# Patient Record
Sex: Male | Born: 1970 | Race: Black or African American | Hispanic: No | Marital: Married | Smoking: Never smoker
Health system: Southern US, Community
[De-identification: ages and names within clinical notes are randomized; demographics above are authoritative.]

## PROBLEM LIST (undated history)

## (undated) DIAGNOSIS — R51 Headache: Secondary | ICD-10-CM

## (undated) DIAGNOSIS — F419 Anxiety disorder, unspecified: Secondary | ICD-10-CM

## (undated) DIAGNOSIS — I499 Cardiac arrhythmia, unspecified: Secondary | ICD-10-CM

## (undated) DIAGNOSIS — J342 Deviated nasal septum: Secondary | ICD-10-CM

## (undated) DIAGNOSIS — K219 Gastro-esophageal reflux disease without esophagitis: Secondary | ICD-10-CM

## (undated) DIAGNOSIS — H9319 Tinnitus, unspecified ear: Secondary | ICD-10-CM

## (undated) DIAGNOSIS — G4733 Obstructive sleep apnea (adult) (pediatric): Secondary | ICD-10-CM

## (undated) DIAGNOSIS — T8859XA Other complications of anesthesia, initial encounter: Secondary | ICD-10-CM

## (undated) DIAGNOSIS — T4145XA Adverse effect of unspecified anesthetic, initial encounter: Secondary | ICD-10-CM

## (undated) DIAGNOSIS — R002 Palpitations: Secondary | ICD-10-CM

## (undated) DIAGNOSIS — E785 Hyperlipidemia, unspecified: Secondary | ICD-10-CM

## (undated) DIAGNOSIS — F32A Depression, unspecified: Secondary | ICD-10-CM

## (undated) DIAGNOSIS — I10 Essential (primary) hypertension: Secondary | ICD-10-CM

## (undated) DIAGNOSIS — N400 Enlarged prostate without lower urinary tract symptoms: Secondary | ICD-10-CM

## (undated) DIAGNOSIS — R519 Headache, unspecified: Secondary | ICD-10-CM

## (undated) DIAGNOSIS — F329 Major depressive disorder, single episode, unspecified: Secondary | ICD-10-CM

## (undated) DIAGNOSIS — J302 Other seasonal allergic rhinitis: Secondary | ICD-10-CM

## (undated) DIAGNOSIS — G43909 Migraine, unspecified, not intractable, without status migrainosus: Secondary | ICD-10-CM

## (undated) DIAGNOSIS — R32 Unspecified urinary incontinence: Secondary | ICD-10-CM

## (undated) DIAGNOSIS — M1990 Unspecified osteoarthritis, unspecified site: Secondary | ICD-10-CM

## (undated) HISTORY — DX: Other seasonal allergic rhinitis: J30.2

## (undated) HISTORY — DX: Essential (primary) hypertension: I10

## (undated) HISTORY — DX: Hyperlipidemia, unspecified: E78.5

## (undated) HISTORY — DX: Obstructive sleep apnea (adult) (pediatric): G47.33

## (undated) HISTORY — PX: SINOSCOPY: SHX187

## (undated) HISTORY — DX: Gastro-esophageal reflux disease without esophagitis: K21.9

## (undated) HISTORY — DX: Unspecified urinary incontinence: R32

---

## 2016-02-27 ENCOUNTER — Encounter: Payer: Self-pay | Admitting: Primary Care

## 2016-02-27 ENCOUNTER — Ambulatory Visit (INDEPENDENT_AMBULATORY_CARE_PROVIDER_SITE_OTHER): Payer: 59 | Admitting: Primary Care

## 2016-02-27 ENCOUNTER — Other Ambulatory Visit: Payer: Self-pay

## 2016-02-27 VITALS — BP 138/92 | HR 95 | Temp 98.0°F | Ht 67.0 in | Wt 181.0 lb

## 2016-02-27 DIAGNOSIS — K219 Gastro-esophageal reflux disease without esophagitis: Secondary | ICD-10-CM

## 2016-02-27 DIAGNOSIS — I10 Essential (primary) hypertension: Secondary | ICD-10-CM

## 2016-02-27 DIAGNOSIS — M2550 Pain in unspecified joint: Secondary | ICD-10-CM | POA: Insufficient documentation

## 2016-02-27 DIAGNOSIS — N400 Enlarged prostate without lower urinary tract symptoms: Secondary | ICD-10-CM

## 2016-02-27 MED ORDER — DEXLANSOPRAZOLE 60 MG PO CPDR: 60.0000 mg | DELAYED_RELEASE_CAPSULE | Freq: Every day | ORAL | 1 refills | Status: DC

## 2016-02-27 MED ORDER — LISINOPRIL-HYDROCHLOROTHIAZIDE 10-12.5 MG PO TABS: 1.0000 | ORAL_TABLET | Freq: Every day | ORAL | 2 refills | Status: DC

## 2016-02-27 NOTE — Assessment & Plan Note (Signed)
Chronic to neck, shoulders, knees. Takes amitriptyline intermittently. Will continue to monitor.

## 2016-02-27 NOTE — Progress Notes (Signed)
Subjective:    Patient ID: Ronald Burton, male    DOB: 30-Mar-1971, 45 y.o.   MRN: [id number]  HPI  Mr. Ronald Burton is a 45 year old male who presents today to establish care and discuss the problems mentioned below. Will obtain old records.   1) Essential Hypertension: Currently managed on lisinopril-HCTZ 10/12.5 mg. His blood pressure is 138/92 in the office today. He does not regularly check is blood pressure at home. He does experience occasional dizziness, chest pain, and shortness of breath. He's had a cardiac work up including stress test, echocardiogram, and blood work up in [place] which was all negative. He does experience moderate/severe esophageal reflux and is managed on PPI.  2) BPH: Diagnosed 4 years ago. Currently managed on Flomax. He will experience difficulty urinating, urgency,  nocturia and daytime frequency without his medication. He takes his medication most every night.  3) Hyperlipemia: Diagnosed several years ago through a work health screening. Currently managed on Simvastatin. He reports a normal lipid panel in 2017.  4) Neck, Shoulder, and Knee Pain: Chronic. Managed on nortirptlyine at bedtime which he takes occasionally. The nortriptyline works intermittently.  Review of Systems  Eyes: Negative for visual disturbance.  Respiratory: Negative for shortness of breath.   Cardiovascular: Negative for chest pain.  Gastrointestinal:       Moderate to severe GERD.  Genitourinary:       BPH, managed on Flomax.  Musculoskeletal: Positive for arthralgias.  Neurological: Negative for dizziness and headaches.       Past Medical History:  Diagnosis Date  . GERD (gastroesophageal reflux disease)   . Hyperlipidemia   . Hypertension   . OSA (obstructive sleep apnea)   . Seasonal allergies   . Urine incontinence      Social History   Social History  . Marital status: Married    Spouse name: N/A  . Number of children: N/A  . Years of education: N/A    Occupational History  . Not on file.   Social History Main Topics  . Smoking status: Current Some Day Smoker  . Smokeless tobacco: Not on file  . Alcohol use No  . Drug use: Unknown  . Sexual activity: Not on file   Other Topics Concern  . Not on file   Social History Narrative   Married.   6 children, 1 grandchild.   Works as a Art therapistgeneral manager   Enjoys spending time with family, video games.     No past surgical history on file.  Family History  Problem Relation Age of Onset  . Arthritis Mother   . Hyperlipidemia Mother   . Hypertension Mother   . Stroke Mother   . Diabetes Mother   . Stomach cancer Mother   . Hyperlipidemia Father   . Heart disease Father   . Hypertension Father     No Known Allergies  No current outpatient prescriptions on file prior to visit.   No current facility-administered medications on file prior to visit.     BP (!) 138/92   Pulse 95   Temp 98 F (36.7 C) (Oral)   Ht 5\' 7"  (1.702 m)   Wt 181 lb (82.1 kg)   SpO2 98%   BMI 28.35 kg/m    Objective:   Physical Exam  Constitutional: He is oriented to person, place, and time. He appears well-nourished.  Neck: Neck supple.  Cardiovascular: Normal rate and regular rhythm.   Pulmonary/Chest: Effort normal and breath sounds normal. He has  no wheezes. He has no rales.  Neurological: He is alert and oriented to person, place, and time.  Skin: Skin is warm and dry.  Psychiatric: He has a normal mood and affect.          Assessment & Plan:

## 2016-02-27 NOTE — Assessment & Plan Note (Signed)
Managed on Flomax with improvement in symptoms. Continue same.

## 2016-02-27 NOTE — Assessment & Plan Note (Signed)
Moderate. Managed on Dexilant. Will obtain records for endoscopy.

## 2016-02-27 NOTE — Assessment & Plan Note (Signed)
Managed on lisinopril/hctz. BP slightly above goal today, does feel anxious/nervous.  Refill provided today, will continue to monitor. Will obtain old records.

## 2016-02-27 NOTE — Patient Instructions (Signed)
I sent refills of your blood pressure medication to the pharmacy.  It's importance to improve your diet by reducing consumption of fast food, fried food, processed snack foods, sugary drinks. Increase consumption of fresh vegetables and fruits, whole grains, water.  Ensure you are drinking 64 ounces of water daily.  Start exercising. You should be getting 150 minutes of moderate intensity exercise weekly.  I will obtain your records and determine your next follow up.  It was a pleasure to meet you today! Please don't hesitate to call me with any questions. Welcome to [facility]!   DASH Eating Plan DASH stands for "Dietary Approaches to Stop Hypertension." The DASH eating plan is a healthy eating plan that has been shown to reduce high blood pressure (hypertension). Additional health benefits may include reducing the risk of type 2 diabetes mellitus, heart disease, and stroke. The DASH eating plan may also help with weight loss. WHAT DO I NEED TO KNOW ABOUT THE DASH EATING PLAN? For the DASH eating plan, you will follow these general guidelines:  Choose foods with a percent daily value for sodium of less than 5% (as listed on the food label).  Use salt-free seasonings or herbs instead of table salt or sea salt.  Check with your health care provider or pharmacist before using salt substitutes.  Eat lower-sodium products, often labeled as "lower sodium" or "no salt added."  Eat fresh foods.  Eat more vegetables, fruits, and low-fat dairy products.  Choose whole grains. Look for the word "whole" as the first word in the ingredient list.  Choose fish and skinless chicken or [place] more often than red meat. Limit fish, poultry, and meat to 6 oz (170 g) each day.  Limit sweets, desserts, sugars, and sugary drinks.  Choose heart-healthy fats.  Limit cheese to 1 oz (28 g) per day.  Eat more home-cooked food and less restaurant, buffet, and fast food.  Limit fried foods.  Cook foods  using methods other than frying.  Limit canned vegetables. If you do use them, rinse them well to decrease the sodium.  When eating at a restaurant, ask that your food be prepared with less salt, or no salt if possible. WHAT FOODS CAN I EAT? Seek help from a dietitian for individual calorie needs. Grains Whole grain or whole wheat bread. Brown rice. Whole grain or whole wheat pasta. Quinoa, bulgur, and whole grain cereals. Low-sodium cereals. Corn or whole wheat flour tortillas. Whole grain cornbread. Whole grain crackers. Low-sodium crackers. Vegetables Fresh or frozen vegetables (raw, steamed, roasted, or grilled). Low-sodium or reduced-sodium tomato and vegetable juices. Low-sodium or reduced-sodium tomato sauce and paste. Low-sodium or reduced-sodium canned vegetables.  Fruits All fresh, canned (in natural juice), or frozen fruits. Meat and Other Protein Products Ground beef (85% or leaner), grass-fed beef, or beef trimmed of fat. Skinless chicken or [place]. Ground chicken or [place]. Pork trimmed of fat. All fish and seafood. Eggs. Dried beans, peas, or lentils. Unsalted nuts and seeds. Unsalted canned beans. Dairy Low-fat dairy products, such as skim or 1% milk, 2% or reduced-fat cheeses, low-fat ricotta or cottage cheese, or plain low-fat yogurt. Low-sodium or reduced-sodium cheeses. Fats and Oils Tub margarines without trans fats. Light or reduced-fat mayonnaise and salad dressings (reduced sodium). Avocado. Safflower, olive, or canola oils. Natural peanut or almond butter. Other Unsalted popcorn and pretzels. The items listed above may not be a complete list of recommended foods or beverages. Contact your dietitian for more options. WHAT FOODS ARE NOT RECOMMENDED? Grains  White bread. White pasta. White rice. Refined cornbread. Bagels and croissants. Crackers that contain trans fat. Vegetables Creamed or fried vegetables. Vegetables in a cheese sauce. Regular canned vegetables.  Regular canned tomato sauce and paste. Regular tomato and vegetable juices. Fruits Dried fruits. Canned fruit in light or heavy syrup. Fruit juice. Meat and Other Protein Products Fatty cuts of meat. Ribs, chicken wings, bacon, sausage, bologna, salami, chitterlings, fatback, hot dogs, bratwurst, and packaged luncheon meats. Salted nuts and seeds. Canned beans with salt. Dairy Whole or 2% milk, cream, half-and-half, and cream cheese. Whole-fat or sweetened yogurt. Full-fat cheeses or blue cheese. Nondairy creamers and whipped toppings. Processed cheese, cheese spreads, or cheese curds. Condiments Onion and garlic salt, seasoned salt, table salt, and sea salt. Canned and packaged gravies. Worcestershire sauce. Tartar sauce. Barbecue sauce. Teriyaki sauce. Soy sauce, including reduced sodium. Steak sauce. Fish sauce. Oyster sauce. Cocktail sauce. Horseradish. Ketchup and mustard. Meat flavorings and tenderizers. Bouillon cubes. Hot sauce. Tabasco sauce. Marinades. Taco seasonings. Relishes. Fats and Oils Butter, stick margarine, lard, shortening, ghee, and bacon fat. Coconut, palm kernel, or palm oils. Regular salad dressings. Other Pickles and olives. Salted popcorn and pretzels. The items listed above may not be a complete list of foods and beverages to avoid. Contact your dietitian for more information. WHERE CAN I FIND MORE INFORMATION? National Heart, Lung, and Blood Institute: [employer].itwww.nhlbi.nih.gov/health/health-topics/topics/dash/   This information is not intended to replace advice given to you by your health care provider. Make sure you discuss any questions you have with your health care provider.   Document Released: 04/17/2011 Document Revised: 05/19/2014 Document Reviewed: 03/02/2013 Elsevier Interactive Patient Education [employer]2016 Elsevier Inc.

## 2016-02-27 NOTE — Telephone Encounter (Signed)
Misty StanleyLisa DPR signed left v/m that pt was seen today and pt forgot to request refill dexilant to [facility]. Mayra ReelKate Clark NP has not prescribed before.Please advise. Lisa request cb when done.

## 2016-02-27 NOTE — Progress Notes (Signed)
Pre visit review using our clinic review tool, if applicable. No additional management support is needed unless otherwise documented below in the visit note. 

## 2016-03-03 NOTE — Telephone Encounter (Signed)
Spoken to patient's spouse on 02/29/2016 and was notified that prescription has been refilled.

## 2016-03-04 ENCOUNTER — Other Ambulatory Visit: Payer: Self-pay | Admitting: Primary Care

## 2016-03-04 ENCOUNTER — Telehealth: Payer: Self-pay

## 2016-03-04 DIAGNOSIS — K219 Gastro-esophageal reflux disease without esophagitis: Secondary | ICD-10-CM

## 2016-03-04 DIAGNOSIS — I10 Essential (primary) hypertension: Secondary | ICD-10-CM

## 2016-03-04 MED ORDER — LISINOPRIL-HYDROCHLOROTHIAZIDE 10-12.5 MG PO TABS: 1.0000 | ORAL_TABLET | Freq: Every day | ORAL | 1 refills | Status: DC

## 2016-03-04 MED ORDER — DEXLANSOPRAZOLE 60 MG PO CPDR: 60.0000 mg | DELAYED_RELEASE_CAPSULE | Freq: Every day | ORAL | 1 refills | Status: DC

## 2016-03-04 NOTE — Telephone Encounter (Signed)
Received faxed refill request for dexlansoprazole (DEXILANT) 60 MG capsule and lisinopril-hydrochlorothiazide (PRINZIDE,ZESTORETIC) 10-12.5 to change to mail order.  Sent medication as requested.

## 2016-03-04 NOTE — Telephone Encounter (Signed)
Ronald Burton with [facility] R/X calls regarding clarification on R/X's sent in by Vernona RiegerKatherine Clark NP.    In order to fill the Lis/hctz and Dexilant for patient they must have the name of NP's collaborating physician on file.  Information (dr. Kerby NoraAmy Bedsole) provided by this writer and r/x's are being filled.

## 2016-03-27 ENCOUNTER — Telehealth: Payer: Self-pay | Admitting: Primary Care

## 2016-03-27 NOTE — Telephone Encounter (Signed)
Patient advised.

## 2016-03-27 NOTE — Telephone Encounter (Signed)
Please notify patient that I received his records and he's due for his annual physical anytime now. Please schedule this at his convenience.

## 2016-05-29 ENCOUNTER — Other Ambulatory Visit: Payer: Self-pay | Admitting: Primary Care

## 2016-05-29 DIAGNOSIS — I10 Essential (primary) hypertension: Secondary | ICD-10-CM

## 2016-05-29 DIAGNOSIS — K219 Gastro-esophageal reflux disease without esophagitis: Secondary | ICD-10-CM

## 2016-06-05 NOTE — Telephone Encounter (Signed)
Sending letter with results and Kate's comments for patient.  Message left for patient to return my call.

## 2016-08-22 ENCOUNTER — Ambulatory Visit (INDEPENDENT_AMBULATORY_CARE_PROVIDER_SITE_OTHER): Payer: 59 | Admitting: Podiatry

## 2016-08-22 ENCOUNTER — Encounter: Payer: Self-pay | Admitting: Podiatry

## 2016-08-22 DIAGNOSIS — B351 Tinea unguium: Secondary | ICD-10-CM

## 2016-08-22 NOTE — Progress Notes (Signed)
   Subjective:    Patient ID: Ronald Burton, male    DOB: 12/22/1970, 46 y.o.   MRN: [id number]  HPI  Chief Complaint  Patient presents with  . Nail Problem    Bilateral; great toe and 5th toes; nail discoloration & thick nails; pt stated, "needs nails checked for nail fungus"; x2 yrs     Review of Systems  Musculoskeletal: Positive for arthralgias.  All other systems reviewed and are negative.      Objective:   Physical Exam        Assessment & Plan:

## 2016-08-24 NOTE — Progress Notes (Signed)
Subjective:     Patient ID: Ronald Burton, male   DOB: Jul 31, 1970, 46 y.o.   MRN: [id number]  HPI patient presents with significant nail disease bilateral hallux and several other nails that are dystrophic and thick   Review of Systems  All other systems reviewed and are negative.      Objective:   Physical Exam  Constitutional: He is oriented to person, place, and time.  Musculoskeletal: Normal range of motion.  Neurological: He is oriented to person, place, and time.  Skin: Skin is warm and dry.  Nursing note and vitals reviewed.  neurovascular status found to be intact with muscle strength adequate range of motion within normal limits. Patient's found to have nail disease especially the hallux bilateral that are thick and dystrophic when palpated. 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 I then recommended we do a culture and we did carefully debride nailbeds hallux bilateral and I'm sending it for culture and biopsy. I then discussed the possibilities for oral topical and laser treatment depending on response to culture

## 2016-09-19 ENCOUNTER — Ambulatory Visit (INDEPENDENT_AMBULATORY_CARE_PROVIDER_SITE_OTHER): Payer: 59 | Admitting: Podiatry

## 2016-09-19 DIAGNOSIS — B351 Tinea unguium: Secondary | ICD-10-CM

## 2016-09-19 MED ORDER — TERBINAFINE HCL 250 MG PO TABS: 250.0000 mg | ORAL_TABLET | Freq: Every day | ORAL | 0 refills | Status: DC

## 2016-09-19 NOTE — Progress Notes (Signed)
Subjective:    Patient ID: Ronald Burton, male   DOB: 46 y.o.   MRN: [id number]   HPI patient has changes in his hallux nails bilateral and fifth nails bilateral that are significant and he had cultures done    ROS      Objective:  Physical Exam Neurovascular status intact with patient found have positive cultures indicating fungal component to the deformity of the nailbeds that he has    Assessment:    Combination of mycosis with probable trauma to the nailbeds     Plan:     H&P and condition reviewed at great length. At this point I do think aggressive oral therapy along with topical therapy and laser is the best chance he has of improving and I reviewed what would be necessary. Patient wants to undergo this and he is scheduled for laser therapy of at least 4 treatments is placed on 90 days Lamisil will have liver function studies done and also we will begin topical. No guarantee this will solve his problem

## 2016-11-26 ENCOUNTER — Other Ambulatory Visit: Payer: Self-pay | Admitting: Primary Care

## 2016-11-26 DIAGNOSIS — E559 Vitamin D deficiency, unspecified: Secondary | ICD-10-CM

## 2016-11-26 DIAGNOSIS — E785 Hyperlipidemia, unspecified: Secondary | ICD-10-CM

## 2016-12-01 ENCOUNTER — Other Ambulatory Visit (INDEPENDENT_AMBULATORY_CARE_PROVIDER_SITE_OTHER): Payer: 59

## 2016-12-01 DIAGNOSIS — E785 Hyperlipidemia, unspecified: Secondary | ICD-10-CM

## 2016-12-01 DIAGNOSIS — E559 Vitamin D deficiency, unspecified: Secondary | ICD-10-CM | POA: Diagnosis not present

## 2016-12-01 LAB — COMPREHENSIVE METABOLIC PANEL
ALT: 24 U/L (ref 0–53)
AST: 16 U/L (ref 0–37)
Albumin: 4.3 g/dL (ref 3.5–5.2)
Alkaline Phosphatase: 73 U/L (ref 39–117)
BILIRUBIN TOTAL: 1.1 mg/dL (ref 0.2–1.2)
BUN: 8 mg/dL (ref 6–23)
CALCIUM: 9.7 mg/dL (ref 8.4–10.5)
CHLORIDE: 105 meq/L (ref 96–112)
CO2: 26 mEq/L (ref 19–32)
Creatinine, Ser: 1.03 mg/dL (ref 0.40–1.50)
GFR: 99.28 mL/min (ref >60.00)
GLUCOSE: 107 mg/dL — AB (ref 70–99)
POTASSIUM: 3.7 meq/L (ref 3.5–5.1)
Sodium: 137 mEq/L (ref 135–145)
Total Protein: 7 g/dL (ref 6.0–8.3)

## 2016-12-01 LAB — LIPID PANEL
CHOL/HDL RATIO: 3
CHOLESTEROL: 129 mg/dL (ref 0–200)
HDL: 41.2 mg/dL (ref >39.00)
LDL CALC: 77 mg/dL (ref 0–99)
NonHDL: 87.37
TRIGLYCERIDES: 54 mg/dL (ref 0.0–149.0)
VLDL: 10.8 mg/dL (ref 0.0–40.0)

## 2016-12-01 LAB — VITAMIN D 25 HYDROXY (VIT D DEFICIENCY, FRACTURES): VITD: 27.95 ng/mL — ABNORMAL LOW (ref 30.00–100.00)

## 2016-12-01 LAB — HEMOGLOBIN A1C: Hgb A1c MFr Bld: 5.3 % (ref 4.6–6.5)

## 2016-12-04 ENCOUNTER — Encounter: Payer: 59 | Admitting: Primary Care

## 2016-12-24 ENCOUNTER — Ambulatory Visit (INDEPENDENT_AMBULATORY_CARE_PROVIDER_SITE_OTHER): Payer: 59 | Admitting: Primary Care

## 2016-12-24 ENCOUNTER — Encounter: Payer: Self-pay | Admitting: Primary Care

## 2016-12-24 VITALS — BP 124/94 | HR 81 | Temp 97.9°F | Ht 67.0 in | Wt 179.1 lb

## 2016-12-24 DIAGNOSIS — Z0000 Encounter for general adult medical examination without abnormal findings: Secondary | ICD-10-CM | POA: Insufficient documentation

## 2016-12-24 DIAGNOSIS — N400 Enlarged prostate without lower urinary tract symptoms: Secondary | ICD-10-CM | POA: Diagnosis not present

## 2016-12-24 DIAGNOSIS — K219 Gastro-esophageal reflux disease without esophagitis: Secondary | ICD-10-CM | POA: Diagnosis not present

## 2016-12-24 DIAGNOSIS — I10 Essential (primary) hypertension: Secondary | ICD-10-CM

## 2016-12-24 DIAGNOSIS — J309 Allergic rhinitis, unspecified: Secondary | ICD-10-CM

## 2016-12-24 DIAGNOSIS — M2550 Pain in unspecified joint: Secondary | ICD-10-CM

## 2016-12-24 DIAGNOSIS — E785 Hyperlipidemia, unspecified: Secondary | ICD-10-CM | POA: Insufficient documentation

## 2016-12-24 NOTE — Assessment & Plan Note (Signed)
Stable on Dexilant, also taking Zantac HS. Continue same.

## 2016-12-24 NOTE — Assessment & Plan Note (Addendum)
Recently initiated on Singulair, Claritin, and Flonase per [facility].

## 2016-12-24 NOTE — Assessment & Plan Note (Signed)
Immunizations UTD. Recommended regular exercise, increase veggies/fruit/whole grains, water. Exam unremarkable. Labs unremarkable. Follow up in 1 year.

## 2016-12-24 NOTE — Assessment & Plan Note (Signed)
Managed on tamsulosin PRN through the [facility].

## 2016-12-24 NOTE — Progress Notes (Signed)
Subjective:    Patient ID: Ronald Burton, male    DOB: 02/25/1971, 46 y.o.   MRN: [id number]  HPI  Ronald Burton is a 46 year old male who presents today for complete physical. Following with [facility] [place]   Immunizations: -Tetanus: Completed in 2015 -Influenza: Due this season   Diet: He endorses a fair diet. Breakfast: Skips Lunch: Fast food Dinner: Meat, vegetable, salad, starch Snacks: Crackers Desserts: Chocolate (daily). Beverages: Coffee, occasional soda, water  Exercise: He does not currently exercise.  Eye exam: Completed in 2017. Dental exam: Completes annually   Review of Systems  Constitutional: Negative for unexpected weight change.  HENT: Negative for rhinorrhea.   Respiratory: Negative for cough and shortness of breath.   Cardiovascular: Negative for chest pain.  Gastrointestinal: Negative for constipation and diarrhea.  Genitourinary: Negative for difficulty urinating.  Musculoskeletal: Positive for arthralgias. Negative for myalgias.       Chronic arthralgias to knees and neck  Skin: Negative for rash.  Allergic/Immunologic: Negative for environmental allergies.  Neurological: Negative for dizziness, numbness and headaches.  Psychiatric/Behavioral:       [facility]ased [facility]. Increased stress with work and ill family member.       Past Medical History:  Diagnosis Date  . GERD (gastroesophageal reflux disease)   . Hyperlipidemia   . Hypertension   . OSA (obstructive sleep apnea)   . Seasonal allergies   . Urine incontinence      Social History   Social History  . Marital status: Married    Spouse name: N/A  . Number of children: N/A  . Years of education: N/A   Occupational History  . Not on file.   Social History Main Topics  . Smoking status: Current Some Day Smoker  . Smokeless tobacco: Current User    Types: Chew  . Alcohol use No  . Drug use: Unknown  . Sexual activity: Not on file   Other Topics Concern  . Not  on file   Social History Narrative   Married.   6 children, 1 grandchild.   Works as a Art therapist   Enjoys spending time with family, video games.     No past surgical history on file.  Family History  Problem Relation Age of Onset  . Arthritis Mother   . Hyperlipidemia Mother   . Hypertension Mother   . Stroke Mother   . Diabetes Mother   . Stomach cancer Mother   . Hyperlipidemia Father   . Heart disease Father   . Hypertension Father     No Known Allergies  Current Outpatient Prescriptions on File Prior to Visit  Medication Sig Dispense Refill  . DEXILANT 60 MG capsule TAKE 1 CAPSULE BY MOUTH  DAILY 90 capsule 1  . diclofenac sodium (VOLTAREN) 1 % GEL Apply 2 g topically daily.     . fluticasone (FLONASE) 50 MCG/ACT nasal spray Place 1 spray into both nostrils daily.    . simvastatin (ZOCOR) 20 MG tablet Take 20 mg by mouth daily.    Marland Kitchen terbinafine (LAMISIL) 250 MG tablet Take 1 tablet (250 mg total) by mouth daily. 90 tablet 0  . lisinopril-hydrochlorothiazide (PRINZIDE,ZESTORETIC) 10-12.5 MG tablet TAKE 1 TABLET BY MOUTH  DAILY (Patient not taking: Reported on 12/24/2016) 90 tablet 1   No current facility-administered medications on file prior to visit.     BP (!) 124/94   Pulse 81   Temp 97.9 F (36.6 C) (Oral)   Ht 5'  7" (1.702 m)   Wt 179 lb 1.9 oz (81.2 kg)   SpO2 98%   BMI 28.05 kg/m    Objective:   Physical Exam  Constitutional: He is oriented to person, place, and time. He appears well-nourished.  HENT:  Right Ear: Tympanic membrane and ear canal normal.  Left Ear: Tympanic membrane and ear canal normal.  Nose: Nose normal. Right sinus exhibits no maxillary sinus tenderness and no frontal sinus tenderness. Left sinus exhibits no maxillary sinus tenderness and no frontal sinus tenderness.  Mouth/Throat: Oropharynx is clear and moist.  Eyes: Pupils are equal, round, and reactive to light. Conjunctivae and EOM are normal.  Neck: Neck supple.  Carotid bruit is not present. No thyromegaly present.  Cardiovascular: Normal rate, regular rhythm and normal heart sounds.   Pulmonary/Chest: Effort normal and breath sounds normal. He has no wheezes. He has no rales.  Abdominal: Soft. Bowel sounds are normal. There is no tenderness.  Musculoskeletal: Normal range of motion.  Neurological: He is alert and oriented to person, place, and time. He has normal reflexes. No cranial nerve deficit.  Skin: Skin is warm and dry.  Psychiatric: He has a normal mood and affect.          Assessment & Plan:

## 2016-12-24 NOTE — Patient Instructions (Signed)
It's important to improve your diet by reducing consumption of fast food, fried food, processed snack foods, sugary drinks. Increase consumption of fresh vegetables and fruits, whole grains, water.  Ensure you are drinking 64 ounces of water daily.  Start exercising. You should be getting 150 minutes of moderate intensity exercise weekly.  Follow up in 1 year for your annual physical or sooner if needed.  It was a pleasure to see you today!  

## 2016-12-24 NOTE — Assessment & Plan Note (Signed)
Stable on simvastatin 20 mg. Recent lipids unremarkable. Recommended regular exercise and improvement on diet.

## 2016-12-24 NOTE — Assessment & Plan Note (Signed)
Increased tension to neck and shoulders, likely secondary to stress. He will be working on stress reduction through [facility].

## 2016-12-24 NOTE — Assessment & Plan Note (Signed)
Has had several low BP readings. He plans on working on diet and exercise. Recommended we separate his lisinopril-HCTZ, he declines as he'd like to stay off and work on weight loss. He will monitor his BP.

## 2017-01-13 ENCOUNTER — Encounter: Payer: Self-pay | Admitting: Emergency Medicine

## 2017-01-13 DIAGNOSIS — R079 Chest pain, unspecified: Secondary | ICD-10-CM | POA: Insufficient documentation

## 2017-01-13 DIAGNOSIS — R531 Weakness: Secondary | ICD-10-CM | POA: Insufficient documentation

## 2017-01-13 DIAGNOSIS — R0602 Shortness of breath: Secondary | ICD-10-CM | POA: Insufficient documentation

## 2017-01-13 DIAGNOSIS — Z5321 Procedure and treatment not carried out due to patient leaving prior to being seen by health care provider: Secondary | ICD-10-CM | POA: Diagnosis not present

## 2017-01-13 DIAGNOSIS — R42 Dizziness and giddiness: Secondary | ICD-10-CM | POA: Diagnosis not present

## 2017-01-13 DIAGNOSIS — R110 Nausea: Secondary | ICD-10-CM | POA: Diagnosis not present

## 2017-01-13 MED ORDER — ONDANSETRON 4 MG PO TBDP
4.0000 mg | ORAL_TABLET | Freq: Once | ORAL | Status: AC
  Administered 2017-01-13: 4 mg via ORAL
  Filled 2017-01-13: qty 1

## 2017-01-13 NOTE — ED Triage Notes (Signed)
Patient to ER for c/o chest pain with radiating to left arm and jaw that started tonight. Also reports shortness of breath, dizziness, weakness, and nausea. Denies any recent illness.

## 2017-01-14 ENCOUNTER — Emergency Department: Payer: 59

## 2017-01-14 ENCOUNTER — Other Ambulatory Visit: Payer: Self-pay

## 2017-01-14 ENCOUNTER — Telehealth: Payer: Self-pay | Admitting: Emergency Medicine

## 2017-01-14 ENCOUNTER — Encounter (HOSPITAL_COMMUNITY): Payer: Self-pay | Admitting: *Deleted

## 2017-01-14 ENCOUNTER — Ambulatory Visit: Payer: 59 | Admitting: Family Medicine

## 2017-01-14 ENCOUNTER — Emergency Department (HOSPITAL_COMMUNITY)
Admission: EM | Admit: 2017-01-14 | Discharge: 2017-01-14 | Disposition: A | Discharge status: Home / Self Care | Payer: 59 | Attending: Emergency Medicine | Admitting: Emergency Medicine

## 2017-01-14 ENCOUNTER — Emergency Department
Admission: EM | Admit: 2017-01-14 | Discharge: 2017-01-14 | Disposition: A | Discharge status: Home / Self Care | Payer: 59 | Attending: Emergency Medicine | Admitting: Emergency Medicine

## 2017-01-14 ENCOUNTER — Telehealth: Payer: Self-pay | Admitting: Primary Care

## 2017-01-14 DIAGNOSIS — E785 Hyperlipidemia, unspecified: Secondary | ICD-10-CM | POA: Diagnosis not present

## 2017-01-14 DIAGNOSIS — Z79899 Other long term (current) drug therapy: Secondary | ICD-10-CM | POA: Insufficient documentation

## 2017-01-14 DIAGNOSIS — R072 Precordial pain: Secondary | ICD-10-CM | POA: Diagnosis not present

## 2017-01-14 DIAGNOSIS — R079 Chest pain, unspecified: Secondary | ICD-10-CM | POA: Diagnosis present

## 2017-01-14 DIAGNOSIS — Z7982 Long term (current) use of aspirin: Secondary | ICD-10-CM | POA: Insufficient documentation

## 2017-01-14 DIAGNOSIS — R42 Dizziness and giddiness: Secondary | ICD-10-CM

## 2017-01-14 DIAGNOSIS — R110 Nausea: Secondary | ICD-10-CM | POA: Diagnosis not present

## 2017-01-14 DIAGNOSIS — R55 Syncope and collapse: Secondary | ICD-10-CM | POA: Diagnosis not present

## 2017-01-14 DIAGNOSIS — F17200 Nicotine dependence, unspecified, uncomplicated: Secondary | ICD-10-CM | POA: Insufficient documentation

## 2017-01-14 DIAGNOSIS — I10 Essential (primary) hypertension: Secondary | ICD-10-CM | POA: Insufficient documentation

## 2017-01-14 HISTORY — DX: Cardiac arrhythmia, unspecified: I49.9

## 2017-01-14 LAB — COMPREHENSIVE METABOLIC PANEL
ALBUMIN: 4.1 g/dL (ref 3.5–5.0)
ALK PHOS: 77 U/L (ref 38–126)
ALT: 20 U/L (ref 17–63)
ANION GAP: 9 (ref 5–15)
AST: 19 U/L (ref 15–41)
BUN: 8 mg/dL (ref 6–20)
CALCIUM: 9.4 mg/dL (ref 8.9–10.3)
CO2: 25 mmol/L (ref 22–32)
Chloride: 104 mmol/L (ref 101–111)
Creatinine, Ser: <0.3 mg/dL — ABNORMAL LOW (ref 0.61–1.24)
GLUCOSE: 93 mg/dL (ref 65–99)
Potassium: 4.1 mmol/L (ref 3.5–5.1)
SODIUM: 138 mmol/L (ref 135–145)
Total Bilirubin: 1.2 mg/dL (ref 0.3–1.2)
Total Protein: 7 g/dL (ref 6.5–8.1)

## 2017-01-14 LAB — BASIC METABOLIC PANEL
ANION GAP: 7 (ref 5–15)
BUN: 9 mg/dL (ref 6–20)
CALCIUM: 9.4 mg/dL (ref 8.9–10.3)
CHLORIDE: 106 mmol/L (ref 101–111)
CO2: 25 mmol/L (ref 22–32)
Creatinine, Ser: 1.1 mg/dL (ref 0.61–1.24)
GFR calc Af Amer: >60 mL/min (ref >60)
GFR calc non Af Amer: >60 mL/min (ref >60)
GLUCOSE: 130 mg/dL — AB (ref 65–99)
Potassium: 3.6 mmol/L (ref 3.5–5.1)
Sodium: 138 mmol/L (ref 135–145)

## 2017-01-14 LAB — CBC WITH DIFFERENTIAL/PLATELET
BASOS ABS: 0 10*3/uL (ref 0.0–0.1)
BASOS PCT: 0 %
EOS ABS: 0.5 10*3/uL (ref 0.0–0.7)
Eosinophils Relative: 4 %
HEMATOCRIT: 44.7 % (ref 39.0–52.0)
HEMOGLOBIN: 15.7 g/dL (ref 13.0–17.0)
Lymphocytes Relative: 26 %
Lymphs Abs: 3.3 10*3/uL (ref 0.7–4.0)
MCH: 28.3 pg (ref 26.0–34.0)
MCHC: 35.1 g/dL (ref 30.0–36.0)
MCV: 80.5 fL (ref 78.0–100.0)
MONOS PCT: 8 %
Monocytes Absolute: 1 10*3/uL (ref 0.1–1.0)
NEUTROS ABS: 7.8 10*3/uL — AB (ref 1.7–7.7)
NEUTROS PCT: 62 %
Platelets: 282 10*3/uL (ref 150–400)
RBC: 5.55 MIL/uL (ref 4.22–5.81)
RDW: 13.1 % (ref 11.5–15.5)
WBC: 12.6 10*3/uL — AB (ref 4.0–10.5)

## 2017-01-14 LAB — I-STAT TROPONIN, ED: TROPONIN I, POC: 0.01 ng/mL (ref 0.00–0.08)

## 2017-01-14 LAB — CBC
HEMATOCRIT: 43.5 % (ref 40.0–52.0)
HEMOGLOBIN: 15 g/dL (ref 13.0–18.0)
MCH: 28 pg (ref 26.0–34.0)
MCHC: 34.5 g/dL (ref 32.0–36.0)
MCV: 81 fL (ref 80.0–100.0)
Platelets: 291 10*3/uL (ref 150–440)
RBC: 5.37 MIL/uL (ref 4.40–5.90)
RDW: 13.3 % (ref 11.5–14.5)
WBC: 15.4 10*3/uL — ABNORMAL HIGH (ref 3.8–10.6)

## 2017-01-14 LAB — TROPONIN I: Troponin I: <0.03 ng/mL (ref <0.03)

## 2017-01-14 MED ORDER — ONDANSETRON HCL 4 MG/2ML IJ SOLN
4.0000 mg | Freq: Once | INTRAMUSCULAR | Status: AC
  Administered 2017-01-14: 4 mg via INTRAVENOUS
  Filled 2017-01-14: qty 2

## 2017-01-14 MED ORDER — SODIUM CHLORIDE 0.9 % IV SOLN
INTRAVENOUS | Status: DC
Start: 2017-01-14 — End: 2017-01-14

## 2017-01-14 MED ORDER — MECLIZINE HCL 25 MG PO TABS
25.0000 mg | ORAL_TABLET | Freq: Once | ORAL | Status: AC
  Administered 2017-01-14: 25 mg via ORAL
  Filled 2017-01-14: qty 1

## 2017-01-14 MED ORDER — MECLIZINE HCL 25 MG PO TABS: 25.0000 mg | ORAL_TABLET | Freq: Three times a day (TID) | ORAL | 0 refills | Status: DC | PRN

## 2017-01-14 MED ORDER — PROMETHAZINE HCL 25 MG PO TABS
25.0000 mg | ORAL_TABLET | Freq: Four times a day (QID) | ORAL | 1 refills | Status: DC | PRN
Start: 2017-01-14 — End: 2018-08-23

## 2017-01-14 MED ORDER — ONDANSETRON 4 MG PO TBDP
4.0000 mg | ORAL_TABLET | Freq: Three times a day (TID) | ORAL | 1 refills | Status: DC | PRN
Start: 2017-01-14 — End: 2018-06-08

## 2017-01-14 MED ORDER — SODIUM CHLORIDE 0.9 % IV BOLUS (SEPSIS)
1000.0000 mL | Freq: Once | INTRAVENOUS | Status: AC
  Administered 2017-01-14: 1000 mL via INTRAVENOUS

## 2017-01-14 NOTE — Telephone Encounter (Signed)
Reviewed chart, looks like troponin negative. Chest xray with "mild bronchitic changes". Given WBC count and xray result he could have bacterial respiratory involvement. Will have patient evaluated by MD in the office today as he needs examination.

## 2017-01-14 NOTE — Telephone Encounter (Signed)
While working with providers to determine most appropriate evaluation plan for patient since initial ER refusal, I notice in EPIC that patient has arrived back at ER at 1:16pm today per [facility]'s original recommendation.

## 2017-01-14 NOTE — Telephone Encounter (Signed)
Strongly recommended evaluation by anyone rather ED or in our office. There is a time held for evaluation at 5 pm in our office. Our RN leader is working to get him seen today.

## 2017-01-14 NOTE — ED Notes (Signed)
Patient verbalized understanding of discharge instructions and denies any further needs or questions at this time. VS stable. Patient ambulatory with steady gait, states his dizziness had improved. RN escorted patient to ED entrance.

## 2017-01-14 NOTE — Telephone Encounter (Signed)
spouse called for an ER follow up appointment at our office [facility] today at 5:00 pm with Dr, Reece AgarG.   Since patient was not actually seen at the ER, checked with triage and I called pt to ask if he would please speak with [facility] and be triaged for SOB and chest pain.  Patient agreed to speak with [facility].  Canceled appointment with our office per policy until after he has been triaged.  Placed a hold on time for him if needed.  Best number to call patient is [phone]

## 2017-01-14 NOTE — ED Triage Notes (Addendum)
Pt in c/o mid radiating CP, pain goes to the neck, pt reports nausea, denies, v/d., pt reports hx of irregular heart beat, pt c/o SOB, pt c/o dizziness, A&O x4, pt seen at [facility] for same complaint last night & LWBS after triage

## 2017-01-14 NOTE — Discharge Instructions (Signed)
Take either the Phenergan or the Zofran for the nausea. Take the antivert for the dizziness. 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 For the chest pain although workup was negative here recommend follow-up with cardiology.: Make an appointment. Return for any new or worse symptoms.

## 2017-01-14 NOTE — Telephone Encounter (Signed)
Per chart review tab pt went to [facility] ED today. 

## 2017-01-14 NOTE — ED Provider Notes (Signed)
[facility]-EMERGENCY DEPT Provider Note   CSN: [id number] Arrival date & time: 01/14/17  1319     History   Chief Complaint Chief Complaint  Patient presents with  . Chest Pain    HPI Ronald Burton is a 46 y.o. male.  A patient with onset of substernal chest pain yesterday afternoon. It persisted into today. Patient went to [facility] for evaluation did have lab work done there but the wait was too long so he had a leave for work. Today at work patient was feeling bad symptoms have included dizziness without true room spinning feeling like his current a pass out nausea but no vomiting. Some slight shortness of breath but no severe shortness of breath. No history of similar symptoms in the past. No upper respiratory symptoms. No vomiting no diarrhea. No actual syncope.  Chest pain is substernal radiates to the left neck and left arm associated with some tingling and numbness.      Past Medical History:  Diagnosis Date  . GERD (gastroesophageal reflux disease)   . Hyperlipidemia   . Hypertension   . Irregular heart beat   . OSA (obstructive sleep apnea)   . Seasonal allergies   . Urine incontinence     Patient Active Problem List   Diagnosis Date Noted  . Allergic rhinitis 12/24/2016  . Hyperlipidemia 12/24/2016  . Preventative health care 12/24/2016  . Essential hypertension 02/27/2016  . BPH (benign prostatic hyperplasia) 02/27/2016  . Arthralgia 02/27/2016  . GERD (gastroesophageal reflux disease) 02/27/2016    History reviewed. No pertinent surgical history.     Home Medications    Prior to Admission medications   Medication Sig Start Date End Date Taking? Authorizing Provider  aspirin EC 81 MG tablet Take 81 mg by mouth daily.    Yes [provider]  cetirizine (ZYRTEC) 10 MG tablet Take 10 mg by mouth daily.   Yes [provider]  DEXILANT 60 MG capsule TAKE 1 CAPSULE BY MOUTH  DAILY Patient taking differently: Take 60 mg by  mouth once a day 05/30/16  Yes Doreene Nest, NP  diclofenac sodium (VOLTAREN) 1 % GEL Apply 2-4 g topically daily as needed (for knee pain).  06/27/16  Yes [provider]  fluticasone (FLONASE) 50 MCG/ACT nasal spray Place 1 spray into both nostrils 2 (two) times daily as needed for allergies or rhinitis.    Yes [provider]  montelukast (SINGULAIR) 10 MG tablet Take 10 mg by mouth at bedtime.   Yes [provider]  simvastatin (ZOCOR) 20 MG tablet Take 20 mg by mouth every evening.    Yes [provider]  topiramate (TOPAMAX) 25 MG tablet Take 25 mg by mouth daily as needed (at onset of headaches).    Yes [provider]  UNABLE TO FIND CPAP: At bedtime   Yes [provider]  lisinopril-hydrochlorothiazide (PRINZIDE,ZESTORETIC) 10-12.5 MG tablet TAKE 1 TABLET BY MOUTH  DAILY Patient not taking: Reported on 01/14/2017 05/30/16   Doreene Nest, NP  meclizine (ANTIVERT) 25 MG tablet Take 1 tablet (25 mg total) by mouth 3 (three) times daily as needed for dizziness. 01/14/17   Vanetta Mulders, MD  ondansetron (ZOFRAN ODT) 4 MG disintegrating tablet Take 1 tablet (4 mg total) by mouth every 8 (eight) hours as needed for nausea or vomiting. 01/14/17   Vanetta Mulders, MD  promethazine (PHENERGAN) 25 MG tablet Take 1 tablet (25 mg total) by mouth every 6 (six) hours as needed for  nausea or vomiting. 01/14/17   Vanetta Mulders, MD  terbinafine (LAMISIL) 250 MG tablet Take 1 tablet (250 mg total) by mouth daily. Patient not taking: Reported on 01/14/2017 09/19/16   Lenn Sink, DPM    Family History Family History  Problem Relation Age of Onset  . Arthritis Mother   . Hyperlipidemia Mother   . Hypertension Mother   . Stroke Mother   . Diabetes Mother   . Stomach cancer Mother   . Hyperlipidemia Father   . Heart disease Father   . Hypertension Father     Social History Social History  Substance Use Topics  . Smoking status: Current  Some Day Smoker  . Smokeless tobacco: Current User    Types: Chew  . Alcohol use No     Allergies   Paxil [paroxetine]   Review of Systems Review of Systems  Constitutional: Positive for fatigue. Negative for fever.  HENT: Negative for congestion.   Eyes: Negative for visual disturbance.  Respiratory: Positive for shortness of breath.   Cardiovascular: Positive for chest pain.  Gastrointestinal: Negative for abdominal pain, nausea and vomiting.  Genitourinary: Negative for dysuria.  Musculoskeletal: Negative for myalgias.  Skin: Negative for rash.  Neurological: Positive for dizziness and light-headedness. Negative for syncope and headaches.  Hematological: Does not bruise/bleed easily.  Psychiatric/Behavioral: Negative for confusion.     Physical Exam Updated Vital Signs BP (!) 138/95   Pulse 77   Temp 98 F (36.7 C)   Resp 15   Ht 1.753 m (5\' 9" )   Wt 78.5 kg (173 lb)   SpO2 98%   BMI 25.55 kg/m   Physical Exam  Constitutional: He is oriented to person, place, and time. He appears well-developed and well-nourished. No distress.  HENT:  Head: Normocephalic and atraumatic.  Mouth/Throat: Oropharynx is clear and moist.  Eyes: Pupils are equal, round, and reactive to light. Conjunctivae and EOM are normal.  Neck: Normal range of motion. Neck supple.  Cardiovascular: Normal rate, regular rhythm and normal heart sounds.   Pulmonary/Chest: Effort normal and breath sounds normal.  Abdominal: Soft. Bowel sounds are normal.  Musculoskeletal: Normal range of motion.  Neurological: He is alert and oriented to person, place, and time. No cranial nerve deficit or sensory deficit. He exhibits normal muscle tone. Coordination normal.  Skin: Skin is warm.  Nursing note and vitals reviewed.    ED Treatments / Results  Labs (all labs ordered are listed, but only abnormal results are displayed) Labs Reviewed  CBC WITH DIFFERENTIAL/PLATELET - Abnormal; Notable for the  following:       Result Value   WBC 12.6 (*)    Neutro Abs 7.8 (*)    All other components within normal limits  COMPREHENSIVE METABOLIC PANEL - Abnormal; Notable for the following:    Creatinine, Ser <0.30 (*)    All other components within normal limits  I-STAT TROPONIN, ED    EKG  EKG Interpretation  Date/Time:  Wednesday January 14 2017 16:01:18 EDT Ventricular Rate:  79 PR Interval:    QRS Duration: 80 QT Interval:  358 QTC Calculation: 411 R Axis:   83 Text Interpretation:  Sinus rhythm ST elevation, consider lateral injury No significant change since 01/13/17 Confirmed by Vanetta Mulders [phone]) on 01/14/2017 4:28:09 PM      ED ECG REPORT   Date: 01/14/2017  Rate: 79  Rhythm: normal sinus rhythm  QRS Axis: normal  Intervals: normal  ST/T Wave abnormalities: nonspecific ST changes  Conduction  Disutrbances:none  Narrative Interpretation:   Old EKG Reviewed: none available  I have personally reviewed the EKG tracing and agree with the computerized printout as noted.   Radiology Dg Chest 2 View  Result Date: 01/14/2017 CLINICAL DATA:  Chest pain radiating to the jaw and left arm. EXAM: CHEST  2 VIEW COMPARISON:  None. FINDINGS: The cardiomediastinal contours are normal. Mild central bronchitic change. Pulmonary vasculature is normal. No consolidation, pleural effusion, or pneumothorax. No acute osseous abnormalities are seen. IMPRESSION: Mild bronchitic change. Electronically Signed   By: Rubye OaksMelanie  Ehinger M.D.   On: 01/14/2017 00:45    Procedures Procedures (including critical care time)  Medications Ordered in ED Medications  0.9 %  sodium chloride infusion (not administered)  meclizine (ANTIVERT) tablet 25 mg (not administered)  sodium chloride 0.9 % bolus 1,000 mL (0 mLs Intravenous Stopped 01/14/17 1728)  ondansetron (ZOFRAN) injection 4 mg (4 mg Intravenous Given 01/14/17 1608)     Initial Impression / Assessment and Plan / ED Course  I have reviewed the  triage vital signs and the nursing notes.  Pertinent labs & imaging results that were available during my care of the patient were reviewed by me and considered in my medical decision making (see chart for details).     The patient's lab workup here without significant changes compared to his visit [place] yesterday. Still has a mild elevation in the white blood cell count. Has a normal differential. Suspect symptoms could very well be viral base. There is a component of dizziness no true vertigo there is component of nausea. From the chest pain standpoint since he's had chest discomfort sort of nonstop since yesterday afternoon troponins being negative last evening and being negative again today makes it highly unlikely there is significant heart event.  Patient will be discharged home with anti-GERD and antinausea medicine Zofran and Phenergan. Plus a work note. Patient will be given first dose of anti-birth here to make sure that he does not have a adverse reaction.  Final Clinical Impressions(s) / ED Diagnoses   Final diagnoses:  Dizziness  Nausea  Near syncope  Precordial pain    New Prescriptions New Prescriptions   MECLIZINE (ANTIVERT) 25 MG TABLET    Take 1 tablet (25 mg total) by mouth 3 (three) times daily as needed for dizziness.   ONDANSETRON (ZOFRAN ODT) 4 MG DISINTEGRATING TABLET    Take 1 tablet (4 mg total) by mouth every 8 (eight) hours as needed for nausea or vomiting.   PROMETHAZINE (PHENERGAN) 25 MG TABLET    Take 1 tablet (25 mg total) by mouth every 6 (six) hours as needed for nausea or vomiting.     Vanetta MuldersZackowski, Piper Albro, MD 01/14/17 [id number]

## 2017-01-14 NOTE — Telephone Encounter (Signed)
Noted  

## 2017-01-14 NOTE — Telephone Encounter (Signed)
°  Patient Name: Ronald SchwalbeDARRON Burton  DOB: August 08, 1970    Initial Comment Caller states he went to the ER yesterday, shortness of breath, numbness of left arm pain, chest pain. He left the hospital, not wanting to wait. Still having some issues with breathing. Still having some nausea and dizziness.   Nurse Assessment  Nurse: Renaldo FiddlerAdkins, RN, Raynelle FanningJulie Date/Time Lamount Cohen(Eastern Time): 01/14/2017 12:19:54 PM  Confirm and document reason for call. If symptomatic, describe symptoms. ---Caller states he went to the ER yesterday, he had shortness of breath, numbness of left arm pain, nausea, jaw and chest pain. He left the hospital, not wanting to wait. He woke up this morning with chest pain, is nauseated and has numbness in the left arm. He has been having sob "off and on". He still has chest pain at this time.  Does the patient have any new or worsening symptoms? ---Yes  Will a triage be completed? ---Yes  Related visit to physician within the last 2 weeks? ---No  Does the PT have any chronic conditions? (i.e. diabetes, asthma, etc.) ---Yes  List chronic conditions. ---Htn,( stopped meds) High cholesterol, Migraines, Allergies  Is this a behavioral health or substance abuse call? ---No     Guidelines    Guideline Title Affirmed Question Affirmed Notes  Chest Pain [1] Chest pain lasts > 5 minutes AND [2] age > 30 AND [3] at least one cardiac risk factor (i.e., hypertension, diabetes, obesity, smoker or strong family history of heart disease)    Final Disposition User   Call EMS 911 Now Renaldo FiddlerAdkins, RN, Raynelle FanningJulie    Referrals  GO TO FACILITY REFUSED   Disagree/Comply: Disagree  Disagree/Comply Reason: Disagree with instructions

## 2017-01-14 NOTE — Telephone Encounter (Signed)
Called patient due to lwot to inquire about condition and follow up plans. Spoke to his wife.  She says she saw the wbc on mychart and he is going to follow up with pcp.  I explained importance of physician exam to determine diagnosis--especially with chest pain.

## 2017-01-14 NOTE — ED Notes (Signed)
Patient ambulated from room to restroom and back with steady gait. He states he feels a little disorientated to his surroundings, a little dizzy. MD aware, see new orders.

## 2017-01-14 NOTE — ED Triage Notes (Signed)
Per yesterdays labs pt has elevated WBC, no protocol labs placed in triage except istat trop

## 2017-02-16 ENCOUNTER — Other Ambulatory Visit (INDEPENDENT_AMBULATORY_CARE_PROVIDER_SITE_OTHER): Payer: Self-pay | Admitting: Otolaryngology

## 2017-02-16 DIAGNOSIS — J329 Chronic sinusitis, unspecified: Secondary | ICD-10-CM

## 2017-02-18 ENCOUNTER — Ambulatory Visit
Admission: RE | Admit: 2017-02-18 | Discharge: 2017-02-18 | Disposition: A | Discharge status: Home / Self Care | Payer: 59 | Source: Ambulatory Visit | Attending: Otolaryngology | Admitting: Otolaryngology

## 2017-02-18 DIAGNOSIS — J329 Chronic sinusitis, unspecified: Secondary | ICD-10-CM

## 2017-03-03 ENCOUNTER — Emergency Department (HOSPITAL_COMMUNITY): Payer: 59

## 2017-03-03 ENCOUNTER — Encounter (HOSPITAL_COMMUNITY): Payer: Self-pay | Admitting: Emergency Medicine

## 2017-03-03 ENCOUNTER — Emergency Department (HOSPITAL_COMMUNITY)
Admission: EM | Admit: 2017-03-03 | Discharge: 2017-03-03 | Disposition: A | Discharge status: Home / Self Care | Payer: 59 | Attending: Emergency Medicine | Admitting: Emergency Medicine

## 2017-03-03 DIAGNOSIS — Z79899 Other long term (current) drug therapy: Secondary | ICD-10-CM | POA: Diagnosis not present

## 2017-03-03 DIAGNOSIS — I10 Essential (primary) hypertension: Secondary | ICD-10-CM | POA: Diagnosis not present

## 2017-03-03 DIAGNOSIS — Z7982 Long term (current) use of aspirin: Secondary | ICD-10-CM | POA: Diagnosis not present

## 2017-03-03 DIAGNOSIS — R002 Palpitations: Secondary | ICD-10-CM

## 2017-03-03 DIAGNOSIS — F17290 Nicotine dependence, other tobacco product, uncomplicated: Secondary | ICD-10-CM | POA: Diagnosis not present

## 2017-03-03 DIAGNOSIS — R0789 Other chest pain: Secondary | ICD-10-CM

## 2017-03-03 LAB — I-STAT TROPONIN, ED
Troponin i, poc: 0 ng/mL (ref 0.00–0.08)
Troponin i, poc: 0 ng/mL (ref 0.00–0.08)

## 2017-03-03 LAB — CBC WITH DIFFERENTIAL/PLATELET
Basophils Absolute: 0 10*3/uL (ref 0.0–0.1)
Basophils Relative: 0 %
Eosinophils Absolute: 0 10*3/uL (ref 0.0–0.7)
Eosinophils Relative: 0 %
HEMATOCRIT: 44.3 % (ref 39.0–52.0)
Hemoglobin: 15.4 g/dL (ref 13.0–17.0)
LYMPHS ABS: 1.7 10*3/uL (ref 0.7–4.0)
Lymphocytes Relative: 14 %
MCH: 28.6 pg (ref 26.0–34.0)
MCHC: 34.8 g/dL (ref 30.0–36.0)
MCV: 82.2 fL (ref 78.0–100.0)
MONOS PCT: 4 %
Monocytes Absolute: 0.5 10*3/uL (ref 0.1–1.0)
Neutro Abs: 9.9 10*3/uL — ABNORMAL HIGH (ref 1.7–7.7)
Neutrophils Relative %: 82 %
Platelets: 300 10*3/uL (ref 150–400)
RBC: 5.39 MIL/uL (ref 4.22–5.81)
RDW: 13.4 % (ref 11.5–15.5)
WBC: 12.1 10*3/uL — AB (ref 4.0–10.5)

## 2017-03-03 LAB — BASIC METABOLIC PANEL
ANION GAP: 8 (ref 5–15)
BUN: 7 mg/dL (ref 6–20)
CALCIUM: 9.4 mg/dL (ref 8.9–10.3)
CO2: 23 mmol/L (ref 22–32)
Chloride: 104 mmol/L (ref 101–111)
Creatinine, Ser: 0.94 mg/dL (ref 0.61–1.24)
GFR calc Af Amer: >60 mL/min (ref >60)
GFR calc non Af Amer: >60 mL/min (ref >60)
GLUCOSE: 108 mg/dL — AB (ref 65–99)
Potassium: 4.5 mmol/L (ref 3.5–5.1)
SODIUM: 135 mmol/L (ref 135–145)

## 2017-03-03 LAB — D-DIMER, QUANTITATIVE: D-Dimer, Quant: 0.29 ug/mL-FEU (ref 0.00–0.50)

## 2017-03-03 MED ORDER — SODIUM CHLORIDE 0.9 % IV BOLUS (SEPSIS)
1000.0000 mL | Freq: Once | INTRAVENOUS | Status: AC
  Administered 2017-03-03: 1000 mL via INTRAVENOUS

## 2017-03-03 NOTE — ED Triage Notes (Signed)
Pt started having chest pain with palpations while on [facility] way [facility]. Pt was noted to have an abnormal ekg with LVH [facility] so [facility]. Pt was given 324 asa and 2 sl nitro tablets. Pt was also given 3 nitro "sprays" en route with ems. Pt currently is pain free. While getting out of the ems truck with [facility] ems per ems the stretcher legs did not come down as they pull the stretcher out of the truck causing the stretcher to fall along with the patient. Pt denies hitting head but landed on right side, as swelling in right elbow.

## 2017-03-03 NOTE — ED Provider Notes (Signed)
[facility] Provider Note   CSN: [id number] Arrival date & time: 03/03/17  [id number]     History   Chief Complaint Chief Complaint  Patient presents with  . Chest Pain    HPI Ronald Burton is a 46 y.o. male.  HPI  46 year old male with a history of hypertension and hyperlipidemia presents with chest pain and palpitations.  He [place] [facility] [place] when he developed a feeling of his heart beating out of his chest with palpitations and chest pain.  The pain was sharp and also a warm feeling just left of his sternum.  It became progressively worse.  While he was in the clinic he told someone about it and they did not ECG and sent him here.  He states that he was given aspirin by EMS and 2 nitroglycerin and his pain is better.  Before it was about an 8/10 and now is about a 1 or 2 out of 10.  He has had this pain many times before typically about once a month for the last several years.  It always has palpitations with it.  No pleuritic pain.  No leg swelling.  He did just come [place] from [place] about 1 week ago.  He states that the pain seemed more severe today but otherwise pretty similar to the symptoms he has been getting over the last several years.  The pain is similar to when he was here about a month ago although he did not have the dizziness or nausea today. While in ambulance bay the paramedics had a hard time with the stretcher and he fell out onto the ground, injuring his right elbow. Mild pain at this time with mild swelling.  Past Medical History:  Diagnosis Date  . GERD (gastroesophageal reflux disease)   . Hyperlipidemia   . Hypertension   . Irregular heart beat   . OSA (obstructive sleep apnea)   . Seasonal allergies   . Urine incontinence     Patient Active Problem List   Diagnosis Date Noted  . Allergic rhinitis 12/24/2016  . Hyperlipidemia 12/24/2016  . Preventative health care  12/24/2016  . Essential hypertension 02/27/2016  . BPH (benign prostatic hyperplasia) 02/27/2016  . Arthralgia 02/27/2016  . GERD (gastroesophageal reflux disease) 02/27/2016    History reviewed. No pertinent surgical history.     Home Medications    Prior to Admission medications   Medication Sig Start Date End Date Taking? Authorizing Provider  aspirin EC 81 MG tablet Take 81 mg by mouth daily.    Yes [provider]  cetirizine (ZYRTEC) 10 MG tablet Take 10 mg by mouth daily.   Yes [provider]  DEXILANT 60 MG capsule TAKE 1 CAPSULE BY MOUTH  DAILY Patient taking differently: Take 60 mg by mouth once a day 05/30/16  Yes Doreene Nestlark, Katherine K, NP  diclofenac sodium (VOLTAREN) 1 % GEL Apply 2-4 g topically daily as needed (for knee pain).  06/27/16  Yes [provider]  fluticasone (FLONASE) 50 MCG/ACT nasal spray Place 1 spray into both nostrils 2 (two) times daily as needed for allergies or rhinitis.    Yes [provider]  meclizine (ANTIVERT) 25 MG tablet Take 1 tablet (25 mg total) by mouth 3 (three) times daily as needed for dizziness. 01/14/17  Yes Vanetta MuldersZackowski, Dustie Brittle, MD  montelukast (SINGULAIR) 10 MG tablet Take 10 mg by mouth at bedtime.   Yes [provider]  ondansetron (ZOFRAN ODT) 4 MG disintegrating tablet Take 1 tablet (4 mg total) by mouth every 8 (eight) hours as needed for nausea or vomiting. 01/14/17  Yes Vanetta Mulders, MD  predniSONE (STERAPRED UNI-PAK 21 TAB) 10 MG (21) TBPK tablet Use as directed 02/12/17  Yes [provider]  promethazine (PHENERGAN) 25 MG tablet Take 1 tablet (25 mg total) by mouth every 6 (six) hours as needed for nausea or vomiting. 01/14/17  Yes Vanetta Mulders, MD  simvastatin (ZOCOR) 20 MG tablet Take 20 mg by mouth every evening.    Yes [provider]  topiramate (TOPAMAX) 25 MG tablet Take 25 mg by mouth daily as needed (at onset of headaches).    Yes [provider]  UNABLE  TO FIND CPAP: At bedtime   Yes [provider]  amoxicillin-clavulanate (AUGMENTIN) 875-125 MG tablet Take 1 tablet by mouth 2 (two) times daily. 02/12/17   [provider]  lisinopril-hydrochlorothiazide (PRINZIDE,ZESTORETIC) 10-12.5 MG tablet TAKE 1 TABLET BY MOUTH  DAILY Patient not taking: Reported on 01/14/2017 05/30/16   Doreene Nest, NP  terbinafine (LAMISIL) 250 MG tablet Take 1 tablet (250 mg total) by mouth daily. Patient not taking: Reported on 01/14/2017 09/19/16   Lenn Sink, DPM    Family History Family History  Problem Relation Age of Onset  . Arthritis Mother   . Hyperlipidemia Mother   . Hypertension Mother   . Stroke Mother   . Diabetes Mother   . Stomach cancer Mother   . Hyperlipidemia Father   . Heart disease Father   . Hypertension Father     Social History Social History  Substance Use Topics  . Smoking status: Current Some Day Smoker  . Smokeless tobacco: Current User    Types: Chew  . Alcohol use No     Allergies   Paxil [paroxetine]   Review of Systems Review of Systems  Constitutional: Negative for diaphoresis and fever.  Respiratory: Positive for shortness of breath.   Cardiovascular: Positive for chest pain. Negative for leg swelling.  Gastrointestinal: Negative for abdominal pain and vomiting.  All other systems reviewed and are negative.    Physical Exam Updated Vital Signs BP 115/86   Pulse 82   Temp 98.1 F (36.7 C) (Oral)   Resp 16   SpO2 100%   Physical Exam  Constitutional: He is oriented to person, place, and time. He appears well-developed and well-nourished. No distress.  HENT:  Head: Normocephalic and atraumatic.  Right Ear: External ear normal.  Left Ear: External ear normal.  Nose: Nose normal.  Eyes: Right eye exhibits no discharge. Left eye exhibits no discharge.  Neck: Neck supple.  Cardiovascular: Normal rate, regular rhythm and normal heart sounds.   Pulses:      Radial pulses are 2+  on the right side, and 2+ on the left side.  HR~100  Pulmonary/Chest: Effort normal and breath sounds normal. He exhibits no tenderness.  Abdominal: Soft. There is no tenderness.  Musculoskeletal: He exhibits no edema.       Right elbow: He exhibits normal range of motion. No tenderness found.       Right forearm: He exhibits tenderness and swelling. He exhibits no bony tenderness.       Arms: Neurological: He is alert and oriented to person, place, and time.  Skin: Skin is warm and dry. He is not diaphoretic.  Nursing note and vitals reviewed.    ED Treatments / Results  Labs (  all labs ordered are listed, but only abnormal results are displayed) Labs Reviewed  BASIC METABOLIC PANEL - Abnormal; Notable for the following:       Result Value   Glucose, Bld 108 (*)    All other components within normal limits  CBC WITH DIFFERENTIAL/PLATELET - Abnormal; Notable for the following:    WBC 12.1 (*)    Neutro Abs 9.9 (*)    All other components within normal limits  D-DIMER, QUANTITATIVE (NOT AT [facility])  I-STAT TROPONIN, ED  I-STAT TROPONIN, ED    EKG  EKG Interpretation  Date/Time:  Tuesday March 03 2017 13:11:20 EDT Ventricular Rate:  90 PR Interval:    QRS Duration: 74 QT Interval:  340 QTC Calculation: 416 R Axis:   82 Text Interpretation:  Sinus rhythm ST elev, probable normal early repol pattern no significant change since earlier in the day Confirmed by Pricilla Loveless [phone]) on 03/03/2017 1:25:35 PM       Radiology Dg Chest 2 View  Result Date: 03/03/2017 CLINICAL DATA:  Chest pain, dizziness EXAM: CHEST  2 VIEW COMPARISON:  01/14/2017 FINDINGS: Heart and mediastinal contours are within normal limits. No focal opacities or effusions. No acute bony abnormality. IMPRESSION: No active cardiopulmonary disease. Electronically Signed   By: Charlett Nose M.D.   On: 03/03/2017 10:54    Procedures Procedures (including critical care time)  Medications Ordered in  ED Medications  sodium chloride 0.9 % bolus 1,000 mL (0 mLs Intravenous Stopped 03/03/17 1504)     Initial Impression / Assessment and Plan / ED Course  I have reviewed the triage vital signs and the nursing notes.  Pertinent labs & imaging results that were available during my care of the patient were reviewed by me and considered in my medical decision making (see chart for details).     Patient's chest pain is atypical.  It could all be palpitations related although ECG in the (while he is not having palpitations) is unremarkable.  No new changes or dynamic changes on repeat ECG.  Has 2 negative troponins.  Given recent travel a d-dimer sent for otherwise low risk PE, I think this is effectively ruled out.  Will have him follow with cardiology, he may need Holter monitoring.  Otherwise, he has had this chest pain on and off for several years.  Discharge home with return precautions.  Final Clinical Impressions(s) / ED Diagnoses   Final diagnoses:  Palpitations  Atypical chest pain    New Prescriptions Discharge Medication List as of 03/03/2017  2:46 PM       Pricilla Loveless, MD 03/03/17 1656

## 2017-03-03 NOTE — ED Notes (Signed)
ED Provider at bedside. 

## 2017-03-05 ENCOUNTER — Ambulatory Visit (INDEPENDENT_AMBULATORY_CARE_PROVIDER_SITE_OTHER): Payer: 59 | Admitting: Cardiovascular Disease

## 2017-03-05 ENCOUNTER — Encounter: Payer: Self-pay | Admitting: Cardiovascular Disease

## 2017-03-05 VITALS — BP 135/86 | HR 95 | Ht 69.0 in | Wt 185.8 lb

## 2017-03-05 DIAGNOSIS — Z8669 Personal history of other diseases of the nervous system and sense organs: Secondary | ICD-10-CM

## 2017-03-05 DIAGNOSIS — K219 Gastro-esophageal reflux disease without esophagitis: Secondary | ICD-10-CM | POA: Diagnosis not present

## 2017-03-05 DIAGNOSIS — E785 Hyperlipidemia, unspecified: Secondary | ICD-10-CM | POA: Diagnosis not present

## 2017-03-05 DIAGNOSIS — R002 Palpitations: Secondary | ICD-10-CM

## 2017-03-05 DIAGNOSIS — R0789 Other chest pain: Secondary | ICD-10-CM | POA: Diagnosis not present

## 2017-03-05 DIAGNOSIS — I10 Essential (primary) hypertension: Secondary | ICD-10-CM | POA: Diagnosis not present

## 2017-03-05 DIAGNOSIS — G4733 Obstructive sleep apnea (adult) (pediatric): Secondary | ICD-10-CM

## 2017-03-05 MED ORDER — METOPROLOL SUCCINATE ER 50 MG PO TB24: 50.0000 mg | ORAL_TABLET | Freq: Every day | ORAL | 3 refills | Status: DC

## 2017-03-05 NOTE — Progress Notes (Signed)
Cardiology Office Note    Date:  03/12/2017   ID:  Ronald Burton, DOB [birth year], MRN [id number]  PCP:  Pleas Koch, NP  Cardiologist:  Shelva Majestic, MD   Chief Complaint  Patient presents with  . New Patient (Initial Visit)   Cardiology consultation, referred by Dr. Sherwood Gambler for evaluation of palpitations and atypical chest pain  History of Present Illness:  Ronald Burton is a 46 y.o. male who has a history of hypertension, hyperlipidemia, and had recently presented to the emergency room with palpitations, and sharp chest discomfort.  He is referred for cardiology consultation.  Mr. Ronald Burton admits to history of hypertension for at least 4 years.  He also has a history of migraine headaches, panic attacks, experience episodes of chest pain which he describes as chest burning, at times her chest tightness and more often are a sharp knifelike sensation.  He has experienced palpitations off and on for several months and oftentimes wakes up in the middle of night with palpitations.  These palpitations may last 10-15 minutes.  He admits to having significant sinus issues.  He is sleep is poor.  He apparently was told of having sleep apnea in 2016 while living in [place].  He has a CPAP machine but has not been using it.  He admits to snoring and at times wakes up gasping for breath.  He recently presented to the emergency room with a feeling that his heart was beating out of his chest and also with some sharp chest pain.  He was tachycardic with a pulse up to 100.  ECG showed early repolarization.  He is referred for Cardiologic evaluation.  The patient states that while he lived in [place] he remotely had undergone a stress test and was told that this was normal.   Past Medical History:  Diagnosis Date  . GERD (gastroesophageal reflux disease)   . Hyperlipidemia   . Hypertension   . Irregular heart beat   . OSA (obstructive sleep apnea)   . Seasonal allergies   .  Urine incontinence     No past surgical history on file.  Current Medications: Outpatient Medications Prior to Visit  Medication Sig Dispense Refill  . aspirin EC 81 MG tablet Take 81 mg by mouth daily.     . cetirizine (ZYRTEC) 10 MG tablet Take 10 mg by mouth daily.    Marland Kitchen DEXILANT 60 MG capsule TAKE 1 CAPSULE BY MOUTH  DAILY (Patient taking differently: Take 60 mg by mouth once a day) 90 capsule 1  . diclofenac sodium (VOLTAREN) 1 % GEL Apply 2-4 g topically daily as needed (for knee pain).     . fluticasone (FLONASE) 50 MCG/ACT nasal spray Place 1 spray into both nostrils 2 (two) times daily as needed for allergies or rhinitis.     Marland Kitchen lisinopril-hydrochlorothiazide (PRINZIDE,ZESTORETIC) 10-12.5 MG tablet TAKE 1 TABLET BY MOUTH  DAILY 90 tablet 1  . meclizine (ANTIVERT) 25 MG tablet Take 1 tablet (25 mg total) by mouth 3 (three) times daily as needed for dizziness. 30 tablet 0  . montelukast (SINGULAIR) 10 MG tablet Take 10 mg by mouth at bedtime.    . ondansetron (ZOFRAN ODT) 4 MG disintegrating tablet Take 1 tablet (4 mg total) by mouth every 8 (eight) hours as needed for nausea or vomiting. 10 tablet 1  . promethazine (PHENERGAN) 25 MG tablet Take 1 tablet (25 mg total) by mouth every 6 (six) hours as needed for nausea or vomiting. 12 tablet  1  . simvastatin (ZOCOR) 20 MG tablet Take 20 mg by mouth every evening.     . terbinafine (LAMISIL) 250 MG tablet Take 1 tablet (250 mg total) by mouth daily. 90 tablet 0  . topiramate (TOPAMAX) 25 MG tablet Take 25 mg by mouth daily as needed (at onset of headaches).     Marland Kitchen UNABLE TO FIND CPAP: At bedtime    . predniSONE (STERAPRED UNI-PAK 21 TAB) 10 MG (21) TBPK tablet Use as directed  0  . amoxicillin-clavulanate (AUGMENTIN) 875-125 MG tablet Take 1 tablet by mouth 2 (two) times daily.  0   No facility-administered medications prior to visit.      Allergies:   Paxil [paroxetine]   Social History   Social History  . Marital status: Married      Spouse name: N/A  . Number of children: N/A  . Years of education: N/A   Social History Main Topics  . Smoking status: Current Some Day Smoker  . Smokeless tobacco: Current User    Types: Chew  . Alcohol use No  . Drug use: No  . Sexual activity: Not Asked   Other Topics Concern  . None   Social History Narrative   Married.   6 children, 1 grandchild.   Works as a Health and safety inspector   Enjoys spending time with family, video games.     Socially he is married for 3 years.  He has 3 children and 2 grandchildren.  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  He completed 12th grade of education.  He chews tobacco.  He does not drink alcohol.  He does not routinely exercise.  Family History:  The patient's family history includes Arthritis in his mother; Diabetes in his mother; Heart disease in his father; Hyperlipidemia in his father and mother; Hypertension in his father and mother; Stomach cancer in his mother; Stroke in his mother.  His mother 9, father 68.  He has one alive.  Brother at age 3, one brother died age 37.  He has 2 sisters and 3 children.  ROS General: Negative; No fevers, chills, or night sweats;  HEENT: Negative; No changes in vision or hearing, sinus congestion, difficulty swallowing Pulmonary: Negative; No cough, wheezing, shortness of breath, hemoptysis Cardiovascular: see HPI GI: Negative; No nausea, vomiting, diarrhea, or abdominal pain GU: Negative; No dysuria, hematuria, or difficulty voiding Musculoskeletal: Negative; no myalgias, joint pain, or weakness Hematologic/Oncology: Negative; no easy bruising, bleeding Endocrine: Negative; no heat/cold intolerance; no diabetes Neuro: Positive for history of migraines and panic attacks. Skin: Negative; No rashes or skin lesions Psychiatric: Negative; No behavioral problems, depression Sleep: Positive for reported history of OSA, currently not using CPAP.  He admits to snoring,  nocturia, nonrestorative sleep and awakening gasping for breath.   Other comprehensive 14 point system review is negative.   PHYSICAL EXAM:   VS:  BP 135/86 (BP Location: Right Arm, Cuff Size: Normal)   Pulse 95   Ht 5' 9" (1.753 m)   Wt 185 lb 12.8 oz (84.3 kg)   BMI 27.44 kg/m     Repeat blood pressure by me 120/86  Wt Readings from Last 3 Encounters:  03/05/17 185 lb 12.8 oz (84.3 kg)  01/14/17 173 lb (78.5 kg)  01/13/17 179 lb (81.2 kg)    General: Alert, oriented, no distress.  Skin: normal turgor, no rashes, warm and dry HEENT: Normocephalic, atraumatic. Pupils equal round and reactive to light; sclera  anicteric; extraocular muscles intact; Fundi no hemorrhages or exudates.  Disks flat Nose without nasal septal hypertrophy Mouth/Parynx benign; Mallinpatti scale 3 Neck: No JVD, no carotid bruits; normal carotid upstroke Lungs: clear to ausculatation and percussion; no wheezing or rales Chest wall: without tenderness to palpitation Heart: PMI not displaced, RRR, s1 s2 normal, 1/6 systolic murmur, no diastolic murmur, no rubs, gallops, thrills, or heaves Abdomen: soft, nontender; no hepatosplenomehaly, BS+; abdominal aorta nontender and not dilated by palpation. Back: no CVA tenderness Pulses 2+ Musculoskeletal: full range of motion, normal strength, no joint deformities Extremities: no clubbing cyanosis or edema, Homan's sign negative  Neurologic: grossly nonfocal; Cranial nerves grossly wnl Psychologic: Normal mood and affect   Studies/Labs Reviewed:   EKG:  EKG is ordered today.  ECG (independently read by me): Normal sinus rhythm at 92 bpm.  Normal intervals.  No ectopy.  Recent Labs: BMP Latest Ref Rng & Units 03/03/2017 01/14/2017 01/13/2017  Glucose 65 - 99 mg/dL 108(H) 93 130(H)  BUN 6 - 20 mg/dL _0 Creatinine 0.61 - 1.24 mg/dL 0.94 <0.30(L) 1.10  Sodium 135 - 145 mmol/L 135 138 138  Potassium 3.5 - 5.1 mmol/L 4.5 4.1 3.6  Chloride 101 - 111 mmol/L 104 104  106  CO2 22 - 32 mmol/L _1 Calcium 8.9 - 10.3 mg/dL 9.4 9.4 9.4     Hepatic Function Latest Ref Rng & Units 01/14/2017 12/01/2016  Total Protein 6.5 - 8.1 g/dL 7.0 7.0  Albumin 3.5 - 5.0 g/dL 4.1 4.3  AST 15 - 41 U/L 19 16  ALT 17 - 63 U/L 20 24  Alk Phosphatase 38 - 126 U/L 77 73  Total Bilirubin 0.3 - 1.2 mg/dL 1.2 1.1    CBC Latest Ref Rng & Units 03/03/2017 01/14/2017 01/13/2017  WBC 4.0 - 10.5 K/uL 12.1(H) 12.6(H) 15.4(H)  Hemoglobin 13.0 - 17.0 g/dL 15.4 15.7 15.0  Hematocrit 39.0 - 52.0 % 44.3 44.7 43.5  Platelets 150 - 400 K/uL 300 282 291   Lab Results  Component Value Date   MCV 82.2 03/03/2017   MCV 80.5 01/14/2017   MCV 81.0 01/13/2017   Lab Results  Component Value Date   TSH 1.040 03/05/2017   Lab Results  Component Value Date   HGBA1C 5.3 12/01/2016     BNP No results found for: BNP  ProBNP No results found for: PROBNP   Lipid Panel     Component Value Date/Time   CHOL 129 12/01/2016 0831   TRIG 54.0 12/01/2016 0831   HDL 41.20 12/01/2016 0831   CHOLHDL 3 12/01/2016 0831   VLDL 10.8 12/01/2016 0831   LDLCALC 77 12/01/2016 0831     RADIOLOGY: Dg Chest 2 View  Result Date: 03/03/2017 CLINICAL DATA:  Chest pain, dizziness EXAM: CHEST  2 VIEW COMPARISON:  01/14/2017 FINDINGS: Heart and mediastinal contours are within normal limits. No focal opacities or effusions. No acute bony abnormality. IMPRESSION: No active cardiopulmonary disease. Electronically Signed   By: Rolm Baptise M.D.   On: 03/03/2017 10:54   Ct Maxillofacial Wo Contrast  Result Date: 02/19/2017 CLINICAL DATA:  Initial evaluation for chronic sinusitis, worse over past 2 months, with pain and postnasal drainage. Currently on antibiotics and prednisone. EXAM: CT MAXILLOFACIAL WITHOUT CONTRAST TECHNIQUE: Multidetector CT images of the paranasal sinuses were obtained using the standard protocol without intravenous contrast. COMPARISON:  None available. FINDINGS: Paranasal sinuses:  Frontal: Frontal sinuses are well pneumatized. Chronic mucoperiosteal thickening present about the fronto ethmoidal  recesses which remain patent. Ethmoid: Chronic mucoperiosteal thickening present within the anterior and posterior ethmoidal air cells bilaterally, right slightly greater than left. Maxillary: Chronic circumferential mucoperiosteal thickening within the maxillary sinuses bilaterally, greatest inferiorly. Sphenoid: Chronic mucoperiosteal thickening within the sphenoid sinuses bilaterally. Right sphenoethmoidal foramen is opacified. Left sphenoethmoidal foramen is patent. Right ostiomeatal unit: Mucosal thickening but patent. Left ostiomeatal unit: Mucosal thickening but patent. Nasal passages: Patent. S-shaped bowing of the nasal septum which is intact. Secondary asymmetric narrowing of the right nasal cavity. Concha bullosa of the left middle turbinate noted. Other: No air-fluid level to suggest acute sinusitis at this time. Visualized mastoid air cells and middle ear cavities are largely clear. Visualized soft tissues of the face are within normal limits. Globes and orbital soft tissues within normal limits. Visualized intracranial contents unremarkable. IMPRESSION: Chronic pan sinusitis as above. No air-fluid levels to suggest acute sinusitis at this time. Electronically Signed   By: Jeannine Boga M.D.   On: 02/19/2017 04:23     Additional studies/ records that were reviewed today include:  I reviewed the emergency room records    ASSESSMENT:    1. Palpitations   2. Atypical chest pain   3. OSA (obstructive sleep apnea)   4. Essential hypertension   5. Hyperlipidemia, unspecified hyperlipidemia type   6. Gastroesophageal reflux disease without esophagitis   7. History of migraine headaches     PLAN:  Mr. Werts is a 46 year old African-American male who has a four-year history of hypertension, history of met migraine headaches, panic attacks, and remotely had experienced  episodes of chest pain for which she had undergone a stress test while living in [place], [place] which he states was normal.  He recently has experienced increasing palpitations as well as episodes of sharp chest pain as well as chest burning and some vague chest tightness.  With his palpitation history, I am initiating therapy with metoprolol, succinate and he will start 25 mg for one week and if tolerates he will increase this to 50 mg daily.  I am scheduling him for 2-D echo Doppler study to evaluate both systolic and diastolic function and valvular architecture.  He has a history of hypertension, apparently has not taken lisinopril HCT and over 6-8 weeks.   I reviewed his history which is suggestive of sleep apnea.  He apparently has a CPAP machine but has not been using it.  I stressed the importance of reinitiating therapy.  I reviewed his laboratory from's emergency room evaluation and will check a magnesium, TSH and free T4 level since these were not done at his emergency room evaluation.  He has a history of hyperlipidemia and has been taking simvastatin 20 mg daily.  Most recent LDL was 77.  He has GERD on Dexilant.  He takes topiramate for his migraine headaches and denies any recent exacerbation.  I will see him in 6 weeks for cardiology reevaluation.   Medication Adjustments/Labs and Tests Ordered: Current medicines are reviewed at length with the patient today.  Concerns regarding medicines are outlined above.  Medication changes, Labs and Tests ordered today are listed in the Patient Instructions below. Patient Instructions  Medication Instructions:  START metoprolol succinate (Toprol XL)  --take 1/2 tablet (25 mg) daily x 1 week --if you continue to have palpitations, increase to 1 tablet (45m) daily  Labwork: TODAY (TSH, Mag, Free T4)  Testing/Procedures: Your physician has requested that you have an echocardiogram. Echocardiography is a painless test that uses sound waves to  create images of your heart. It provides your doctor with information about the size and shape of your heart and how well your heart's chambers and valves are working. This procedure takes approximately one hour. There are no restrictions for this procedure. This will be done at our [facility] location:  [place]: Your physician recommends that you schedule a follow-up appointment in: 6 weeks with Dr. Claiborne Billings.    Any Other Special Instructions Will Be Listed Below (If Applicable).     If you need a refill on your cardiac medications before your next appointment, please call your pharmacy.      Signed, Shelva Majestic, MD  03/12/2017 10:23 PM    [facility] [address] [place], [place], [place]  [address] Phone: [phone]

## 2017-03-05 NOTE — Patient Instructions (Signed)
Medication Instructions:  START metoprolol succinate (Toprol XL)  --take 1/2 tablet (25 mg) daily x 1 week --if you continue to have palpitations, increase to 1 tablet (50mg ) daily  Labwork: TODAY (TSH, Mag, Free T4)  Testing/Procedures: Your physician has requested that you have an echocardiogram. Echocardiography is a painless test that uses sound waves to create images of your heart. It provides your doctor with information about the size and shape of your heart and how well your heart's chambers and valves are working. This procedure takes approximately one hour. There are no restrictions for this procedure. [facility]  [facility]  Follow-Up: Your physician recommends that you schedule a follow-up appointment in: 6 weeks with Dr. Tresa EndoKelly.    Any Other Special Instructions Will Be Listed Below (If Applicable).     If you need a refill on your cardiac medications before your next appointment, please call your pharmacy.

## 2017-03-06 LAB — T4, FREE: FREE T4: 1.23 ng/dL (ref 0.82–1.77)

## 2017-03-06 LAB — TSH: TSH: 1.04 u[IU]/mL (ref 0.450–4.500)

## 2017-03-06 LAB — MAGNESIUM: MAGNESIUM: 2.4 mg/dL — AB (ref 1.6–2.3)

## 2017-03-12 ENCOUNTER — Ambulatory Visit (HOSPITAL_COMMUNITY): Payer: 59 | Attending: Cardiovascular Disease

## 2017-03-12 ENCOUNTER — Other Ambulatory Visit: Payer: Self-pay

## 2017-03-12 DIAGNOSIS — E785 Hyperlipidemia, unspecified: Secondary | ICD-10-CM | POA: Insufficient documentation

## 2017-03-12 DIAGNOSIS — Z720 Tobacco use: Secondary | ICD-10-CM | POA: Diagnosis not present

## 2017-03-12 DIAGNOSIS — R002 Palpitations: Secondary | ICD-10-CM | POA: Diagnosis not present

## 2017-03-12 DIAGNOSIS — I10 Essential (primary) hypertension: Secondary | ICD-10-CM | POA: Insufficient documentation

## 2017-03-12 DIAGNOSIS — I071 Rheumatic tricuspid insufficiency: Secondary | ICD-10-CM | POA: Insufficient documentation

## 2017-03-12 LAB — ECHOCARDIOGRAM COMPLETE
Ao-asc: 30 cm
CHL CUP DOP CALC LVOT VTI: 24.4 cm
CHL CUP MV DEC (S): 141
EERAT: 7.68
EWDT: 141 ms
FS: 30 % (ref 28–44)
IVS/LV PW RATIO, ED: 1.1
LA ID, A-P, ES: 37 mm
LA diam end sys: 37 mm
LA vol: 51.6 mL
LADIAMINDEX: 1.85 cm/m2
LAVOLA4C: 50.9 mL
LAVOLIN: 25.8 mL/m2
LDCA: 3.8 cm2
LV E/e'average: 7.68
LV PW d: 11.4 mm — AB (ref 0.6–1.1)
LV TDI E'MEDIAL: 9.14
LVEEMED: 7.68
LVELAT: 11.4 cm/s
LVOT diameter: 22 mm
LVOT peak grad rest: 6 mmHg
LVOTPV: 127 cm/s
LVOTSV: 93 mL
MV Peak grad: 3 mmHg
MV pk A vel: 58.3 m/s
MV pk E vel: 87.5 m/s
RV LATERAL S' VELOCITY: 13.5 cm/s
TAPSE: 25.6 mm
TDI e' lateral: 11.4

## 2017-04-27 ENCOUNTER — Ambulatory Visit (INDEPENDENT_AMBULATORY_CARE_PROVIDER_SITE_OTHER): Payer: 59 | Admitting: Cardiovascular Disease

## 2017-04-27 ENCOUNTER — Encounter: Payer: Self-pay | Admitting: Cardiovascular Disease

## 2017-04-27 VITALS — BP 100/72 | HR 78 | Ht 71.0 in | Wt 186.0 lb

## 2017-04-27 DIAGNOSIS — K219 Gastro-esophageal reflux disease without esophagitis: Secondary | ICD-10-CM | POA: Diagnosis not present

## 2017-04-27 DIAGNOSIS — R0789 Other chest pain: Secondary | ICD-10-CM

## 2017-04-27 DIAGNOSIS — E785 Hyperlipidemia, unspecified: Secondary | ICD-10-CM | POA: Diagnosis not present

## 2017-04-27 DIAGNOSIS — I10 Essential (primary) hypertension: Secondary | ICD-10-CM

## 2017-04-27 DIAGNOSIS — Z8669 Personal history of other diseases of the nervous system and sense organs: Secondary | ICD-10-CM | POA: Diagnosis not present

## 2017-04-27 DIAGNOSIS — R002 Palpitations: Secondary | ICD-10-CM | POA: Diagnosis not present

## 2017-04-27 DIAGNOSIS — G4733 Obstructive sleep apnea (adult) (pediatric): Secondary | ICD-10-CM

## 2017-04-27 MED ORDER — METOPROLOL SUCCINATE ER 50 MG PO TB24: 50.0000 mg | ORAL_TABLET | Freq: Every day | ORAL | 3 refills | Status: DC

## 2017-04-27 NOTE — Patient Instructions (Signed)
Medication Instructions:  Your physician recommends that you continue on your current medications as directed. Please refer to the Current Medication list given to you today.  Follow-Up: Your physician wants you to follow-up in: 12 months with Dr. Kelly.  You will receive a reminder letter in the mail two months in advance. If you don't receive a letter, please call our office to schedule the follow-up appointment.   If you need a refill on your cardiac medications before your next appointment, please call your pharmacy.   

## 2017-04-27 NOTE — Progress Notes (Signed)
Cardiology Office Note    Date:  04/28/2017   ID:  Ronald Burton, DOB [birth year], MRN [id number]  PCP:  Pleas Koch, NP  Cardiologist:  Shelva Majestic, MD   No chief complaint on file.  Cardiology consultation, referred by Dr. Sherwood Gambler for evaluation of palpitations and atypical chest pain  History of Present Illness:  Ronald Burton is a 46 y.o. male who has a history of hypertension, hyperlipidemia, and had recently presented to the emergency room with palpitations, and sharp chest discomfort.  I saw him for initial cardiology evaluation on 03/12/2017.  He presents for six-week follow-up evaluation. [name] admits to history of hypertension for at least 4 years.  He also has a history of migraine headaches, panic attacks, experience episodes of chest pain which he describes as chest burning, at times her chest tightness and more often are a sharp knifelike sensation.  He has experienced palpitations off and on for several months and oftentimes wakes up in the middle of night with palpitations.  These palpitations may last 10-15 minutes.  He admits to having significant sinus issues.  He is sleep is poor.  He apparently was told of having sleep apnea in 2016 while living in [place].  He has a CPAP machine but has not been using it.  He admits to snoring and at times wakes up gasping for breath.  While living in [place] he had remotely undergone a stress test which was normal.  He  presented to the emergency room with a feeling that his heart was beating out of his chest and also with some sharp chest pain.  He was tachycardic with a pulse up to 100.  ECG showed early repolarization.  I saw for initial cardiology evaluation 03/05/2017.  I reviewed his prior laboratory and recommended magnesium, TSH and free T4 checked which were normal.  An echo Doppler study on 03/12/2017 showed an ejection fraction of 60-65% with mild LVH.  He had normal diastolic function.  He had normal  valvular architecture.  He has felt significant improved since initiating metoprolol and is now taking this 25 mg twice a day.  He is unaware of any recurrent palpitations.  He presents for reevaluation.  Past Medical History:  Diagnosis Date  . GERD (gastroesophageal reflux disease)   . Hyperlipidemia   . Hypertension   . Irregular heart beat   . OSA (obstructive sleep apnea)   . Seasonal allergies   . Urine incontinence     No past surgical history on file.  Current Medications: Outpatient Medications Prior to Visit  Medication Sig Dispense Refill  . aspirin EC 81 MG tablet Take 81 mg by mouth daily.     . cetirizine (ZYRTEC) 10 MG tablet Take 10 mg by mouth daily.    Marland Kitchen DEXILANT 60 MG capsule TAKE 1 CAPSULE BY MOUTH  DAILY (Patient taking differently: Take 60 mg by mouth once a day) 90 capsule 1  . diclofenac sodium (VOLTAREN) 1 % GEL Apply 2-4 g topically daily as needed (for knee pain).     . fluticasone (FLONASE) 50 MCG/ACT nasal spray Place 1 spray into both nostrils 2 (two) times daily as needed for allergies or rhinitis.     Marland Kitchen meclizine (ANTIVERT) 25 MG tablet Take 1 tablet (25 mg total) by mouth 3 (three) times daily as needed for dizziness. 30 tablet 0  . montelukast (SINGULAIR) 10 MG tablet Take 10 mg by mouth at bedtime.    . ondansetron (ZOFRAN ODT)  4 MG disintegrating tablet Take 1 tablet (4 mg total) by mouth every 8 (eight) hours as needed for nausea or vomiting. 10 tablet 1  . promethazine (PHENERGAN) 25 MG tablet Take 1 tablet (25 mg total) by mouth every 6 (six) hours as needed for nausea or vomiting. 12 tablet 1  . simvastatin (ZOCOR) 20 MG tablet Take 20 mg by mouth every evening.     . terbinafine (LAMISIL) 250 MG tablet Take 1 tablet (250 mg total) by mouth daily. 90 tablet 0  . topiramate (TOPAMAX) 25 MG tablet Take 25 mg by mouth daily as needed (at onset of headaches).     Marland Kitchen UNABLE TO FIND CPAP: At bedtime    . lisinopril-hydrochlorothiazide  (PRINZIDE,ZESTORETIC) 10-12.5 MG tablet TAKE 1 TABLET BY MOUTH  DAILY 90 tablet 1  . metoprolol succinate (TOPROL XL) 50 MG 24 hr tablet Take 1 tablet (50 mg total) by mouth daily. Take with or immediately following a meal. 90 tablet 3   No facility-administered medications prior to visit.      Allergies:   Paxil [paroxetine]   Social History   Socioeconomic History  . Marital status: Married    Spouse name: None  . Number of children: None  . Years of education: None  . Highest education level: None  Social Needs  . Financial resource strain: None  . Food insecurity - worry: None  . Food insecurity - inability: None  . Transportation needs - medical: None  . Transportation needs - non-medical: None  Occupational History  . None  Tobacco Use  . Smoking status: Current Some Day Smoker  . Smokeless tobacco: Current User    Types: Chew  Substance and Sexual Activity  . Alcohol use: No  . Drug use: No  . Sexual activity: None  Other Topics Concern  . None  Social History Narrative   Married.   6 children, 1 grandchild.   Works as a Health and safety inspector   Enjoys spending time with family, video games.     Socially he is married for 3 years.  He has 3 children and 2 grandchildren.  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  He completed 12th grade of education.  He chews tobacco.  He does not drink alcohol.  He does not routinely exercise.  Family History:  The patient's family history includes Arthritis in his mother; Diabetes in his mother; Heart disease in his father; Hyperlipidemia in his father and mother; Hypertension in his father and mother; Stomach cancer in his mother; Stroke in his mother.  His mother 51, father 42.  He has one alive.  Brother at age 87, one brother died age 68.  He has 2 sisters and 3 children.  ROS General: Negative; No fevers, chills, or night sweats;  HEENT: Negative; No changes in vision or hearing, sinus congestion,  difficulty swallowing Pulmonary: Negative; No cough, wheezing, shortness of breath, hemoptysis Cardiovascular: see HPI GI: Negative; No nausea, vomiting, diarrhea, or abdominal pain GU: Negative; No dysuria, hematuria, or difficulty voiding Musculoskeletal: Negative; no myalgias, joint pain, or weakness Hematologic/Oncology: Negative; no easy bruising, bleeding Endocrine: Negative; no heat/cold intolerance; no diabetes Neuro: Positive for history of migraines and panic attacks. Skin: Negative; No rashes or skin lesions Psychiatric: Negative; No behavioral problems, depression Sleep: Positive for reported history of OSA, currently not using CPAP.  He admits to snoring, nocturia, nonrestorative sleep and awakening gasping for breath.   Other comprehensive 14 point  system review is negative.   PHYSICAL EXAM:   VS:  BP 100/72   Pulse 78   Ht '5\' 11"'$  (1.803 m)   Wt 186 lb (84.4 kg)   BMI 25.94 kg/m     Repeat blood pressure is 118/70 supine as well as standing.  Wt Readings from Last 3 Encounters:  04/27/17 186 lb (84.4 kg)  03/05/17 185 lb 12.8 oz (84.3 kg)  01/14/17 173 lb (78.5 kg)    General: Alert, oriented, no distress.  Skin: normal turgor, no rashes, warm and dry HEENT: Normocephalic, atraumatic. Pupils equal round and reactive to light; sclera anicteric; extraocular muscles intact;  Nose without nasal septal hypertrophy Mouth/Parynx benign; Mallinpatti scale 3 Neck: No JVD, no carotid bruits; normal carotid upstroke Lungs: clear to ausculatation and percussion; no wheezing or rales Chest wall: without tenderness to palpitation Heart: PMI not displaced, RRR, s1 s2 normal, 1/6 systolic murmur, no diastolic murmur, no rubs, gallops, thrills, or heaves Abdomen: soft, nontender; no hepatosplenomehaly, BS+; abdominal aorta nontender and not dilated by palpation. Back: no CVA tenderness Pulses 2+ Musculoskeletal: full range of motion, normal strength, no joint  deformities Extremities: no clubbing cyanosis or edema, Homan's sign negative  Neurologic: grossly nonfocal; Cranial nerves grossly wnl Psychologic: Normal mood and affect   Studies/Labs Reviewed:   EKG:  EKG is ordered today.  ECG (independently read by me): Normal sinus rhythm at 78 bpm.  No ectopy.  Normal intervals.  October 25, /2018 ECG (independently read by me): Normal sinus rhythm at 92 bpm.  Normal intervals.  No ectopy.  Recent Labs: BMP Latest Ref Rng & Units 03/03/2017 01/14/2017 01/13/2017  Glucose 65 - 99 mg/dL 108(H) 93 130(H)  BUN 6 - 20 mg/dL '7 8 9  '$ Creatinine 0.61 - 1.24 mg/dL 0.94 <0.30(L) 1.10  Sodium 135 - 145 mmol/L 135 138 138  Potassium 3.5 - 5.1 mmol/L 4.5 4.1 3.6  Chloride 101 - 111 mmol/L 104 104 106  CO2 22 - 32 mmol/L '23 25 25  '$ Calcium 8.9 - 10.3 mg/dL 9.4 9.4 9.4     Hepatic Function Latest Ref Rng & Units 01/14/2017 12/01/2016  Total Protein 6.5 - 8.1 g/dL 7.0 7.0  Albumin 3.5 - 5.0 g/dL 4.1 4.3  AST 15 - 41 U/L 19 16  ALT 17 - 63 U/L 20 24  Alk Phosphatase 38 - 126 U/L 77 73  Total Bilirubin 0.3 - 1.2 mg/dL 1.2 1.1    CBC Latest Ref Rng & Units 03/03/2017 01/14/2017 01/13/2017  WBC 4.0 - 10.5 K/uL 12.1(H) 12.6(H) 15.4(H)  Hemoglobin 13.0 - 17.0 g/dL 15.4 15.7 15.0  Hematocrit 39.0 - 52.0 % 44.3 44.7 43.5  Platelets 150 - 400 K/uL 300 282 291   Lab Results  Component Value Date   MCV 82.2 03/03/2017   MCV 80.5 01/14/2017   MCV 81.0 01/13/2017   Lab Results  Component Value Date   TSH 1.040 03/05/2017   Lab Results  Component Value Date   HGBA1C 5.3 12/01/2016     BNP No results found for: BNP  ProBNP No results found for: PROBNP   Lipid Panel     Component Value Date/Time   CHOL 129 12/01/2016 0831   TRIG 54.0 12/01/2016 0831   HDL 41.20 12/01/2016 0831   CHOLHDL 3 12/01/2016 0831   VLDL 10.8 12/01/2016 0831   LDLCALC 77 12/01/2016 0831     RADIOLOGY: No results found.   Additional studies/ records that were reviewed  today include:  I  reviewed the emergency room records    ASSESSMENT:    1. Palpitations   2. Atypical chest pain   3. OSA (obstructive sleep apnea)   4. Essential hypertension   5. Hyperlipidemia, unspecified hyperlipidemia type   6. Gastroesophageal reflux disease without esophagitis   7. History of migraine headaches     PLAN:  Mr. Shannahan is a 46 year old African-American male who has a four-year history of hypertension, a history of migraine headaches, panic attacks, and remotely had experienced episodes of chest pain for which he had undergone a stress test while living in [place], [place] which he states was normal.  He he had experienced increasing palpitations as well as episodes of sharp chest pain as well as chest burning and some vague chest tightness.  I reviewed his echo Doppler study with him in detail. This shows normal systolic and diastolic function.  He had been on lisinopril HCT but had not taken this recently and when I saw him initially I initiated metoprolol.  He has felt significantly improved on 25 mg twice a day.  Specifically, he has not had any recurrent palpitations.  He is no longer taking his lisinopril HCT and his blood pressure today is excellent with the addition of beta blocker therapy.  I again stressed the importance of utilization of his CPAP.  He has a CPAP machine at home and I discussed the potential adverse consequences of his untreated sleep apnea particularly with reference to hypertension as well as nocturnal palpitations as well as other related issues.  I discussed the importance of avoiding pseudoephedrine preparations.  He was unaware that pseudoephedrine can illicate palpitations.  His free T4 was 1.23, TSH 1.04.  Magnesium was minimally increased at 2.4.  He continues to take simvastatin 20 mg for hyperlipidemia and most recent LDL was 77.  He is on topiramate 25 mg as needed for migraines.  His GERD is controlled on Dexilant.  As long as he is  stable, I will see him in one year for reevaluation or sooner if issues develop.   Medication Adjustments/Labs and Tests Ordered: Current medicines are reviewed at length with the patient today.  Concerns regarding medicines are outlined above.  Medication changes, Labs and Tests ordered today are listed in the Patient Instructions below. Patient Instructions  Medication Instructions:  Your physician recommends that you continue on your current medications as directed. Please refer to the Current Medication list given to you today.  Follow-Up: Your physician wants you to follow-up in: 12 months with Dr. Claiborne Billings. You will receive a reminder letter in the mail two months in advance. If you don't receive a letter, please call our office to schedule the follow-up appointment.   If you need a refill on your cardiac medications before your next appointment, please call your pharmacy.      Signed, Shelva Majestic, MD  04/28/2017 7:57 AM    [facility] [address] [place], [place], [place]  [address] Phone: [phone]

## 2017-04-28 ENCOUNTER — Encounter: Payer: Self-pay | Admitting: Cardiovascular Disease

## 2017-04-29 NOTE — Addendum Note (Signed)
Addended by: Kandice RobinsonsYOUNG, Demetrious Rainford T on: 04/29/2017 09:50 AM   Modules accepted: Orders

## 2017-05-21 DIAGNOSIS — G43009 Migraine without aura, not intractable, without status migrainosus: Secondary | ICD-10-CM | POA: Diagnosis not present

## 2017-05-21 DIAGNOSIS — T426X6A Underdosing of other antiepileptic and sedative-hypnotic drugs, initial encounter: Secondary | ICD-10-CM | POA: Diagnosis not present

## 2017-05-21 DIAGNOSIS — Z9114 Patient's other noncompliance with medication regimen: Secondary | ICD-10-CM | POA: Diagnosis not present

## 2017-05-27 ENCOUNTER — Ambulatory Visit: Payer: 59 | Admitting: Primary Care

## 2017-05-27 ENCOUNTER — Telehealth: Payer: Self-pay | Admitting: *Deleted

## 2017-05-27 NOTE — Telephone Encounter (Signed)
No fee. Thanks.

## 2017-05-27 NOTE — Telephone Encounter (Signed)
Copied from CRM [id number]. Topic: Quick Communication - Appointment Cancellation >> May 27, 2017 10:46 AM Gerrianne ScalePayne, Angela L wrote: Patient called to cancel appointment scheduled for 05-27-17. Patient has rescheduled their appointment.    Route to department's PEC pool.  Should patient be charged a late cancellation fee?

## 2017-06-01 ENCOUNTER — Ambulatory Visit: Payer: 59 | Admitting: Primary Care

## 2017-06-03 DIAGNOSIS — E119 Type 2 diabetes mellitus without complications: Secondary | ICD-10-CM | POA: Diagnosis not present

## 2017-06-03 DIAGNOSIS — M545 Low back pain: Secondary | ICD-10-CM | POA: Diagnosis not present

## 2017-06-03 DIAGNOSIS — G4700 Insomnia, unspecified: Secondary | ICD-10-CM | POA: Diagnosis not present

## 2017-06-03 DIAGNOSIS — G43701 Chronic migraine without aura, not intractable, with status migrainosus: Secondary | ICD-10-CM | POA: Diagnosis not present

## 2017-06-10 DIAGNOSIS — J019 Acute sinusitis, unspecified: Secondary | ICD-10-CM | POA: Diagnosis not present

## 2017-06-11 ENCOUNTER — Encounter: Payer: Self-pay | Admitting: Primary Care

## 2017-06-11 ENCOUNTER — Ambulatory Visit: Payer: 59 | Admitting: Primary Care

## 2017-06-11 VITALS — BP 146/94 | HR 79 | Temp 98.0°F | Wt 187.5 lb

## 2017-06-11 DIAGNOSIS — M25511 Pain in right shoulder: Secondary | ICD-10-CM

## 2017-06-11 DIAGNOSIS — J329 Chronic sinusitis, unspecified: Secondary | ICD-10-CM | POA: Insufficient documentation

## 2017-06-11 NOTE — Progress Notes (Signed)
Subjective:    Patient ID: Ronald Burton, male    DOB: [birth year], 47 y.o.   MRN: [id number]  HPI  Mr. Doss is a 47 year old male who presents today with multiple complaints. He follows through the [facility] for most of his care.   1) Sinusitis: Presented to Urgent Care yesterday and was diagnosed with sinusitis. He's had a 2 week history of symptoms of nasal congestion, sinus pressure, headache. He was treated with oral antibiotics and prednisone and is feeling better already.   He has a history of persistent allergies and recurrent sinusitis. He's undergone CT of the maxillofacial area and was told he may need a mild surgery. He is following with ENT and plans on scheduling another visit.   2) Shoulder Pain: Located to the right shoulder that has been present for the last 4 weeks after falling out of the EMS gurney 6 weeks ago.  His pain is located to the right shoulder, bilateral upper back, and now left shoulder. He's noticed a decrease in ROM. He denies numbness/tingling, radiation of pain to right upper extremity. He's not taken anything for pain given his high blood pressure. 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 This has been chronic for years.   Review of Systems  HENT: Positive for congestion and sinus pressure. Negative for sore throat.   Respiratory: Negative for cough.   Musculoskeletal:       Acute right shoulder pain       Past Medical History:  Diagnosis Date  . GERD (gastroesophageal reflux disease)   . Hyperlipidemia   . Hypertension   . Irregular heart beat   . OSA (obstructive sleep apnea)   . Seasonal allergies   . Urine incontinence      Social History   Socioeconomic History  . Marital status: Married    Spouse name: Not on file  . Number of children: Not on file  . Years of education: Not on file  . Highest education level: Not on file  Social Needs  . Financial resource strain: Not on file  . Food insecurity -  worry: Not on file  . Food insecurity - inability: Not on file  . Transportation needs - medical: Not on file  . Transportation needs - non-medical: Not on file  Occupational History  . Not on file  Tobacco Use  . Smoking status: Current Some Day Smoker  . Smokeless tobacco: Current User    Types: Chew  Substance and Sexual Activity  . Alcohol use: No  . Drug use: No  . Sexual activity: Not on file  Other Topics Concern  . Not on file  Social History Narrative   Married.   6 children, 1 grandchild.   Works as a Art therapist   Enjoys spending time with family, video games.     No past surgical history on file.  Family History  Problem Relation Age of Onset  . Arthritis Mother   . Hyperlipidemia Mother   . Hypertension Mother   . Stroke Mother   . Diabetes Mother   . Stomach cancer Mother   . Hyperlipidemia Father   . Heart disease Father   . Hypertension Father     Allergies  Allergen Reactions  . Paxil [Paroxetine] Other (See Comments)    Induced panic    Current Outpatient Medications on File Prior to Visit  Medication Sig Dispense Refill  . amoxicillin-clavulanate (AUGMENTIN) 875-125 MG tablet Take 1 tablet  by mouth 2 (two) times daily.     Marland Kitchen aspirin EC 81 MG tablet Take 81 mg by mouth daily.     . busPIRone (BUSPAR) 5 MG tablet Take 10 mg by mouth 2 (two) times daily.    . cetirizine (ZYRTEC) 10 MG tablet Take 10 mg by mouth daily.    Marland Kitchen DEXILANT 60 MG capsule TAKE 1 CAPSULE BY MOUTH  DAILY 90 capsule 1  . diclofenac sodium (VOLTAREN) 1 % GEL Apply 2-4 g topically daily as needed (for knee pain).     . fluticasone (FLONASE) 50 MCG/ACT nasal spray Place 1 spray into both nostrils 2 (two) times daily as needed for allergies or rhinitis.     Marland Kitchen meclizine (ANTIVERT) 25 MG tablet Take 1 tablet (25 mg total) by mouth 3 (three) times daily as needed for dizziness. 30 tablet 0  . metoprolol succinate (TOPROL XL) 50 MG 24 hr tablet Take 1 tablet (50 mg total) by mouth  daily. Take with or immediately following a meal. 90 tablet 3  . montelukast (SINGULAIR) 10 MG tablet Take 10 mg by mouth at bedtime.    . ondansetron (ZOFRAN ODT) 4 MG disintegrating tablet Take 1 tablet (4 mg total) by mouth every 8 (eight) hours as needed for nausea or vomiting. 10 tablet 1  . promethazine (PHENERGAN) 25 MG tablet Take 1 tablet (25 mg total) by mouth every 6 (six) hours as needed for nausea or vomiting. 12 tablet 1  . simvastatin (ZOCOR) 20 MG tablet Take 20 mg by mouth every evening.     . terbinafine (LAMISIL) 250 MG tablet Take 1 tablet (250 mg total) by mouth daily. 90 tablet 0  . topiramate (TOPAMAX) 25 MG tablet Take 25 mg by mouth daily as needed (at onset of headaches).     Marland Kitchen UNABLE TO FIND CPAP: At bedtime    . predniSONE (DELTASONE) 20 MG tablet      No current facility-administered medications on file prior to visit.     BP (!) 146/94   Pulse 79   Temp 98 F (36.7 C) (Oral)   Wt 187 lb 8 oz (85 kg)   SpO2 98%   BMI 26.15 kg/m    Objective:   Physical Exam  Constitutional: He appears well-nourished.  HENT:  Right Ear: Tympanic membrane and ear canal normal.  Left Ear: Tympanic membrane and ear canal normal.  Nose: No mucosal edema. Right sinus exhibits maxillary sinus tenderness. Right sinus exhibits no frontal sinus tenderness. Left sinus exhibits maxillary sinus tenderness. Left sinus exhibits no frontal sinus tenderness.  Mouth/Throat: Oropharynx is clear and moist.  Eyes: Conjunctivae are normal.  Neck: Neck supple.  Cardiovascular: Normal rate and regular rhythm.  Pulmonary/Chest: Effort normal and breath sounds normal. He has no wheezes. He has no rales.  Musculoskeletal:       Right shoulder: He exhibits decreased range of motion and pain. He exhibits no tenderness, no deformity and normal strength.  Decrease in ROM with most planes of ROM including lateral and posterior abduction. Strength 5/5 to bilateral upper extremities.  Skin: Skin is  warm and dry.          Assessment & Plan:  Acute Shoulder Pain:  Since falling out of EMS gurney 6 weeks ago. Exam today with moderate decrease in ROM with pain. Do not appreciate fracture or dislocation on exam. Xray of shoulder pending, he will return when he has additional time. Discussed short term use of NSAID's with BP  monitoring. Hold off of NSAID's since on prednisone for sinusitis. Stretching and ROM exercises provided. Consider PT vs Ortho based off of plain films and reaction to stretching/ROM exercises.

## 2017-06-11 NOTE — Assessment & Plan Note (Signed)
Recent infection diagnosed and treated yesterday.  Patient will see ENT soon for further evaluation.

## 2017-06-11 NOTE — Patient Instructions (Addendum)
Come back anytime to complete your shoulder xray.  Call Dr. Suszanne Conners for evaluation of the recurrent sinus infections.  Try these stretching exercises at home.  It was a pleasure to see you today!   Shoulder Range of Motion Exercises Shoulder range of motion (ROM) exercises are designed to keep the shoulder moving freely. They are often recommended for people who have shoulder pain. Phase 1 exercises When you are able, do this exercise 5-6 days per week, or as told by your health care provider. Work toward doing 2 sets of 10 swings. Pendulum Exercise How To Do This Exercise Lying Down 1. Lie face-down on a bed with your abdomen close to the side of the bed. 2. Let your arm hang over the side of the bed. 3. Relax your shoulder, arm, and hand. 4. Slowly and gently swing your arm forward and back. Do not use your neck muscles to swing your arm. They should be relaxed. If you are struggling to swing your arm, have someone gently swing it for you. When you do this exercise for the first time, swing your arm at a 15 degree angle for 15 seconds, or swing your arm 10 times. As pain lessens over time, increase the angle of the swing to 30-45 degrees. 5. Repeat steps 1-4 with the other arm.  How To Do This Exercise While Standing 1. Stand next to a sturdy chair or table and hold on to it with your hand. 1. Bend forward at the waist. 2. Bend your knees slightly. 3. Relax your other arm and let it hang limp. 4. Relax the shoulder blade of the arm that is hanging and let it drop. 5. While keeping your shoulder relaxed, use body motion to swing your arm in small circles. The first time you do this exercise, swing your arm for about 30 seconds or 10 times. When you do it next time, swing your arm for a little longer. 6. Stand up tall and relax. 7. Repeat steps 1-7, this time changing the direction of the circles. 2. Repeat steps 1-8 with the other arm.  Phase 2 exercises Do these exercises 3-4 times  per day on 5-6 days per week or as told by your health care provider. Work toward holding the stretch for 20 seconds. Stretching Exercise 1 1. Lift your arm straight out in front of you. 2. Bend your arm 90 degrees at the elbow (right angle) so your forearm goes across your body and looks like the letter "L." 3. Use your other arm to gently pull the elbow forward and across your body. 4. Repeat steps 1-3 with the other arm. Stretching Exercise 2 You will need a towel or rope for this exercise. 1. Bend one arm behind your back with the palm facing outward. 2. Hold a towel with your other hand. 3. Reach the arm that holds the towel above your head, and bend that arm at the elbow. Your wrist should be behind your neck. 4. Use your free hand to grab the free end of the towel. 5. With the higher hand, gently pull the towel up behind you. 6. With the lower hand, pull the towel down behind you. 7. Repeat steps 1-6 with the other arm.  Phase 3 exercises Do each of these exercises at four different times of day (sessions) every day or as told by your health care provider. To begin with, repeat each exercise 5 times (repetitions). Work toward doing 3 sets of 12 repetitions or as told  by your health care provider. Strengthening Exercise 1 You will need a light weight for this activity. As you grow stronger, you may use a heavier weight. 1. Standing with a weight in your hand, lift your arm straight out to the side until it is at the same height as your shoulder. 2. Bend your arm at 90 degrees so that your fingers are pointing to the ceiling. 3. Slowly raise your hand until your arm is straight up in the air. 4. Repeat steps 1-3 with the other arm.  Strengthening Exercise 2 You will need a light weight for this activity. As you grow stronger, you may use a heavier weight. 1. Standing with a weight in your hand, gradually move your straight arm in an arc, starting at your side, then out in front of  you, then straight up over your head. 2. Gradually move your other arm in an arc, starting at your side, then out in front of you, then straight up over your head. 3. Repeat steps 1-2 with the other arm.  Strengthening Exercise 3 You will need an elastic band for this activity. As you grow stronger, gradually increase the size of the bands or increase the number of bands that you use at one time. 1. While standing, hold an elastic band in one hand and raise that arm up in the air. 2. With your other hand, pull down the band until that hand is by your side. 3. Repeat steps 1-2 with the other arm.  This information is not intended to replace advice given to you by your health care provider. Make sure you discuss any questions you have with your health care provider. Document Released: 01/25/2003 Document Revised: 12/23/2015 Document Reviewed: 04/24/2014 Elsevier Interactive Patient Education  [employer]2018 [employer]

## 2017-06-15 ENCOUNTER — Ambulatory Visit
Admission: RE | Admit: 2017-06-15 | Discharge: 2017-06-15 | Disposition: A | Discharge status: Home / Self Care | Payer: 59 | Source: Ambulatory Visit | Attending: Primary Care | Admitting: Primary Care

## 2017-06-15 DIAGNOSIS — M25511 Pain in right shoulder: Secondary | ICD-10-CM

## 2017-06-16 ENCOUNTER — Ambulatory Visit: Admission: RE | Admit: 2017-06-16 | Payer: 59 | Source: Ambulatory Visit

## 2017-06-20 ENCOUNTER — Ambulatory Visit (HOSPITAL_COMMUNITY)
Admission: EM | Admit: 2017-06-20 | Discharge: 2017-06-20 | Disposition: A | Discharge status: Home / Self Care | Payer: 59 | Attending: Physician Assistant | Admitting: Physician Assistant

## 2017-06-20 ENCOUNTER — Encounter (HOSPITAL_COMMUNITY): Payer: Self-pay | Admitting: Emergency Medicine

## 2017-06-20 DIAGNOSIS — J0121 Acute recurrent ethmoidal sinusitis: Secondary | ICD-10-CM

## 2017-06-20 MED ORDER — DOXYCYCLINE HYCLATE 100 MG PO CAPS: 100.0000 mg | ORAL_CAPSULE | Freq: Two times a day (BID) | ORAL | 0 refills | Status: DC

## 2017-06-20 NOTE — ED Provider Notes (Signed)
[facility]    CSN: [id number] Arrival date & time: 06/20/17  1730     History   Chief Complaint Chief Complaint  Patient presents with  . Facial Pain  . Nasal Congestion    HPI Ronald Burton is a 47 y.o. male.    Presents with reoccurring sinusitis. He notes a chronic history of this. He was seen in the urgent care in [place] and treated with Augmentin x 10 days and prednisone x 1 week. He completed his pills yesterday and reports that his pain and "facial pressure" are returning. He states they never really went away. He does have an ENT who as recommended surgery, but he has not followed up. He has no fever or chills. No cough or congestion.       Past Medical History:  Diagnosis Date  . GERD (gastroesophageal reflux disease)   . Hyperlipidemia   . Hypertension   . Irregular heart beat   . OSA (obstructive sleep apnea)   . Seasonal allergies   . Urine incontinence     Patient Active Problem List   Diagnosis Date Noted  . Recurrent sinusitis 06/11/2017  . Allergic rhinitis 12/24/2016  . Hyperlipidemia 12/24/2016  . Preventative health care 12/24/2016  . Essential hypertension 02/27/2016  . BPH (benign prostatic hyperplasia) 02/27/2016  . Arthralgia 02/27/2016  . GERD (gastroesophageal reflux disease) 02/27/2016    History reviewed. No pertinent surgical history.     Home Medications    Prior to Admission medications   Medication Sig Start Date End Date Taking? Authorizing Provider  amoxicillin-clavulanate (AUGMENTIN) 875-125 MG tablet Take 1 tablet by mouth 2 (two) times daily.  06/10/17  Yes [provider]  aspirin EC 81 MG tablet Take 81 mg by mouth daily.    Yes [provider]  busPIRone (BUSPAR) 5 MG tablet Take 10 mg by mouth 2 (two) times daily.   Yes [provider]  cetirizine (ZYRTEC) 10 MG tablet Take 10 mg by mouth daily.   Yes [provider]  DEXILANT 60 MG capsule TAKE 1 CAPSULE BY MOUTH   DAILY 05/30/16  Yes Doreene Nestlark, Katherine K, NP  diclofenac sodium (VOLTAREN) 1 % GEL Apply 2-4 g topically daily as needed (for knee pain).  06/27/16  Yes [provider]  fluticasone (FLONASE) 50 MCG/ACT nasal spray Place 1 spray into both nostrils 2 (two) times daily as needed for allergies or rhinitis.    Yes [provider]  meclizine (ANTIVERT) 25 MG tablet Take 1 tablet (25 mg total) by mouth 3 (three) times daily as needed for dizziness. 01/14/17  Yes Vanetta MuldersZackowski, Scott, MD  metoprolol succinate (TOPROL XL) 50 MG 24 hr tablet Take 1 tablet (50 mg total) by mouth daily. Take with or immediately following a meal. 04/27/17  Yes Lennette BihariKelly, Thomas A, MD  montelukast (SINGULAIR) 10 MG tablet Take 10 mg by mouth at bedtime.   Yes [provider]  predniSONE (DELTASONE) 20 MG tablet  06/10/17  Yes [provider]  promethazine (PHENERGAN) 25 MG tablet Take 1 tablet (25 mg total) by mouth every 6 (six) hours as needed for nausea or vomiting. 01/14/17  Yes Vanetta MuldersZackowski, Scott, MD  simvastatin (ZOCOR) 20 MG tablet Take 20 mg by mouth every evening.    Yes [provider]  topiramate (TOPAMAX) 25 MG tablet Take 25 mg by mouth daily as needed (at onset of headaches).    Yes [provider]  ondansetron (ZOFRAN ODT) 4 MG disintegrating tablet Take  1 tablet (4 mg total) by mouth every 8 (eight) hours as needed for nausea or vomiting. 01/14/17   Vanetta Mulders, MD  terbinafine (LAMISIL) 250 MG tablet Take 1 tablet (250 mg total) by mouth daily. 09/19/16   Lenn Sink, DPM  UNABLE TO FIND CPAP: At bedtime    [provider]    Family History Family History  Problem Relation Age of Onset  . Arthritis Mother   . Hyperlipidemia Mother   . Hypertension Mother   . Stroke Mother   . Diabetes Mother   . Stomach cancer Mother   . Hyperlipidemia Father   . Heart disease Father   . Hypertension Father     Social History Social History   Tobacco Use  .  Smoking status: Current Some Day Smoker  . Smokeless tobacco: Current User    Types: Chew  Substance Use Topics  . Alcohol use: No  . Drug use: No     Allergies   Paxil [paroxetine]   Review of Systems Review of Systems  Constitutional: Negative for chills and fever.  HENT: Positive for congestion, sinus pressure and sinus pain.   Respiratory: Negative for cough and shortness of breath.   Skin: Negative.   Allergic/Immunologic: Positive for environmental allergies.     Physical Exam Triage Vital Signs ED Triage Vitals  Enc Vitals Group     BP 06/20/17 1827 (!) 135/94     Pulse Rate 06/20/17 1827 77     Resp 06/20/17 1827 16     Temp 06/20/17 1827 98.3 F (36.8 C)     Temp Source 06/20/17 1827 Oral     SpO2 06/20/17 1827 96 %     Weight 06/20/17 1824 186 lb (84.4 kg)     Height 06/20/17 1824 5\' 9"  (1.753 m)     Head Circumference --      Peak Flow --      Pain Score 06/20/17 1824 10     Pain Loc --      Pain Edu? --      Excl. in GC? --    No data found.  Updated Vital Signs BP (!) 135/94   Pulse 77   Temp 98.3 F (36.8 C) (Oral)   Resp 16   Ht 5\' 9"  (1.753 m)   Wt 186 lb (84.4 kg)   SpO2 96%   BMI 27.47 kg/m   Visual Acuity Right Eye Distance:   Left Eye Distance:   Bilateral Distance:    Right Eye Near:   Left Eye Near:    Bilateral Near:     Physical Exam  Constitutional: He is oriented to person, place, and time. He appears well-developed and well-nourished. No distress.  HENT:  Right Ear: External ear normal.  Left Ear: External ear normal.  Nose: Nose normal.  Mouth/Throat: Oropharynx is clear and moist.  Neck: Normal range of motion.  Pulmonary/Chest: Effort normal and breath sounds normal.  Lymphadenopathy:    He has no cervical adenopathy.  Neurological: He is alert and oriented to person, place, and time.  Skin: Skin is warm and dry. He is not diaphoretic.  Nursing note and vitals reviewed.    UC Treatments / Results   Labs (all labs ordered are listed, but only abnormal results are displayed) Labs Reviewed - No data to display  EKG  EKG Interpretation None       Radiology No results found.  Procedures Procedures (including critical care time)  Medications Ordered in  UC Medications - No data to display   Initial Impression / Assessment and Plan / UC Course  I have reviewed the triage vital signs and the nursing notes.  Pertinent labs & imaging results that were available during my care of the patient were reviewed by me and considered in my medical decision making (see chart for details).     Most likely a chronic sinus issue perhaps related to allergies. His exam is normal. Persistent symptoms provoked a change in therapy and will try Doxycycline.  I do not recommend further prednisone at this time, he may try some Aleve with close monitoring of his BP. I do recommend nettie pot and nasal rinses. Highly recommend he f/u with ENT Final Clinical Impressions(s) / UC Diagnoses   Final diagnoses:  None    ED Discharge Orders    None       Controlled Substance Prescriptions [place] Controlled Substance Registry consulted? Not Applicable   Sharin Mons 06/20/17 1610

## 2017-06-20 NOTE — ED Triage Notes (Signed)
PT reports sinus symptoms for 2 weeks. PT was seen at an UC 1 week ago and given amoxicillin and prednisone. These meds did not clear it up. PT reports chronic sinus problems.

## 2017-06-20 NOTE — Discharge Instructions (Signed)
You were already treated with Augmentin per your chart which is potent, we will change antibiotic therapy to Doxycycline and try this. Continue use of nettie pot and rinses. FU with your ENT if this continues. Aleve is ok to use 2 twice a day with careful monitoring of your BP

## 2017-07-14 DIAGNOSIS — J322 Chronic ethmoidal sinusitis: Secondary | ICD-10-CM | POA: Diagnosis not present

## 2017-07-14 DIAGNOSIS — J320 Chronic maxillary sinusitis: Secondary | ICD-10-CM | POA: Diagnosis not present

## 2017-07-14 DIAGNOSIS — J321 Chronic frontal sinusitis: Secondary | ICD-10-CM | POA: Diagnosis not present

## 2017-07-30 ENCOUNTER — Other Ambulatory Visit: Payer: Self-pay | Admitting: Otolaryngology

## 2017-07-31 ENCOUNTER — Encounter (HOSPITAL_BASED_OUTPATIENT_CLINIC_OR_DEPARTMENT_OTHER): Payer: Self-pay | Admitting: *Deleted

## 2017-07-31 ENCOUNTER — Other Ambulatory Visit: Payer: Self-pay

## 2017-08-04 ENCOUNTER — Ambulatory Visit (HOSPITAL_BASED_OUTPATIENT_CLINIC_OR_DEPARTMENT_OTHER): Payer: 59 | Admitting: Anesthesiology

## 2017-08-04 ENCOUNTER — Encounter (HOSPITAL_BASED_OUTPATIENT_CLINIC_OR_DEPARTMENT_OTHER): Payer: Self-pay | Admitting: *Deleted

## 2017-08-04 ENCOUNTER — Ambulatory Visit (HOSPITAL_BASED_OUTPATIENT_CLINIC_OR_DEPARTMENT_OTHER)
Admission: RE | Admit: 2017-08-04 | Discharge: 2017-08-04 | Disposition: A | Discharge status: Home / Self Care | Payer: 59 | Source: Ambulatory Visit | Attending: Otolaryngology | Admitting: Otolaryngology

## 2017-08-04 ENCOUNTER — Encounter (HOSPITAL_BASED_OUTPATIENT_CLINIC_OR_DEPARTMENT_OTHER)
Admission: RE | Disposition: A | Discharge status: Home / Self Care | Payer: Self-pay | Source: Ambulatory Visit | Attending: Otolaryngology

## 2017-08-04 DIAGNOSIS — I10 Essential (primary) hypertension: Secondary | ICD-10-CM | POA: Diagnosis not present

## 2017-08-04 DIAGNOSIS — J3489 Other specified disorders of nose and nasal sinuses: Secondary | ICD-10-CM | POA: Diagnosis not present

## 2017-08-04 DIAGNOSIS — K219 Gastro-esophageal reflux disease without esophagitis: Secondary | ICD-10-CM | POA: Insufficient documentation

## 2017-08-04 DIAGNOSIS — G4730 Sleep apnea, unspecified: Secondary | ICD-10-CM | POA: Diagnosis not present

## 2017-08-04 DIAGNOSIS — J322 Chronic ethmoidal sinusitis: Secondary | ICD-10-CM | POA: Diagnosis not present

## 2017-08-04 DIAGNOSIS — J321 Chronic frontal sinusitis: Secondary | ICD-10-CM | POA: Diagnosis not present

## 2017-08-04 DIAGNOSIS — J343 Hypertrophy of nasal turbinates: Secondary | ICD-10-CM

## 2017-08-04 DIAGNOSIS — J324 Chronic pansinusitis: Secondary | ICD-10-CM | POA: Diagnosis not present

## 2017-08-04 DIAGNOSIS — J338 Other polyp of sinus: Secondary | ICD-10-CM | POA: Diagnosis not present

## 2017-08-04 DIAGNOSIS — F419 Anxiety disorder, unspecified: Secondary | ICD-10-CM | POA: Insufficient documentation

## 2017-08-04 DIAGNOSIS — F329 Major depressive disorder, single episode, unspecified: Secondary | ICD-10-CM | POA: Insufficient documentation

## 2017-08-04 DIAGNOSIS — Z79899 Other long term (current) drug therapy: Secondary | ICD-10-CM | POA: Diagnosis not present

## 2017-08-04 DIAGNOSIS — J323 Chronic sphenoidal sinusitis: Secondary | ICD-10-CM | POA: Diagnosis not present

## 2017-08-04 DIAGNOSIS — J342 Deviated nasal septum: Secondary | ICD-10-CM | POA: Diagnosis not present

## 2017-08-04 DIAGNOSIS — J329 Chronic sinusitis, unspecified: Secondary | ICD-10-CM | POA: Diagnosis not present

## 2017-08-04 DIAGNOSIS — J320 Chronic maxillary sinusitis: Secondary | ICD-10-CM | POA: Diagnosis not present

## 2017-08-04 HISTORY — PX: FRONTAL SINUS EXPLORATION: SHX6591

## 2017-08-04 HISTORY — PX: NASAL SEPTOPLASTY W/ TURBINOPLASTY: SHX2070

## 2017-08-04 HISTORY — PX: SINUS ENDO WITH FUSION: SHX5329

## 2017-08-04 HISTORY — DX: Deviated nasal septum: J34.2

## 2017-08-04 HISTORY — DX: Anxiety disorder, unspecified: F41.9

## 2017-08-04 HISTORY — PX: ETHMOIDECTOMY: SHX5197

## 2017-08-04 HISTORY — PX: EXCISION CHONCHA BULLOSA: SHX6435

## 2017-08-04 HISTORY — DX: Unspecified osteoarthritis, unspecified site: M19.90

## 2017-08-04 HISTORY — PX: SPHENOIDECTOMY: SHX2421

## 2017-08-04 HISTORY — DX: Major depressive disorder, single episode, unspecified: F32.9

## 2017-08-04 HISTORY — DX: Headache: R51

## 2017-08-04 HISTORY — DX: Depression, unspecified: F32.A

## 2017-08-04 HISTORY — DX: Headache, unspecified: R51.9

## 2017-08-04 HISTORY — PX: MAXILLARY ANTROSTOMY: SHX2003

## 2017-08-04 SURGERY — SEPTOPLASTY, NOSE, WITH NASAL TURBINATE REDUCTION
Anesthesia: General | Site: Nose

## 2017-08-04 MED ORDER — FENTANYL CITRATE (PF) 100 MCG/2ML IJ SOLN
25.0000 ug | INTRAMUSCULAR | Status: DC | PRN
  Administered 2017-08-04: 50 ug via INTRAVENOUS

## 2017-08-04 MED ORDER — SCOPOLAMINE 1 MG/3DAYS TD PT72
1.0000 | MEDICATED_PATCH | Freq: Once | TRANSDERMAL | Status: DC | PRN
  Administered 2017-08-04: 1.5 mg via TRANSDERMAL

## 2017-08-04 MED ORDER — OXYMETAZOLINE HCL 0.05 % NA SOLN
NASAL | Status: AC
  Filled 2017-08-04: qty 15

## 2017-08-04 MED ORDER — CEFAZOLIN SODIUM-DEXTROSE 2-3 GM-%(50ML) IV SOLR
INTRAVENOUS | Status: DC | PRN
  Administered 2017-08-04: 2 g via INTRAVENOUS

## 2017-08-04 MED ORDER — ACETAMINOPHEN 500 MG PO TABS
ORAL_TABLET | ORAL | Status: AC
  Filled 2017-08-04: qty 2

## 2017-08-04 MED ORDER — SCOPOLAMINE 1 MG/3DAYS TD PT72
MEDICATED_PATCH | TRANSDERMAL | Status: AC
  Filled 2017-08-04: qty 1

## 2017-08-04 MED ORDER — EPHEDRINE 5 MG/ML INJ
INTRAVENOUS | Status: AC
  Filled 2017-08-04: qty 10

## 2017-08-04 MED ORDER — BUPIVACAINE-EPINEPHRINE (PF) 0.25% -1:200000 IJ SOLN
INTRAMUSCULAR | Status: AC
  Filled 2017-08-04: qty 30

## 2017-08-04 MED ORDER — LIDOCAINE HCL (CARDIAC) 20 MG/ML IV SOLN
INTRAVENOUS | Status: AC
  Filled 2017-08-04: qty 5

## 2017-08-04 MED ORDER — EPHEDRINE SULFATE-NACL 50-0.9 MG/10ML-% IV SOSY
PREFILLED_SYRINGE | INTRAVENOUS | Status: DC | PRN
  Administered 2017-08-04: 10 mg via INTRAVENOUS

## 2017-08-04 MED ORDER — ONDANSETRON HCL 4 MG/2ML IJ SOLN
INTRAMUSCULAR | Status: DC | PRN
  Administered 2017-08-04: 4 mg via INTRAVENOUS

## 2017-08-04 MED ORDER — MIDAZOLAM HCL 2 MG/2ML IJ SOLN
INTRAMUSCULAR | Status: AC
  Filled 2017-08-04: qty 2

## 2017-08-04 MED ORDER — ROCURONIUM BROMIDE 100 MG/10ML IV SOLN
INTRAVENOUS | Status: DC | PRN
  Administered 2017-08-04: 50 mg via INTRAVENOUS

## 2017-08-04 MED ORDER — FENTANYL CITRATE (PF) 100 MCG/2ML IJ SOLN
INTRAMUSCULAR | Status: AC
  Filled 2017-08-04: qty 2

## 2017-08-04 MED ORDER — ROCURONIUM BROMIDE 10 MG/ML (PF) SYRINGE
PREFILLED_SYRINGE | INTRAVENOUS | Status: AC
  Filled 2017-08-04: qty 5

## 2017-08-04 MED ORDER — MIDAZOLAM HCL 2 MG/2ML IJ SOLN
1.0000 mg | INTRAMUSCULAR | Status: DC | PRN
  Administered 2017-08-04: 2 mg via INTRAVENOUS

## 2017-08-04 MED ORDER — SUGAMMADEX SODIUM 200 MG/2ML IV SOLN
INTRAVENOUS | Status: AC
  Filled 2017-08-04: qty 2

## 2017-08-04 MED ORDER — LIDOCAINE-EPINEPHRINE 1 %-1:100000 IJ SOLN
INTRAMUSCULAR | Status: DC | PRN
  Administered 2017-08-04: 3.5 mL

## 2017-08-04 MED ORDER — ACETAMINOPHEN 500 MG PO TABS
1000.0000 mg | ORAL_TABLET | Freq: Once | ORAL | Status: AC
  Administered 2017-08-04: 1000 mg via ORAL

## 2017-08-04 MED ORDER — DEXAMETHASONE SODIUM PHOSPHATE 4 MG/ML IJ SOLN
INTRAMUSCULAR | Status: DC | PRN
  Administered 2017-08-04: 10 mg via INTRAVENOUS

## 2017-08-04 MED ORDER — ARTIFICIAL TEARS OPHTHALMIC OINT
TOPICAL_OINTMENT | OPHTHALMIC | Status: AC
  Filled 2017-08-04: qty 3.5

## 2017-08-04 MED ORDER — METOPROLOL TARTRATE 5 MG/5ML IV SOLN
INTRAVENOUS | Status: DC | PRN
  Administered 2017-08-04 (×2): 5 mg via INTRAVENOUS

## 2017-08-04 MED ORDER — FENTANYL CITRATE (PF) 100 MCG/2ML IJ SOLN
50.0000 ug | INTRAMUSCULAR | Status: AC | PRN
  Administered 2017-08-04 (×4): 50 ug via INTRAVENOUS
  Administered 2017-08-04: 100 ug via INTRAVENOUS

## 2017-08-04 MED ORDER — LIDOCAINE-EPINEPHRINE 1 %-1:100000 IJ SOLN
INTRAMUSCULAR | Status: AC
  Filled 2017-08-04: qty 2

## 2017-08-04 MED ORDER — PROMETHAZINE HCL 25 MG/ML IJ SOLN: 6.2500 mg | INTRAMUSCULAR | Status: DC | PRN

## 2017-08-04 MED ORDER — SUGAMMADEX SODIUM 200 MG/2ML IV SOLN
INTRAVENOUS | Status: DC | PRN
  Administered 2017-08-04: 200 mg via INTRAVENOUS

## 2017-08-04 MED ORDER — ONDANSETRON HCL 4 MG/2ML IJ SOLN
INTRAMUSCULAR | Status: AC
  Filled 2017-08-04: qty 2

## 2017-08-04 MED ORDER — CEFAZOLIN SODIUM-DEXTROSE 2-4 GM/100ML-% IV SOLN
INTRAVENOUS | Status: AC
  Filled 2017-08-04: qty 100

## 2017-08-04 MED ORDER — METOPROLOL TARTRATE 5 MG/5ML IV SOLN
INTRAVENOUS | Status: AC
  Filled 2017-08-04: qty 5

## 2017-08-04 MED ORDER — LIDOCAINE HCL (CARDIAC) 10 MG/ML IV SOLN
INTRAVENOUS | Status: DC | PRN
  Administered 2017-08-04: 80 mg via INTRAVENOUS

## 2017-08-04 MED ORDER — PROPOFOL 10 MG/ML IV BOLUS
INTRAVENOUS | Status: DC | PRN
  Administered 2017-08-04: 20 mg via INTRAVENOUS
  Administered 2017-08-04: 30 mg via INTRAVENOUS
  Administered 2017-08-04: 150 mg via INTRAVENOUS

## 2017-08-04 MED ORDER — MUPIROCIN 2 % EX OINT
TOPICAL_OINTMENT | CUTANEOUS | Status: AC
  Filled 2017-08-04: qty 22

## 2017-08-04 MED ORDER — PROPOFOL 500 MG/50ML IV EMUL
INTRAVENOUS | Status: AC
  Filled 2017-08-04: qty 50

## 2017-08-04 MED ORDER — DEXAMETHASONE SODIUM PHOSPHATE 10 MG/ML IJ SOLN
INTRAMUSCULAR | Status: AC
  Filled 2017-08-04: qty 1

## 2017-08-04 MED ORDER — OXYCODONE-ACETAMINOPHEN 5-325 MG PO TABS: 1.0000 | ORAL_TABLET | ORAL | 0 refills | Status: DC | PRN

## 2017-08-04 MED ORDER — OXYMETAZOLINE HCL 0.05 % NA SOLN
NASAL | Status: DC | PRN
  Administered 2017-08-04: 1 via TOPICAL

## 2017-08-04 MED ORDER — MUPIROCIN 2 % EX OINT
TOPICAL_OINTMENT | CUTANEOUS | Status: DC | PRN
  Administered 2017-08-04: 1 via NASAL

## 2017-08-04 MED ORDER — LACTATED RINGERS IV SOLN
INTRAVENOUS | Status: DC
  Administered 2017-08-04 (×3): via INTRAVENOUS

## 2017-08-04 MED ORDER — BACITRACIN ZINC 500 UNIT/GM EX OINT
TOPICAL_OINTMENT | CUTANEOUS | Status: AC
  Filled 2017-08-04: qty 0.9

## 2017-08-04 MED ORDER — AMOXICILLIN 875 MG PO TABS: 875.0000 mg | ORAL_TABLET | Freq: Two times a day (BID) | ORAL | 0 refills | Status: AC

## 2017-08-04 SURGICAL SUPPLY — 65 items
ATTRACTOMAT 16X20 MAGNETIC DRP (DRAPES) IMPLANT
BLADE RAD40 ROTATE 4M 4 5PK (BLADE) IMPLANT
BLADE RAD40 ROTATE 4M 4MM 5PK (BLADE)
BLADE RAD60 ROTATE M4 4 5PK (BLADE) IMPLANT
BLADE RAD60 ROTATE M4 4MM 5PK (BLADE)
BLADE ROTATE RAD 12 4 M4 (BLADE) IMPLANT
BLADE ROTATE RAD 12 4MM M4 (BLADE)
BLADE ROTATE RAD 40 4 M4 (BLADE) IMPLANT
BLADE ROTATE RAD 40 4MM M4 (BLADE)
BLADE ROTATE TRICUT 4MX13CM M4 (BLADE) ×1
BLADE ROTATE TRICUT 4X13 M4 (BLADE) ×4 IMPLANT
BLADE TRICUT ROTATE M4 4 5PK (BLADE) IMPLANT
BLADE TRICUT ROTATE M4 4MM 5PK (BLADE)
BUR HS RAD FRONTAL 3 (BURR) IMPLANT
BUR HS RAD FRONTAL 3MM (BURR)
CANISTER SUC SOCK COL 7IN (MISCELLANEOUS) ×10 IMPLANT
CANISTER SUCT 1200ML W/VALVE (MISCELLANEOUS) ×5 IMPLANT
COAGULATOR SUCT 6 FR SWTCH (ELECTROSURGICAL)
COAGULATOR SUCT 8FR VV (MISCELLANEOUS) ×5 IMPLANT
COAGULATOR SUCT SWTCH 10FR 6 (ELECTROSURGICAL) IMPLANT
DECANTER SPIKE VIAL GLASS SM (MISCELLANEOUS) IMPLANT
DRSG NASAL KENNEDY LMNT 8CM (GAUZE/BANDAGES/DRESSINGS) IMPLANT
DRSG NASOPORE 8CM (GAUZE/BANDAGES/DRESSINGS) ×5 IMPLANT
DRSG TELFA 3X8 NADH (GAUZE/BANDAGES/DRESSINGS) IMPLANT
ELECT REM PT RETURN 9FT ADLT (ELECTROSURGICAL) ×5
ELECTRODE REM PT RTRN 9FT ADLT (ELECTROSURGICAL) ×3 IMPLANT
GLOVE BIO SURGEON STRL SZ 6.5 (GLOVE) ×8 IMPLANT
GLOVE BIO SURGEON STRL SZ7.5 (GLOVE) ×5 IMPLANT
GLOVE BIO SURGEONS STRL SZ 6.5 (GLOVE) ×2
GLOVE BIOGEL PI IND STRL 7.0 (GLOVE) ×9 IMPLANT
GLOVE BIOGEL PI INDICATOR 7.0 (GLOVE) ×6
GLOVE ECLIPSE 7.0 STRL STRAW (GLOVE) ×5 IMPLANT
GOWN STRL REUS W/ TWL LRG LVL3 (GOWN DISPOSABLE) ×12 IMPLANT
GOWN STRL REUS W/TWL LRG LVL3 (GOWN DISPOSABLE) ×8
HEMOSTAT SURGICEL 2X14 (HEMOSTASIS) IMPLANT
IV NS 1000ML (IV SOLUTION)
IV NS 1000ML BAXH (IV SOLUTION) IMPLANT
IV NS 500ML (IV SOLUTION) ×2
IV NS 500ML BAXH (IV SOLUTION) ×3 IMPLANT
NEEDLE HYPO 25X1 1.5 SAFETY (NEEDLE) ×5 IMPLANT
NEEDLE PRECISIONGLIDE 27X1.5 (NEEDLE) IMPLANT
NEEDLE SPNL 25GX3.5 QUINCKE BL (NEEDLE) IMPLANT
NS IRRIG 1000ML POUR BTL (IV SOLUTION) ×5 IMPLANT
PACK BASIN DAY SURGERY FS (CUSTOM PROCEDURE TRAY) ×5 IMPLANT
PACK ENT DAY SURGERY (CUSTOM PROCEDURE TRAY) ×5 IMPLANT
PACKING NASAL EPIS 4X2.4 XEROG (MISCELLANEOUS) IMPLANT
SLEEVE SCD COMPRESS KNEE MED (MISCELLANEOUS) ×5 IMPLANT
SOLUTION BUTLER CLEAR DIP (MISCELLANEOUS) ×5 IMPLANT
SPLINT NASAL AIRWAY SILICONE (MISCELLANEOUS) ×5 IMPLANT
SPONGE GAUZE 2X2 8PLY STER LF (GAUZE/BANDAGES/DRESSINGS) ×1
SPONGE GAUZE 2X2 8PLY STRL LF (GAUZE/BANDAGES/DRESSINGS) ×4 IMPLANT
SPONGE NEURO XRAY DETECT 1X3 (DISPOSABLE) ×5 IMPLANT
SUT CHROMIC 4 0 P 3 18 (SUTURE) ×5 IMPLANT
SUT PLAIN 4 0 ~~LOC~~ 1 (SUTURE) ×5 IMPLANT
SUT PROLENE 3 0 PS 2 (SUTURE) ×5 IMPLANT
SUT VIC AB 4-0 P-3 18XBRD (SUTURE) IMPLANT
SUT VIC AB 4-0 P3 18 (SUTURE)
TOWEL OR 17X24 6PK STRL BLUE (TOWEL DISPOSABLE) ×5 IMPLANT
TRACKER ENT INSTRUMENT (MISCELLANEOUS) ×5 IMPLANT
TRACKER ENT PATIENT (MISCELLANEOUS) ×5 IMPLANT
TUBE CONNECTING 20'X1/4 (TUBING) ×1
TUBE CONNECTING 20X1/4 (TUBING) ×4 IMPLANT
TUBE SALEM SUMP 16 FR W/ARV (TUBING) IMPLANT
TUBING STRAIGHTSHOT EPS 5PK (TUBING) ×5 IMPLANT
YANKAUER SUCT BULB TIP NO VENT (SUCTIONS) ×5 IMPLANT

## 2017-08-04 NOTE — Anesthesia Preprocedure Evaluation (Addendum)
Anesthesia Evaluation  Patient identified by MRN, date of birth, ID band Patient awake  General Assessment Comment:DEVIATED SEPTUM, TURBINATE HYPERTROPHY, LEFT CHONCHA BULLOSA, MAXILLARY SINUSITIS, FRONTAL SINUSITIS, ETHMOID AND SPHENOID SINUSITIS  Reviewed: Allergy & Precautions, NPO status , Patient's Chart, lab work & pertinent test results  Airway Mallampati: II  TM Distance: >3 FB Neck ROM: Full    Dental  (+) Teeth Intact, Dental Advisory Given   Pulmonary sleep apnea ,    Pulmonary exam normal breath sounds clear to auscultation       Cardiovascular hypertension, Pt. on home beta blockers Normal cardiovascular exam Rhythm:Regular Rate:Normal     Neuro/Psych  Headaches, PSYCHIATRIC DISORDERS Anxiety Depression    GI/Hepatic Neg liver ROS, GERD  Medicated,  Endo/Other  negative endocrine ROS  Renal/GU negative Renal ROS Bladder dysfunction      Musculoskeletal  (+) Arthritis ,   Abdominal   Peds  Hematology negative hematology ROS (+)   Anesthesia Other Findings Day of surgery medications reviewed with the patient.  Reproductive/Obstetrics                            Anesthesia Physical Anesthesia Plan  ASA: II  Anesthesia Plan: General   Post-op Pain Management:    Induction: Intravenous  PONV Risk Score and Plan: 3 and Midazolam, Dexamethasone and Ondansetron  Airway Management Planned: Oral ETT  Additional Equipment:   Intra-op Plan:   Post-operative Plan: Extubation in OR  Informed Consent: I have reviewed the patients History and Physical, chart, labs and discussed the procedure including the risks, benefits and alternatives for the proposed anesthesia with the patient or authorized representative who has indicated his/her understanding and acceptance.   Dental advisory given  Plan Discussed with: CRNA  Anesthesia Plan Comments: (IV metoprolol with induction.)        Anesthesia Quick Evaluation

## 2017-08-04 NOTE — H&P (Signed)
Cc: Bilateral chronic rhinosinusitis  HPI: The patient is a 47 year old male who presents today for evaluation of his chronic rhinosinusitis.  The patient was previously seen in 02/2017.  At that time, he was noted to have bilateral chronic rhinosinusitis and bilateral sinonasal polyps.  He was also noted to have severe nasal septal deviation and bilateral inferior turbinate hypertrophy.  The polypoid mucosa was noted to be obstructing the middle meatus bilaterally.  The patient was treated with Augmentin and systemic steroid.  He subsequently underwent a paranasal sinus CT scan.  The CT showed chronic pansinusitis bilaterally, with severe nasal septal deviation, left concha bullosa, and bilateral inferior turbinate hypertrophy.  The patient returns today complaining of frequent recurrent symptoms.  He has had 3 episodes of acute exacerbations over the past 2 months.  He was treated with multiple courses of antibiotics and systemic steroids.  However, he continues to have chronic nasal obstruction, facial pain, and facial pressure.  He also reports chronic postnasal drainage.  He is interested in more definitive treatment of his chronic conditions.   Exam: The nasal cavities were decongested and anesthetised with a combination of oxymetazoline and 4% lidocaine solution.  The flexible scope was inserted into the right nasal cavity.  Endoscopy of the inferior and middle meatus was performed.  Edematous polypoid mucosa was noted.   Significant nasal septal deviation. Nasopharynx was clear.  Turbinates were hypertrophied but without mass.  Incomplete response to decongestion.  The procedure was repeated on the contralateral side with similar findings.  The patient tolerated the procedure well.  Instructions were given to avoid eating or drinking for 2 hours.    Assessment: 1.  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  2.  Severe nasal septal deviation and bilateral  inferior turbinate hypertrophy.  He also has a large left concha bullosa.  3. The patient has not responded to medical treatment so far.   Plan: 1.  The nasal endoscopy findings and the CT images are reviewed with the patient.  2.  The patient is given another course of prednisone for 12 days.  3.  The patient was treated by an allergist without improvement in his symptoms.  4. Based on the above findings, he may benefit from undergoing bilateral endoscopic sinus surgery, septoplasty, bilateral turbinate reduction, and left concha bullosa resection.   5.  The risks, benefits, details of the procedure are reviewed with the patient.   6.  He would like to proceed with the procedures.

## 2017-08-04 NOTE — Transfer of Care (Signed)
Immediate Anesthesia Transfer of Care Note  Patient: Ronald Burton  Procedure(s) Performed: NASAL SEPTOPLASTY WITH TURBINATE REDUCTION (N/A Nose) LEFT CONCHA BULLOSA RESECTION (Left Nose) BILATERAL MAXILLARY ANTROSTOMY WITH TISSUE REMOVAL (Bilateral Nose) BILATERAL FRONTAL RECESS SINUS EXPLORATION (Bilateral Nose) BILATERAL ETHMOIDECTOMY WITH TISSUE REMOVAL (Bilateral Nose) BILATERAL SPHENOIDECTOMY WITH TISSUE REMOVAL (Bilateral Nose) SINUS ENDO WITH FUSION NAVIGATION (Bilateral Nose)  Patient Location: PACU  Anesthesia Type:General  Level of Consciousness: awake, sedated and patient cooperative  Airway & Oxygen Therapy: Patient Spontanous Breathing and aerosol face mask  Post-op Assessment: Report given to RN and Post -op Vital signs reviewed and stable  Post vital signs: Reviewed and stable  Last Vitals:  Vitals Value Taken Time  BP 140/92 08/04/2017 10:27 AM  Temp    Pulse 105 08/04/2017 10:29 AM  Resp 16 08/04/2017 10:29 AM  SpO2 99 % 08/04/2017 10:29 AM  Vitals shown include unvalidated device data.  Last Pain:  Vitals:   08/04/17 0639  TempSrc: Oral  PainSc: 0-No pain         Complications: No apparent anesthesia complications

## 2017-08-04 NOTE — Anesthesia Procedure Notes (Signed)
Procedure Name: Intubation Date/Time: 08/04/2017 7:38 AM Performed by: Gar GibbonKeeton, Keeghan Bialy S, CRNA Pre-anesthesia Checklist: Patient identified, Emergency Drugs available, Suction available and Patient being monitored Patient Re-evaluated:Patient Re-evaluated prior to induction Oxygen Delivery Method: Circle system utilized Preoxygenation: Pre-oxygenation with 100% oxygen Induction Type: IV induction Ventilation: Mask ventilation without difficulty Laryngoscope Size: Miller and 3 Grade View: Grade III Tube type: Oral Rae Number of attempts: 2 Airway Equipment and Method: Stylet,  Oral airway and Video-laryngoscopy Placement Confirmation: positive ETCO2 and breath sounds checked- equal and bilateral Tube secured with: Tape Dental Injury: Teeth and Oropharynx as per pre-operative assessment  Difficulty Due To: Difficulty was unanticipated Future Recommendations: Recommend- induction with short-acting agent, and alternative techniques readily available

## 2017-08-04 NOTE — Op Note (Signed)
DATE OF PROCEDURE: 08/04/2017  OPERATIVE REPORT   SURGEON: Newman Pies, MD   PREOPERATIVE DIAGNOSES:  1. Severe nasal septal deviation.  2. Bilateral inferior turbinate hypertrophy.  3. Left concha bullosa 4. Chronic nasal obstruction. 5. Bilateral chronic pansinusitis and sinonasal polyps.   POSTOPERATIVE DIAGNOSES:  1. Severe nasal septal deviation.  2. Bilateral inferior turbinate hypertrophy.  3. Left concha bullosa 4. Chronic nasal obstruction. 5. Bilateral chronic pansinusitis and sinonasal polyps.   PROCEDURE PERFORMED:  1. Bilateral endoscopic total ethmoidectomy and sphenoidotomy, with removal of polyps (CPT 31259) 2. Bilateral endoscopic frontal sinusotomy (CPT V5860500) 3. Bilateral endoscopic maxillary antrostomy with polyp removal (CPT 31267) 4. Septoplasty (CPT 30520)  5. Bilateral partial inferior turbinate resection (CPT 30130) 6. Left endoscopic concha bullosa resection (CPT 31240) 7. FUSION stereotactic image guidance (CPT [id number])  ANESTHESIA: General endotracheal tube anesthesia.   COMPLICATIONS: None.   ESTIMATED BLOOD LOSS: 200 mL.   INDICATION FOR PROCEDURE: Ronald Burton is a 47 y.o. male with a history of chronic nasal obstruction and chronic rhinosinusitis. The patient was previously treated with numerous courses of antibiotics, antihistamine, decongestant, steroid nasal spray, and systemic steroids. However, the patient continued to be symptomatic. He was also treated by an allergist without improvement in his symptoms. On examination, the patient was noted to have bilateral severe inferior turbinate hypertrophy and significant nasal septal deviation, causing significant nasal obstruction. Bilateral sinonasal polyps were noted. His CT scan showed bilateral chronic pansinusitis. Based on the above findings, the decision was made for the patient to undergo the above-stated procedures. The risks, benefits, alternatives, and details of the procedures were discussed  with the patient. Questions were invited and answered. Informed consent was obtained.   DESCRIPTION OF PROCEDURE: The patient was taken to the operating room and placed supine on the operating table. General endotracheal tube anesthesia was administered by the anesthesiologist. The patient was positioned, and prepped and draped in the standard fashion for nasal surgery. Pledgets soaked with Afrin were placed in both nasal cavities for decongestion. The pledgets were subsequently removed. The above mentioned severe septal deviation was again noted. 1% lidocaine with 1:100,000 epinephrine was injected onto the nasal septum bilaterally. A hemitransfixion incision was made on the left side. The mucosal flap was carefully elevated on the left side. A cartilaginous incision was made 1 cm superior to the caudal margin of the nasal septum. Mucosal flap was also elevated on the right side in the similar fashion. It should be noted that due to the severe septal deviation, the deviated portion of the cartilaginous and bony septum had to be removed in piecemeal fashion. Once the deviated portions were removed, a straight midline septum was achieved. The septum was then quilted with 4-0 plain gut sutures. The hemitransfixion incision was closed with interrupted 4-0 chromic sutures.   Attention was then focused on the inferior turbinates. The inferior one half of both hypertrophied inferior turbinate was crossclamped with a Kelly clamp. The inferior one half of each inferior turbinate was then resected with a pair of cross cutting scissors. Hemostasis was achieved with a suction cautery device.   The patient also had an enlarged left middle turbinate, secondary to a concha bullosa. Using a sickle knife and Tru-Cut forceps, the lateral portion of the concha bullosa was carefully resected. Hemostasis was achieved with the suction electrocautery.  Attention was then focused on the left paranasal sinuses. A 0 scope was used  to examine the left nasal cavity. The left middle turbinate was medialized. The  uncinate process was resected. Polypoid tissue was removed from the left middle meatus. The entrance to the left maxillary sinus was entered and enlarged using a combination of backbiter, Tru-Cut forceps, and microdebrider. Polypoid tissue was removed from the maxillary sinus. The bony partitions of the anterior and posterior ethmoid cavities were taken down using a combination of Tru-Cut forceps and microdebrider. Polypoid tissue was also removed. The sphenoid sinus opening was entered and enlarged using the microdebrider. Polypoid tissue was removed. Attention was then focused on the frontal sinus. The frontal recess was opened and enlarged. Polypoid tissue was removed from the frontal recess. All theparanasal sinuses were copiously irrigated with saline solution.  The same procedures were repeated on the right sinuses without exceptions. Hemostasis was achieved with nasopore packing. Doyle splints were applied to the septum.  The care of the patient was turned over to the anesthesiologist. The patient was awakened from anesthesia without difficulty. The patient was extubated and transferred to the recovery room in good condition.   OPERATIVE FINDINGS: Severe nasal septal deviation and bilateral inferior turbinate hypertrophy.  Left concha bullosa. Bilateral chronic pansinusitis and sinonasal polyps.  SPECIMEN: Bilateral sinus contents.   FOLLOWUP CARE: The patient be discharged home once he is awake and alert. The patient will be placed on Percocet 1 tablets p.o. q.4 hours p.r.n. pain, and amoxicillin 875 mg p.o. b.i.d. for 5 days. The patient will follow up in my office in approximately 3 days for splint removal.   Aftin Lye Philomena DohenyWooi Sanora Cunanan, MD

## 2017-08-04 NOTE — Discharge Instructions (Addendum)

## 2017-08-05 ENCOUNTER — Encounter (HOSPITAL_BASED_OUTPATIENT_CLINIC_OR_DEPARTMENT_OTHER): Payer: Self-pay | Admitting: Otolaryngology

## 2017-08-05 NOTE — Anesthesia Postprocedure Evaluation (Signed)
Anesthesia Post Note  Patient: Ronald Burton  Procedure(s) Performed: NASAL SEPTOPLASTY WITH TURBINATE REDUCTION (N/A Nose) LEFT CONCHA BULLOSA RESECTION (Left Nose) BILATERAL MAXILLARY ANTROSTOMY WITH TISSUE REMOVAL (Bilateral Nose) BILATERAL FRONTAL RECESS SINUS EXPLORATION (Bilateral Nose) BILATERAL ETHMOIDECTOMY WITH TISSUE REMOVAL (Bilateral Nose) BILATERAL SPHENOIDECTOMY WITH TISSUE REMOVAL (Bilateral Nose) SINUS ENDO WITH FUSION NAVIGATION (Bilateral Nose)     Patient location during evaluation: PACU Anesthesia Type: General Level of consciousness: awake and alert Pain management: pain level controlled Vital Signs Assessment: post-procedure vital signs reviewed and stable Respiratory status: spontaneous breathing, nonlabored ventilation, respiratory function stable and patient connected to nasal cannula oxygen Cardiovascular status: blood pressure returned to baseline and stable Postop Assessment: no apparent nausea or vomiting Anesthetic complications: no    Last Vitals:  Vitals:   08/04/17 1143 08/04/17 1245  BP: (!) 141/86 (!) 155/99  Pulse: 98 99  Resp: 18 18  Temp: 36.8 C 36.6 C  SpO2:  100%    Last Pain:  Vitals:   08/04/17 1245  TempSrc:   PainSc: 2                  Cecile HearingStephen Edward Kwamaine Cuppett

## 2017-08-07 DIAGNOSIS — J321 Chronic frontal sinusitis: Secondary | ICD-10-CM | POA: Diagnosis not present

## 2017-08-07 DIAGNOSIS — J322 Chronic ethmoidal sinusitis: Secondary | ICD-10-CM | POA: Diagnosis not present

## 2017-08-07 DIAGNOSIS — J320 Chronic maxillary sinusitis: Secondary | ICD-10-CM | POA: Diagnosis not present

## 2017-08-25 DIAGNOSIS — J321 Chronic frontal sinusitis: Secondary | ICD-10-CM | POA: Diagnosis not present

## 2017-08-25 DIAGNOSIS — J322 Chronic ethmoidal sinusitis: Secondary | ICD-10-CM | POA: Diagnosis not present

## 2017-08-25 DIAGNOSIS — J320 Chronic maxillary sinusitis: Secondary | ICD-10-CM | POA: Diagnosis not present

## 2017-10-02 DIAGNOSIS — G4733 Obstructive sleep apnea (adult) (pediatric): Secondary | ICD-10-CM | POA: Diagnosis not present

## 2017-10-15 ENCOUNTER — Encounter (HOSPITAL_COMMUNITY): Payer: Self-pay | Admitting: Emergency Medicine

## 2017-10-15 ENCOUNTER — Observation Stay (HOSPITAL_COMMUNITY)
Admission: EM | Admit: 2017-10-15 | Discharge: 2017-10-16 | Disposition: A | Discharge status: Home / Self Care | Payer: 59 | Attending: Cardiology | Admitting: Cardiology

## 2017-10-15 ENCOUNTER — Emergency Department (HOSPITAL_COMMUNITY): Payer: 59

## 2017-10-15 ENCOUNTER — Other Ambulatory Visit: Payer: Self-pay

## 2017-10-15 DIAGNOSIS — N400 Enlarged prostate without lower urinary tract symptoms: Secondary | ICD-10-CM | POA: Diagnosis not present

## 2017-10-15 DIAGNOSIS — H9319 Tinnitus, unspecified ear: Secondary | ICD-10-CM | POA: Insufficient documentation

## 2017-10-15 DIAGNOSIS — M170 Bilateral primary osteoarthritis of knee: Secondary | ICD-10-CM | POA: Diagnosis not present

## 2017-10-15 DIAGNOSIS — Z9119 Patient's noncompliance with other medical treatment and regimen: Secondary | ICD-10-CM | POA: Insufficient documentation

## 2017-10-15 DIAGNOSIS — Z823 Family history of stroke: Secondary | ICD-10-CM | POA: Diagnosis not present

## 2017-10-15 DIAGNOSIS — Z8249 Family history of ischemic heart disease and other diseases of the circulatory system: Secondary | ICD-10-CM | POA: Diagnosis not present

## 2017-10-15 DIAGNOSIS — R0789 Other chest pain: Secondary | ICD-10-CM

## 2017-10-15 DIAGNOSIS — G4733 Obstructive sleep apnea (adult) (pediatric): Secondary | ICD-10-CM | POA: Insufficient documentation

## 2017-10-15 DIAGNOSIS — F17290 Nicotine dependence, other tobacco product, uncomplicated: Secondary | ICD-10-CM | POA: Insufficient documentation

## 2017-10-15 DIAGNOSIS — F329 Major depressive disorder, single episode, unspecified: Secondary | ICD-10-CM | POA: Diagnosis not present

## 2017-10-15 DIAGNOSIS — E7849 Other hyperlipidemia: Secondary | ICD-10-CM | POA: Diagnosis not present

## 2017-10-15 DIAGNOSIS — Z888 Allergy status to other drugs, medicaments and biological substances status: Secondary | ICD-10-CM | POA: Insufficient documentation

## 2017-10-15 DIAGNOSIS — I499 Cardiac arrhythmia, unspecified: Secondary | ICD-10-CM | POA: Insufficient documentation

## 2017-10-15 DIAGNOSIS — Z833 Family history of diabetes mellitus: Secondary | ICD-10-CM | POA: Diagnosis not present

## 2017-10-15 DIAGNOSIS — F17200 Nicotine dependence, unspecified, uncomplicated: Secondary | ICD-10-CM | POA: Diagnosis present

## 2017-10-15 DIAGNOSIS — R002 Palpitations: Secondary | ICD-10-CM | POA: Insufficient documentation

## 2017-10-15 DIAGNOSIS — R072 Precordial pain: Secondary | ICD-10-CM

## 2017-10-15 DIAGNOSIS — R079 Chest pain, unspecified: Secondary | ICD-10-CM | POA: Diagnosis present

## 2017-10-15 DIAGNOSIS — K219 Gastro-esophageal reflux disease without esophagitis: Secondary | ICD-10-CM | POA: Diagnosis present

## 2017-10-15 DIAGNOSIS — Z8261 Family history of arthritis: Secondary | ICD-10-CM | POA: Insufficient documentation

## 2017-10-15 DIAGNOSIS — Z800 Family history of malignant neoplasm of digestive organs: Secondary | ICD-10-CM | POA: Insufficient documentation

## 2017-10-15 DIAGNOSIS — R42 Dizziness and giddiness: Secondary | ICD-10-CM | POA: Diagnosis not present

## 2017-10-15 DIAGNOSIS — G43909 Migraine, unspecified, not intractable, without status migrainosus: Secondary | ICD-10-CM | POA: Diagnosis not present

## 2017-10-15 DIAGNOSIS — F419 Anxiety disorder, unspecified: Secondary | ICD-10-CM | POA: Insufficient documentation

## 2017-10-15 DIAGNOSIS — I200 Unstable angina: Secondary | ICD-10-CM | POA: Diagnosis not present

## 2017-10-15 DIAGNOSIS — E785 Hyperlipidemia, unspecified: Secondary | ICD-10-CM | POA: Insufficient documentation

## 2017-10-15 DIAGNOSIS — I10 Essential (primary) hypertension: Secondary | ICD-10-CM | POA: Insufficient documentation

## 2017-10-15 DIAGNOSIS — R0602 Shortness of breath: Secondary | ICD-10-CM | POA: Diagnosis not present

## 2017-10-15 DIAGNOSIS — R32 Unspecified urinary incontinence: Secondary | ICD-10-CM | POA: Insufficient documentation

## 2017-10-15 DIAGNOSIS — Z79899 Other long term (current) drug therapy: Secondary | ICD-10-CM | POA: Diagnosis not present

## 2017-10-15 HISTORY — DX: Benign prostatic hyperplasia without lower urinary tract symptoms: N40.0

## 2017-10-15 HISTORY — DX: Tinnitus, unspecified ear: H93.19

## 2017-10-15 HISTORY — DX: Migraine, unspecified, not intractable, without status migrainosus: G43.909

## 2017-10-15 HISTORY — DX: Adverse effect of unspecified anesthetic, initial encounter: T41.45XA

## 2017-10-15 HISTORY — DX: Other complications of anesthesia, initial encounter: T88.59XA

## 2017-10-15 HISTORY — DX: Palpitations: R00.2

## 2017-10-15 LAB — CBC
HEMATOCRIT: 46.9 % (ref 39.0–52.0)
Hemoglobin: 15.9 g/dL (ref 13.0–17.0)
MCH: 27.3 pg (ref 26.0–34.0)
MCHC: 33.9 g/dL (ref 30.0–36.0)
MCV: 80.6 fL (ref 78.0–100.0)
PLATELETS: 360 10*3/uL (ref 150–400)
RBC: 5.82 MIL/uL — AB (ref 4.22–5.81)
RDW: 12.6 % (ref 11.5–15.5)
WBC: 9.3 10*3/uL (ref 4.0–10.5)

## 2017-10-15 LAB — BASIC METABOLIC PANEL
Anion gap: 12 (ref 5–15)
BUN: 7 mg/dL (ref 6–20)
CHLORIDE: 102 mmol/L (ref 101–111)
CO2: 25 mmol/L (ref 22–32)
CREATININE: 1.16 mg/dL (ref 0.61–1.24)
Calcium: 10.3 mg/dL (ref 8.9–10.3)
GFR calc non Af Amer: >60 mL/min (ref >60)
Glucose, Bld: 118 mg/dL — ABNORMAL HIGH (ref 65–99)
POTASSIUM: 3.6 mmol/L (ref 3.5–5.1)
SODIUM: 139 mmol/L (ref 135–145)

## 2017-10-15 LAB — D-DIMER, QUANTITATIVE (NOT AT ARMC): D DIMER QUANT: 0.35 ug{FEU}/mL (ref 0.00–0.50)

## 2017-10-15 LAB — TROPONIN I

## 2017-10-15 LAB — RAPID URINE DRUG SCREEN, HOSP PERFORMED
Amphetamines: NOT DETECTED
BENZODIAZEPINES: NOT DETECTED
Barbiturates: NOT DETECTED
Cocaine: NOT DETECTED
Opiates: NOT DETECTED
Tetrahydrocannabinol: NOT DETECTED

## 2017-10-15 LAB — I-STAT TROPONIN, ED: Troponin i, poc: 0.01 ng/mL (ref 0.00–0.08)

## 2017-10-15 LAB — CBG MONITORING, ED: GLUCOSE-CAPILLARY: 101 mg/dL — AB (ref 65–99)

## 2017-10-15 MED ORDER — ENOXAPARIN SODIUM 40 MG/0.4ML ~~LOC~~ SOLN
40.0000 mg | SUBCUTANEOUS | Status: DC
  Administered 2017-10-15: 40 mg via SUBCUTANEOUS
  Filled 2017-10-15: qty 0.4

## 2017-10-15 MED ORDER — LORATADINE 10 MG PO TABS: 10.0000 mg | ORAL_TABLET | Freq: Every day | ORAL | Status: DC

## 2017-10-15 MED ORDER — METOPROLOL SUCCINATE ER 50 MG PO TB24
50.0000 mg | ORAL_TABLET | Freq: Every day | ORAL | Status: DC
  Administered 2017-10-15: 50 mg via ORAL
  Filled 2017-10-15: qty 1

## 2017-10-15 MED ORDER — GI COCKTAIL ~~LOC~~: 30.0000 mL | Freq: Four times a day (QID) | ORAL | Status: DC | PRN

## 2017-10-15 MED ORDER — FENTANYL CITRATE (PF) 100 MCG/2ML IJ SOLN
100.0000 ug | Freq: Once | INTRAMUSCULAR | Status: AC
  Administered 2017-10-15: 100 ug via INTRAVENOUS
  Filled 2017-10-15: qty 2

## 2017-10-15 MED ORDER — ONDANSETRON HCL 4 MG/2ML IJ SOLN: 4.0000 mg | Freq: Four times a day (QID) | INTRAMUSCULAR | Status: DC | PRN

## 2017-10-15 MED ORDER — METOPROLOL SUCCINATE ER 50 MG PO TB24: 50.0000 mg | ORAL_TABLET | Freq: Every day | ORAL | Status: DC

## 2017-10-15 MED ORDER — ACETAMINOPHEN 325 MG PO TABS: 650.0000 mg | ORAL_TABLET | ORAL | Status: DC | PRN

## 2017-10-15 MED ORDER — PANTOPRAZOLE SODIUM 40 MG PO TBEC
40.0000 mg | DELAYED_RELEASE_TABLET | Freq: Every day | ORAL | Status: DC
  Administered 2017-10-15: 40 mg via ORAL
  Filled 2017-10-15: qty 1

## 2017-10-15 MED ORDER — MORPHINE SULFATE (PF) 2 MG/ML IV SOLN
2.0000 mg | INTRAVENOUS | Status: DC | PRN
  Administered 2017-10-15: 2 mg via INTRAVENOUS
  Filled 2017-10-15: qty 1

## 2017-10-15 MED ORDER — SODIUM CHLORIDE 0.9 % IV BOLUS
1000.0000 mL | Freq: Once | INTRAVENOUS | Status: AC
  Administered 2017-10-15: 1000 mL via INTRAVENOUS

## 2017-10-15 MED ORDER — BUSPIRONE HCL 10 MG PO TABS
10.0000 mg | ORAL_TABLET | Freq: Two times a day (BID) | ORAL | Status: DC
  Filled 2017-10-15 (×3): qty 1

## 2017-10-15 MED ORDER — SIMVASTATIN 20 MG PO TABS
20.0000 mg | ORAL_TABLET | Freq: Every evening | ORAL | Status: DC
  Administered 2017-10-15: 20 mg via ORAL
  Filled 2017-10-15: qty 1

## 2017-10-15 MED ORDER — ASPIRIN 81 MG PO CHEW
324.0000 mg | CHEWABLE_TABLET | Freq: Once | ORAL | Status: AC
  Administered 2017-10-15: 324 mg via ORAL
  Filled 2017-10-15: qty 4

## 2017-10-15 MED ORDER — ASPIRIN EC 81 MG PO TBEC: 81.0000 mg | DELAYED_RELEASE_TABLET | Freq: Every day | ORAL | Status: DC

## 2017-10-15 MED ORDER — ONDANSETRON HCL 4 MG/2ML IJ SOLN
4.0000 mg | Freq: Once | INTRAMUSCULAR | Status: AC
  Administered 2017-10-15: 4 mg via INTRAVENOUS
  Filled 2017-10-15: qty 2

## 2017-10-15 MED ORDER — TOPIRAMATE 25 MG PO TABS: 25.0000 mg | ORAL_TABLET | Freq: Every day | ORAL | Status: DC | PRN

## 2017-10-15 NOTE — ED Provider Notes (Signed)
[facility] EMERGENCY DEPARTMENT Provider Note   CSN: [id number] Arrival date & time: 10/15/17  1120     History   Chief Complaint Chief Complaint  Patient presents with  . Chest Pain    HPI Ronald Burton is a 47 y.o. male past medical history of anxiety, GERD, hyperlipidemia, hypertension, irregular heartbeat who presents for evaluation of intermittent chest pain and shortness of breath that began to get worse today.  Patient reports that he was at work this morning at approximately 10:30 AM and states that he started getting very diaphoretic and nauseous.  Patient starts states he started having left-sided chest pain that he describes as sharp in nature.  He states that the pain radiated around the left side of his chest.  Patient reports that he started having difficulty breathing associated with the pain.  He states that his pain was not worse with deep inspiration.  Patient reports that this is been ongoing for the last 2 weeks and has been very intermittent.  Patient reports that he first noticed it 2 weeks ago and states that when he went to go move the lawn, he would have chest pain that was worse with exertion.  States that it would improve if he stopped.  Patient reports that this is been ongoing for the last 2 weeks.  He has not sought evaluation.  Patient reports he has seen a cardiologist before 2018 for evaluation of palpitations.  No other cardiac history.  No family cardiac history.  Patient reports he smokes tobacco.  Denies any cocaine use.  Does report a history of hyperlipidemia, hypertension.  Patient does report he had recent surgery approximately 8 weeks ago.  He states he did surgery for sinus but did not require any overnight hospitalization.  Denies any history of blood clots, history of immobilizations, recent travel.  Denies any recent fevers, cough, congestion, bowel pain, nausea/vomiting.  The history is provided by the patient.    Past Medical  History:  Diagnosis Date  . Anxiety   . Arthritis    knees  . Depression   . Deviated septum   . GERD (gastroesophageal reflux disease)   . Headache   . Hyperlipidemia   . Hypertension   . Irregular heart beat   . OSA (obstructive sleep apnea)    does not use CPAP  . Seasonal allergies   . Urine incontinence     Patient Active Problem List   Diagnosis Date Noted  . Recurrent sinusitis 06/11/2017  . Allergic rhinitis 12/24/2016  . Hyperlipidemia 12/24/2016  . Preventative health care 12/24/2016  . Essential hypertension 02/27/2016  . BPH (benign prostatic hyperplasia) 02/27/2016  . Arthralgia 02/27/2016  . GERD (gastroesophageal reflux disease) 02/27/2016    Past Surgical History:  Procedure Laterality Date  . ETHMOIDECTOMY Bilateral 08/04/2017   Procedure: BILATERAL ETHMOIDECTOMY WITH TISSUE REMOVAL;  Surgeon: Newman Pies, MD;  Location: [facility];  Service: ENT;  Laterality: Bilateral;  . EXCISION CHONCHA BULLOSA Left 08/04/2017   Procedure: LEFT CONCHA BULLOSA RESECTION;  Surgeon: Newman Pies, MD;  Location: [facility];  Service: ENT;  Laterality: Left;  . FRONTAL SINUS EXPLORATION Bilateral 08/04/2017   Procedure: BILATERAL FRONTAL RECESS SINUS EXPLORATION;  Surgeon: Newman Pies, MD;  Location: [facility];  Service: ENT;  Laterality: Bilateral;  . MAXILLARY ANTROSTOMY Bilateral 08/04/2017   Procedure: BILATERAL MAXILLARY ANTROSTOMY WITH TISSUE REMOVAL;  Surgeon: Newman Pies, MD;  Location:  SURGERY CENTER;  Service: ENT;  Laterality: Bilateral;  . NASAL SEPTOPLASTY W/ TURBINOPLASTY N/A 08/04/2017   Procedure: NASAL SEPTOPLASTY WITH TURBINATE REDUCTION;  Surgeon: Newman Pieseoh, Su, MD;  Location: [facility];  Service: ENT;  Laterality: N/A;  . SINUS ENDO WITH FUSION Bilateral 08/04/2017   Procedure: SINUS ENDO WITH FUSION NAVIGATION;  Surgeon: Newman Pieseoh, Su, MD;  Location: [facility];  Service: ENT;  Laterality:  Bilateral;  . SPHENOIDECTOMY Bilateral 08/04/2017   Procedure: BILATERAL SPHENOIDECTOMY WITH TISSUE REMOVAL;  Surgeon: Newman Pieseoh, Su, MD;  Location: [facility];  Service: ENT;  Laterality: Bilateral;        Home Medications    Prior to Admission medications   Medication Sig Start Date End Date Taking? Authorizing Provider  busPIRone (BUSPAR) 5 MG tablet Take 10 mg by mouth 2 (two) times daily.    [provider]  cetirizine (ZYRTEC) 10 MG tablet Take 10 mg by mouth daily.    [provider]  DEXILANT 60 MG capsule TAKE 1 CAPSULE BY MOUTH  DAILY 05/30/16   Doreene Nestlark, Katherine K, NP  diclofenac sodium (VOLTAREN) 1 % GEL Apply 2-4 g topically daily as needed (for knee pain).  06/27/16   [provider]  meclizine (ANTIVERT) 25 MG tablet Take 1 tablet (25 mg total) by mouth 3 (three) times daily as needed for dizziness. 01/14/17   Vanetta MuldersZackowski, Scott, MD  metoprolol succinate (TOPROL XL) 50 MG 24 hr tablet Take 1 tablet (50 mg total) by mouth daily. Take with or immediately following a meal. 04/27/17   Lennette BihariKelly, Thomas A, MD  ondansetron (ZOFRAN ODT) 4 MG disintegrating tablet Take 1 tablet (4 mg total) by mouth every 8 (eight) hours as needed for nausea or vomiting. 01/14/17   Vanetta MuldersZackowski, Scott, MD  oxyCODONE-acetaminophen (PERCOCET/ROXICET) 5-325 MG tablet Take 1 tablet by mouth every 4 (four) hours as needed for severe pain. 08/04/17   Newman Pieseoh, Su, MD  promethazine (PHENERGAN) 25 MG tablet Take 1 tablet (25 mg total) by mouth every 6 (six) hours as needed for nausea or vomiting. 01/14/17   Vanetta MuldersZackowski, Scott, MD  simvastatin (ZOCOR) 20 MG tablet Take 20 mg by mouth every evening.     [provider]  topiramate (TOPAMAX) 25 MG tablet Take 25 mg by mouth daily as needed (at onset of headaches).     [provider]  UNABLE TO FIND CPAP: At bedtime    [provider]    Family History Family History  Problem Relation Age of Onset  . Arthritis Mother     . Hyperlipidemia Mother   . Hypertension Mother   . Stroke Mother   . Diabetes Mother   . Stomach cancer Mother   . Hyperlipidemia Father   . Heart disease Father   . Hypertension Father     Social History Social History   Tobacco Use  . Smoking status: Never Smoker  . Smokeless tobacco: Current User    Types: Chew  Substance Use Topics  . Alcohol use: Yes    Comment: social  . Drug use: No     Allergies   Paxil [paroxetine]   Review of Systems Review of Systems  Constitutional: Positive for diaphoresis. Negative for chills and fever.  HENT: Negative for congestion.   Eyes: Negative for visual disturbance.  Respiratory: Positive for shortness of breath. Negative for cough.   Cardiovascular: Positive for chest pain.  Gastrointestinal: Positive for nausea. Negative for abdominal pain and vomiting.  Genitourinary: Negative for dysuria and hematuria.  Musculoskeletal:  Negative for back pain and neck pain.  Skin: Negative for rash.  Neurological: Positive for dizziness. Negative for weakness, numbness and headaches.  All other systems reviewed and are negative.    Physical Exam Updated Vital Signs BP (!) 131/93   Pulse 89   Temp 98.7 F (37.1 C) (Oral)   Resp 11   SpO2 100%   Physical Exam  Constitutional: He is oriented to person, place, and time. He appears well-developed and well-nourished.  Appears very anxious.   HENT:  Head: Normocephalic and atraumatic.  Mouth/Throat: Oropharynx is clear and moist and mucous membranes are normal.  Eyes: Pupils are equal, round, and reactive to light. Conjunctivae, EOM and lids are normal.  Neck: Full passive range of motion without pain.  Cardiovascular: Normal rate, regular rhythm, normal heart sounds and normal pulses. Exam reveals no gallop and no friction rub.  No murmur heard. Pulses:      Radial pulses are 2+ on the right side, and 2+ on the left side.       Dorsalis pedis pulses are 2+ on the right side, and  2+ on the left side.  Pulmonary/Chest: Effort normal and breath sounds normal.  Lungs clear to auscultation bilaterally.  Symmetric chest rise.  No wheezing, rales, rhonchi.  Abdominal: Soft. Normal appearance. There is no tenderness. There is no rigidity and no guarding.  Musculoskeletal: Normal range of motion.  Bilateral lower extremities symmetric in appearance.  Neurological: He is alert and oriented to person, place, and time.  Skin: Skin is warm. Capillary refill takes less than 2 seconds. He is diaphoretic. There is pallor.  Psychiatric: He has a normal mood and affect. His speech is normal.  Nursing note and vitals reviewed.    ED Treatments / Results  Labs (all labs ordered are listed, but only abnormal results are displayed) Labs Reviewed  BASIC METABOLIC PANEL - Abnormal; Notable for the following components:      Result Value   Glucose, Bld 118 (*)    All other components within normal limits  CBC - Abnormal; Notable for the following components:   RBC 5.82 (*)    All other components within normal limits  CBG MONITORING, ED - Abnormal; Notable for the following components:   Glucose-Capillary 101 (*)    All other components within normal limits  D-DIMER, QUANTITATIVE (NOT AT [facility])  I-STAT TROPONIN, ED    EKG EKG Interpretation  Date/Time:  Thursday October 15 2017 11:25:40 EDT Ventricular Rate:  100 PR Interval:  166 QRS Duration: 82 QT Interval:  334 QTC Calculation: 430 R Axis:   83 Text Interpretation:  Normal sinus rhythm Nonspecific T wave abnormality Abnormal ECG Confirmed by Lorre Nick ([id number]) on 10/15/2017 1:37:49 PM   Radiology Dg Chest 2 View  Result Date: 10/15/2017 CLINICAL DATA:  Chest pain EXAM: CHEST - 2 VIEW COMPARISON:  03/03/2017 chest radiograph. FINDINGS: Stable cardiomediastinal silhouette with normal heart size. No pneumothorax. No pleural effusion. Lungs appear clear, with no acute consolidative airspace disease and no pulmonary edema.  IMPRESSION: No active cardiopulmonary disease. Electronically Signed   By: Delbert Phenix M.D.   On: 10/15/2017 12:32    Procedures Procedures (including critical care time)  Medications Ordered in ED Medications  aspirin chewable tablet 324 mg (has no administration in time range)  ondansetron (ZOFRAN) injection 4 mg (4 mg Intravenous Given 10/15/17 1251)  sodium chloride 0.9 % bolus 1,000 mL (1,000 mLs Intravenous New Bag/Given 10/15/17 1251)  fentaNYL (SUBLIMAZE) injection 100  mcg (100 mcg Intravenous Given 10/15/17 1251)     Initial Impression / Assessment and Plan / ED Course  I have reviewed the triage vital signs and the nursing notes.  Pertinent labs & imaging results that were available during my care of the patient were reviewed by me and considered in my medical decision making (see chart for details).     47 year old male with past medical history of hypertension, hyperlipidemia, GERD, anxiety who presents for evaluation of chest pain that began today at 10:30 AM.  Associated with nausea, diaphoresis, difficulty breathing.  Reports that this is been intermittently ongoing for the last 2 weeks.  Reports it is worse with exertion but not worse with deep inspiration.  States that when he would go to mow the lawn, he would get chest pain.  Reports history of palpitations but no other cardiac history.  No family cardiac history.  Does have a history of hyperlipidemia, hypertension.  Does smoke tobacco.  Patient came POV.  In triage, patient was diaphoretic, pale.  He was rushed back to the main ED immediately.  On my evaluation, he was still diaphoretic and pale and generalized weak.  Equal pulses in all 4 extremities.  Lungs clear to auscultation.  Consider ACS etiology versus infectious etiology versus lecture light imbalance.  Also consider PE given history of recent surgery, shortness of breath, tachycardia.  I did check basic labs.  CBGs shows 101.  CBC without any significant  leukocytosis, anemia.  BMP unremarkable.  Initial troponin negative.  D-dimer negative.  EKG shows Nonspecific wave abnormalities.  No other acute abnormalities.  Chest x-ray negative for any acute infectious etiology.  Patient's symptoms have been ongoing for the last 2 weeks.  He states that when he exerts himself, he starts having chest pain, gets diaphoretic, short of breath.  Reports it is improved at rest.  Review of records show that he has seen cardiology (Dr. Tresa Endo) for evaluation of palpitations.  He had an echo a year ago which was unremarkable but is never had a stress or cath.  Patient has a heart score of 5.  Given concerning presentation and risk factors, feel that patient needs further evaluation here in the hospital.  Will consult for admission.   Discussed patient with Dr. Ophelia Charter (hosiptalist). Will plan for admission.    Final Clinical Impressions(s) / ED Diagnoses   Final diagnoses:  Chest pain, unspecified type    ED Discharge Orders    None       Maxwell Caul, PA-C 10/15/17 1416    Lorre Nick, MD 10/17/17 1200

## 2017-10-15 NOTE — H&P (Signed)
History and Physical    [name] WUJ:[id number] DOB: 1970/06/05 DOA: 10/15/2017  PCP: Doreene Nest, NP Consultants:  [facility]; [facility] - ENT; [facility] Endo - cardiology Patient coming from:  Home - lives with wife; NOK: Wife, [phone]  Chief Complaint: chest pain  HPI: Ronald Burton is a 47 y.o. male with medical history significant of OSA not on CPAP; HTN; GERD; and HLD presenting with chest pain.  He has been feeling weak with palpitations.  Also with nausea.  Symptoms off and on for years.  Worse in the last few weeks.  He had sinus surgery in March and after the surgery was mowing grass and symptoms started getting worse.  He is taking medication for anxiety and palpitations and it doesn't seem to help it from getting worse.  He has difficulty walking at work from fatigue.  He has intermittent chest pain that is hard to differentiate from GERD.   He has been feeling bad the last few days - tired, difficulty sleeping, heart fluttering.  This AM, he was weak and dizzy and then he started losing all his energy andf his heart wouldn't slow down.  His chest was hurting and his left arm was tingling.  Very nauseated and dizzy.  Substernal chest pain.  Non-exertional.  It still hurts but not as much now.  His episodes are limiting his ability to do activities at work, home.  Symptoms have been worse over the last 2 weeks.  He had an echo in 11/18.  Treadmill stress test in [place] 3-4 years ago.   ED Course:   Chest pain r/o.  Sharp left-sided CP.  In triage, he was pale, diaphoretic.  In discussion, he has had symptoms for 2-3 weeks.  Intermittent CP with significant exertion.  Cards saw him in 2018 for palpitations, negative Echo but no h/o stress test.  Troponin negative.  EKG with nonspecific T wave changes.  Review of Systems: As per HPI; otherwise review of systems reviewed and negative.   Ambulatory Status:  Ambulates without assistance  Past Medical History:  Diagnosis Date  . Anxiety   .  Arthritis    knees  . BPH (benign prostatic hyperplasia)   . Depression   . Deviated septum   . GERD (gastroesophageal reflux disease)   . Headache   . Hyperlipidemia   . Hypertension   . Irregular heart beat   . Migraines   . OSA (obstructive sleep apnea)    does not use CPAP  . Palpitations   . Seasonal allergies   . Tinnitus   . Urine incontinence     Past Surgical History:  Procedure Laterality Date  . ETHMOIDECTOMY Bilateral 08/04/2017   Procedure: BILATERAL ETHMOIDECTOMY WITH TISSUE REMOVAL;  Surgeon: Newman Pies, MD;  Location: [facility];  Service: ENT;  Laterality: Bilateral;  . EXCISION CHONCHA BULLOSA Left 08/04/2017   Procedure: LEFT CONCHA BULLOSA RESECTION;  Surgeon: Newman Pies, MD;  Location: [facility];  Service: ENT;  Laterality: Left;  . FRONTAL SINUS EXPLORATION Bilateral 08/04/2017   Procedure: BILATERAL FRONTAL RECESS SINUS EXPLORATION;  Surgeon: Newman Pies, MD;  Location: [facility];  Service: ENT;  Laterality: Bilateral;  . MAXILLARY ANTROSTOMY Bilateral 08/04/2017   Procedure: BILATERAL MAXILLARY ANTROSTOMY WITH TISSUE REMOVAL;  Surgeon: Newman Pies, MD;  Location: [facility];  Service: ENT;  Laterality: Bilateral;  . NASAL SEPTOPLASTY W/ TURBINOPLASTY N/A 08/04/2017   Procedure: NASAL SEPTOPLASTY WITH TURBINATE REDUCTION;  Surgeon: Newman Pies, MD;  Location: [facility];  Service: ENT;  Laterality: N/A;  . SINUS ENDO WITH FUSION Bilateral 08/04/2017   Procedure: SINUS ENDO WITH FUSION NAVIGATION;  Surgeon: Newman Pies, MD;  Location: [facility];  Service: ENT;  Laterality: Bilateral;  . SPHENOIDECTOMY Bilateral 08/04/2017   Procedure: BILATERAL SPHENOIDECTOMY WITH TISSUE REMOVAL;  Surgeon: Newman Pies, MD;  Location: [facility];  Service: ENT;  Laterality: Bilateral;    Social History   Socioeconomic History  . Marital status: Married    Spouse name: Not on file  . Number  of children: Not on file  . Years of education: Not on file  . Highest education level: Not on file  Occupational History  . 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 Financial resource strain: Not on file  . Food insecurity:    Worry: Not on file    Inability: Not on file  . Transportation needs:    Medical: Not on file    Non-medical: Not on file  Tobacco Use  . Smoking status: Never Smoker  . Smokeless tobacco: Current User    Types: Chew  . Tobacco comment: he does need a patch  Substance and Sexual Activity  . Alcohol use: Yes    Comment: social  . Drug use: No  . Sexual activity: Not on file  Lifestyle  . Physical activity:    Days per week: Not on file    Minutes per session: Not on file  . Stress: Not on file  Relationships  . Social connections:    Talks on phone: Not on file    Gets together: Not on file    Attends religious service: Not on file    Active member of club or organization: Not on file    Attends meetings of clubs or organizations: Not on file    Relationship status: Not on file  . Intimate partner violence:    Fear of current or ex partner: Not on file    Emotionally abused: Not on file    Physically abused: Not on file    Forced sexual activity: Not on file  Other Topics Concern  . Not on file  Social History Narrative   Married.   6 children, 1 grandchild.   Works as a Art therapist   Enjoys spending time with family, video games.     Allergies  Allergen Reactions  . Paxil [Paroxetine] Other (See Comments)    Induced panic    Family History  Problem Relation Age of Onset  . Arthritis Mother   . Hyperlipidemia Mother   . Hypertension Mother   . Stroke Mother   . Diabetes Mother   . Stomach cancer Mother   . Hyperlipidemia Father   . Heart disease Father 2  . Hypertension Father     Prior to Admission medications   Medication Sig Start Date End Date Taking? Authorizing Provider  busPIRone (BUSPAR) 5 MG tablet  Take 10 mg by mouth 2 (two) times daily.    [provider]  cetirizine (ZYRTEC) 10 MG tablet Take 10 mg by mouth daily.    [provider]  DEXILANT 60 MG capsule TAKE 1 CAPSULE BY MOUTH  DAILY 05/30/16   Doreene Nest, NP  diclofenac sodium (VOLTAREN) 1 % GEL Apply 2-4 g topically daily as needed (for knee pain).  06/27/16   [provider]  meclizine (ANTIVERT) 25 MG tablet Take 1 tablet (25 mg total) by mouth  3 (three) times daily as needed for dizziness. 01/14/17   Vanetta Mulders, MD  metoprolol succinate (TOPROL XL) 50 MG 24 hr tablet Take 1 tablet (50 mg total) by mouth daily. Take with or immediately following a meal. 04/27/17   Lennette Bihari, MD  ondansetron (ZOFRAN ODT) 4 MG disintegrating tablet Take 1 tablet (4 mg total) by mouth every 8 (eight) hours as needed for nausea or vomiting. 01/14/17   Vanetta Mulders, MD  oxyCODONE-acetaminophen (PERCOCET/ROXICET) 5-325 MG tablet Take 1 tablet by mouth every 4 (four) hours as needed for severe pain. 08/04/17   Newman Pies, MD  promethazine (PHENERGAN) 25 MG tablet Take 1 tablet (25 mg total) by mouth every 6 (six) hours as needed for nausea or vomiting. 01/14/17   Vanetta Mulders, MD  simvastatin (ZOCOR) 20 MG tablet Take 20 mg by mouth every evening.     [provider]  topiramate (TOPAMAX) 25 MG tablet Take 25 mg by mouth daily as needed (at onset of headaches).     [provider]  UNABLE TO FIND CPAP: At bedtime    [provider]    Physical Exam: Vitals:   10/15/17 1345 10/15/17 1400 10/15/17 1415 10/15/17 1430  BP: 135/89 (!) 132/92 (!) 127/99 (!) 134/99  Pulse: 86 88 74 76  Resp: 14 20 13 13   Temp:      TempSrc:      SpO2: 99% 99% 100% 100%     General:  Appears calm and comfortable and is NAD Eyes:  PERRL, EOMI, normal lids, iris ENT:  grossly normal hearing, lips & tongue, mmm Neck:  no LAD, masses or thyromegaly; no carotid bruits Cardiovascular:  RRR, no m/r/g. No  LE edema.  Respiratory:   CTA bilaterally with no wheezes/rales/rhonchi.  Normal respiratory effort. Abdomen:  soft, NT, ND, NABS Skin:  no rash or induration seen on limited exam Musculoskeletal:  grossly normal tone BUE/BLE, good ROM, no bony abnormality Psychiatric:  grossly normal mood and affect, speech fluent and appropriate, AOx3 Neurologic:  CN 2-12 grossly intact, moves all extremities in coordinated fashion, sensation intact    Radiological Exams on Admission: Dg Chest 2 View  Result Date: 10/15/2017 CLINICAL DATA:  Chest pain EXAM: CHEST - 2 VIEW COMPARISON:  03/03/2017 chest radiograph. FINDINGS: Stable cardiomediastinal silhouette with normal heart size. No pneumothorax. No pleural effusion. Lungs appear clear, with no acute consolidative airspace disease and no pulmonary edema. IMPRESSION: No active cardiopulmonary disease. Electronically Signed   By: Delbert Phenix M.D.   On: 10/15/2017 12:32    EKG: Independently reviewed.  NSR with rate 100; nonspecific ST changes with no evidence of acute ischemia   Labs on Admission: I have personally reviewed the available labs and imaging studies at the time of the admission.  Pertinent labs:   Glucose 118 Troponin 0.01 Essentially negative CBC D-dimer 0.35 Normal thyroid testing in 10/18 A1c 5.3 in 7/18 Lipids in 7/18: 129/41/77/54  Assessment/Plan Principal Problem:   Chest pain Active Problems:   Essential hypertension   GERD (gastroesophageal reflux disease)   Hyperlipidemia   OSA (obstructive sleep apnea)   Tobacco dependence   Chest pain -Patient with substernal chest pressure.  By the ER history, this was concerning for unstable angina.  However, the patient appears to describe chest pain in conjunction with an array of symptoms including fatigue, palpitations. -CXR unremarkable.   -Initial cardiac troponin negative.  -EKG not indicative of acute ischemia.   -GRACE score is 75; which predicts  an in-hospital death  rate of 0.4%.  -Will plan to place in observation status on telemetry to rule out ACS by overnight observation.  -cycle troponin q6h x 3 and repeat EKG in AM -Start ASA 81 mg daily -morphine given -Risk factor stratification with FLP; will also check UDS -Cardiology consultation in AM - NPO for possible stress test   HTN -Takes Toprol XL monotherapy at home -Will continue  HLD -Continue Zocor -Last LDL (7/18) was 77, so almost <70  Hyperglycemia -Prior A1c was 5.3, so patient was not diabetic -Will monitor fasting glucose to determine if further evaluation/treatment is needed  OSA -Patient has had difficulty tolerating both full face mask and nasal prongs -Will do trial of CPAP with nasal mask -Will use auto-PAP -Many of his symptoms may result from inadequate sleep resulting from untreated CPAP; compliance was encouraged  Tobacco dependence -Encourage cessation.  This was discussed with the patient and should be reviewed on an ongoing basis.   -Patch ordered at patient request.     DVT prophylaxis: Lovenox  Code Status:  Full - confirmed with patient/family Family Communication: Wife present throughout evaluation  Disposition Plan:  Home once clinically improved Consults called: Cardiology - to see in AM  Admission status: It is my clinical opinion that referral for OBSERVATION is reasonable and necessary in this patient based on the above information provided. The aforementioned taken together are felt to place the patient at high risk for further clinical deterioration. However it is anticipated that the patient may be medically stable for discharge from the hospital within 24 to 48 hours.    Jonah BlueJennifer Judd Mccubbin MD [facility]  If note is complete, please contact covering daytime or nighttime physician. [email or web address] Password [email or web address]  10/15/2017, 2:48 PM

## 2017-10-15 NOTE — ED Triage Notes (Signed)
Patient complains of heavy chest pain, weakness, shortness of breath, dizziness, nausea and heart palpitations that started two weeks ago. Patient alert, oriented, and very anxious at this time.

## 2017-10-15 NOTE — Progress Notes (Signed)
Patient arrived to 4E room 27 from the ED where he presented with chest pain, nausea and vomiting.  Telemetry monitor applied and CCMD notified.  Patient oriented to unit and room to include call light and phone.  Will continue to monitor.

## 2017-10-15 NOTE — ED Provider Notes (Signed)
Medical screening examination/treatment/procedure(s) were conducted as a shared visit with non-physician practitioner(s) and myself.  I personally evaluated the patient during the encounter.  EKG Interpretation  Date/Time:  Thursday October 15 2017 11:25:40 EDT Ventricular Rate:  100 PR Interval:  166 QRS Duration: 82 QT Interval:  334 QTC Calculation: 430 R Axis:   83 Text Interpretation:  Normal sinus rhythm Nonspecific T wave abnormality Abnormal ECG Confirmed by Lorre NickAllen, Maisy Newport ([id number]) on 10/15/2017 [id number] PM 47 year old male presents with exertional chest pain times several days which gets better with rest.  EKG without acute ischemic changes.  Troponin negative.  However given his clinical picture patient will require further provocative testing for evaluation of possible acute coronary syndrome   Lorre NickAllen, Jaze Rodino, MD 10/15/17 1343

## 2017-10-15 NOTE — H&P (View-Only) (Signed)
Cardiology Consultation:   Patient ID: Ronald Burton; [id number]; 1970-06-03   Admit date: 10/15/2017 Date of Consult: 10/15/2017  Primary Care Provider: Doreene Nest, NP Primary Cardiologist: Nicki Guadalajara, MD    Patient Profile:   Ronald Burton is a 47 y.o. male with a hx of HTN, HLD, family h/o premature CAD, palpitations, GERD, OSA noncompliant with CPAP, migraine HAs and anxiety, who is being seen today for the evaluation of chest pain at the request of Dr. Ophelia Charter, Internal Medicine.  History of Present Illness:   Ronald Burton is a former resident of [place]. He had a stress test while living there several years ago and was told it was normal. Cardiac risk factors include HTN, HLD and family h/o premature CAD (father had MI in his mid 31s). Also has GERD and takes Dexilant. Has OSA but noncompliant with CPAP. Cannot tolerate the device. Uncomfortable for patient. H/o migraine headaches and takes topiramate. He was referred to Dr. Tresa Endo in 02/2017 for palpitations and atypical chest pain. His free T4 was 1.23, TSH 1.04.  Magnesium was minimally increased at 2.4. Echo showed normal LVEF and normal diastolic function. Dr. Tresa Endo initiated beta blocker therapy and started metoprolol. Pt was seen back for revaluation several weeks later in 04/2017 and noted significant improvement with resolution of palpitations and CP. No further w/u pursued. He was instructed to return back in 1 year or sooner if needed.  Pt now presents to the [facility] ED with complaint of chest pain and exertional dyspnea. Symptoms have been present for several weeks but progressively worsening. He notes decreased exercise tolerance. With any type of physical activity, he gives out. Always tired. Gets SOB with activity and has exertional CP, described as chest pressure. Symptoms get better with rest. He traveled to [place] about 1 month ago, but by plane. Short flight. No LEE or leg pain. He notes h/o GERD but has been compliant with  Dexilant. He has also recently added Zantac and has been using Pepto Bismuth over the last few weeks w/o any improvement in symptoms.   POC troponin is negative x 1. D-dimer is negative. CBC and BMP unremarkable. Hgb is 15.  CXR negative. VSS. CP free at rest.    Past Medical History:  Diagnosis Date  . Anxiety   . Arthritis    knees  . BPH (benign prostatic hyperplasia)   . Depression   . Deviated septum   . GERD (gastroesophageal reflux disease)   . Headache   . Hyperlipidemia   . Hypertension   . Irregular heart beat   . Migraines   . OSA (obstructive sleep apnea)    does not use CPAP  . Palpitations   . Seasonal allergies   . Tinnitus   . Urine incontinence     Past Surgical History:  Procedure Laterality Date  . ETHMOIDECTOMY Bilateral 08/04/2017   Procedure: BILATERAL ETHMOIDECTOMY WITH TISSUE REMOVAL;  Surgeon: Newman Pies, MD;  Location: [facility];  Service: ENT;  Laterality: Bilateral;  . EXCISION CHONCHA BULLOSA Left 08/04/2017   Procedure: LEFT CONCHA BULLOSA RESECTION;  Surgeon: Newman Pies, MD;  Location: [facility];  Service: ENT;  Laterality: Left;  . FRONTAL SINUS EXPLORATION Bilateral 08/04/2017   Procedure: BILATERAL FRONTAL RECESS SINUS EXPLORATION;  Surgeon: Newman Pies, MD;  Location: [facility];  Service: ENT;  Laterality: Bilateral;  . MAXILLARY ANTROSTOMY Bilateral 08/04/2017   Procedure: BILATERAL MAXILLARY ANTROSTOMY WITH TISSUE REMOVAL;  Surgeon: Newman Pies, MD;  Location:  [facility];  Service: ENT;  Laterality: Bilateral;  . NASAL SEPTOPLASTY W/ TURBINOPLASTY N/A 08/04/2017   Procedure: NASAL SEPTOPLASTY WITH TURBINATE REDUCTION;  Surgeon: Newman Pies, MD;  Location: [facility];  Service: ENT;  Laterality: N/A;  . SINUS ENDO WITH FUSION Bilateral 08/04/2017   Procedure: SINUS ENDO WITH FUSION NAVIGATION;  Surgeon: Newman Pies, MD;  Location: [facility];  Service: ENT;  Laterality:  Bilateral;  . SPHENOIDECTOMY Bilateral 08/04/2017   Procedure: BILATERAL SPHENOIDECTOMY WITH TISSUE REMOVAL;  Surgeon: Newman Pies, MD;  Location: [facility];  Service: ENT;  Laterality: Bilateral;     Home Medications:  Prior to Admission medications   Medication Sig Start Date End Date Taking? Authorizing Provider  busPIRone (BUSPAR) 5 MG tablet Take 10 mg by mouth 2 (two) times daily.    [provider]  cetirizine (ZYRTEC) 10 MG tablet Take 10 mg by mouth daily.    [provider]  DEXILANT 60 MG capsule TAKE 1 CAPSULE BY MOUTH  DAILY 05/30/16   Doreene Nest, NP  diclofenac sodium (VOLTAREN) 1 % GEL Apply 2-4 g topically daily as needed (for knee pain).  06/27/16   [provider]  meclizine (ANTIVERT) 25 MG tablet Take 1 tablet (25 mg total) by mouth 3 (three) times daily as needed for dizziness. 01/14/17   Vanetta Mulders, MD  metoprolol succinate (TOPROL XL) 50 MG 24 hr tablet Take 1 tablet (50 mg total) by mouth daily. Take with or immediately following a meal. 04/27/17   Lennette Bihari, MD  ondansetron (ZOFRAN ODT) 4 MG disintegrating tablet Take 1 tablet (4 mg total) by mouth every 8 (eight) hours as needed for nausea or vomiting. 01/14/17   Vanetta Mulders, MD  oxyCODONE-acetaminophen (PERCOCET/ROXICET) 5-325 MG tablet Take 1 tablet by mouth every 4 (four) hours as needed for severe pain. 08/04/17   Newman Pies, MD  promethazine (PHENERGAN) 25 MG tablet Take 1 tablet (25 mg total) by mouth every 6 (six) hours as needed for nausea or vomiting. 01/14/17   Vanetta Mulders, MD  simvastatin (ZOCOR) 20 MG tablet Take 20 mg by mouth every evening.     [provider]  topiramate (TOPAMAX) 25 MG tablet Take 25 mg by mouth daily as needed (at onset of headaches).     [provider]  UNABLE TO FIND CPAP: At bedtime    [provider]    Inpatient Medications: Scheduled Meds: . aspirin  324 mg Oral Once   Continuous  Infusions:  PRN Meds:   Allergies:    Allergies  Allergen Reactions  . Paxil [Paroxetine] Other (See Comments)    Induced panic    Social History:   Social History   Socioeconomic History  . Marital status: Married    Spouse name: Not on file  . Number of children: Not on file  . Years of education: Not on file  . Highest education level: Not on file  Occupational History  . 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 Financial resource strain: Not on file  . Food insecurity:    Worry: Not on file    Inability: Not on file  . Transportation needs:    Medical: Not on file    Non-medical: Not on file  Tobacco Use  . Smoking status: Never Smoker  . Smokeless tobacco: Current User    Types: Chew  . Tobacco comment: he does need a patch  Substance and  Sexual Activity  . Alcohol use: Yes    Comment: social  . Drug use: No  . Sexual activity: Not on file  Lifestyle  . Physical activity:    Days per week: Not on file    Minutes per session: Not on file  . Stress: Not on file  Relationships  . Social connections:    Talks on phone: Not on file    Gets together: Not on file    Attends religious service: Not on file    Active member of club or organization: Not on file    Attends meetings of clubs or organizations: Not on file    Relationship status: Not on file  . Intimate partner violence:    Fear of current or ex partner: Not on file    Emotionally abused: Not on file    Physically abused: Not on file    Forced sexual activity: Not on file  Other Topics Concern  . Not on file  Social History Narrative   Married.   6 children, 1 grandchild.   Works as a Art therapist   Enjoys spending time with family, video games.     Family History:    Family History  Problem Relation Age of Onset  . Arthritis Mother   . Hyperlipidemia Mother   . Hypertension Mother   . Stroke Mother   . Diabetes Mother   . Stomach cancer Mother   . Hyperlipidemia Father    . Heart disease Father 80  . Hypertension Father      ROS:  Please see the history of present illness.   All other ROS reviewed and negative.     Physical Exam/Data:   Vitals:   10/15/17 1345 10/15/17 1400 10/15/17 1415 10/15/17 1430  BP: 135/89 (!) 132/92 (!) 127/99 (!) 134/99  Pulse: 86 88 74 76  Resp: 14 20 13 13   Temp:      TempSrc:      SpO2: 99% 99% 100% 100%   No intake or output data in the 24 hours ending 10/15/17 1458 There were no vitals filed for this visit. There is no height or weight on file to calculate BMI.  General:  Well nourished, well developed, in no acute distress HEENT: normal Lymph: no adenopathy Neck: no JVD Endocrine:  No thryomegaly Vascular: No carotid bruits; FA pulses 2+ bilaterally without bruits  Cardiac:  normal S1, S2; RRR; no murmur  Lungs:  clear to auscultation bilaterally, no wheezing, rhonchi or rales  Abd: soft, nontender, no hepatomegaly  Ext: no edema Musculoskeletal:  No deformities, BUE and BLE strength normal and equal Skin: warm and dry  Neuro:  CNs 2-12 intact, no focal abnormalities noted Psych:  Normal affect   EKG:  The EKG was personally reviewed and demonstrates:  NSR w/ nonspecific TW abnormalties, flat in V4-V6 Telemetry:  Telemetry was personally reviewed and demonstrates:  NSR  Relevant CV Studies: 2D Echo 03/2017  Study Conclusions  - Left ventricle: The cavity size was normal. Wall thickness was   increased in a pattern of mild LVH. Systolic function was normal.   The estimated ejection fraction was in the range of 60% to 65%.   Wall motion was normal; there were no regional wall motion   abnormalities. Left ventricular diastolic function parameters   were normal. - Atrial septum: No defect or patent foramen ovale was identified.  Laboratory Data:  Chemistry Recent Labs  Lab 10/15/17 1147  NA 139  K 3.6  CL 102  CO2 25  GLUCOSE 118*  BUN 7  CREATININE 1.16  CALCIUM 10.3  GFRNONAA >60   GFRAA >60  ANIONGAP 12    No results for input(s): PROT, ALBUMIN, AST, ALT, ALKPHOS, BILITOT in the last 168 hours. Hematology Recent Labs  Lab 10/15/17 1147  WBC 9.3  RBC 5.82*  HGB 15.9  HCT 46.9  MCV 80.6  MCH 27.3  MCHC 33.9  RDW 12.6  PLT 360   Cardiac EnzymesNo results for input(s): TROPONINI in the last 168 hours.  Recent Labs  Lab 10/15/17 1204  TROPIPOC 0.01    BNPNo results for input(s): BNP, PROBNP in the last 168 hours.  DDimer  Recent Labs  Lab 10/15/17 1211  DDIMER 0.35    Radiology/Studies:  Dg Chest 2 View  Result Date: 10/15/2017 CLINICAL DATA:  Chest pain EXAM: CHEST - 2 VIEW COMPARISON:  03/03/2017 chest radiograph. FINDINGS: Stable cardiomediastinal silhouette with normal heart size. No pneumothorax. No pleural effusion. Lungs appear clear, with no acute consolidative airspace disease and no pulmonary edema. IMPRESSION: No active cardiopulmonary disease. Electronically Signed   By: Delbert PhenixJason A Poff M.D.   On: 10/15/2017 12:32    Assessment and Plan:   Jetta LoutDaron Burton is a 47 y.o. male with a hx of HTN, HLD, family h/o premature CAD (father w/ MI in mid 5850s) palpitations, GERD, OSA noncompliant with CPAP, migraine HAs and anxiety, who is being seen today for the evaluation of chest pain at the request of Dr. Ophelia CharterYates, Internal Medicine.  1. Exertional Chest Pain, dyspnea and fatigue: symptoms are concerning for cardiac etiology. D-dimer is negative, thus PE ruled out. Hgb is WNL at 15.9. CXR negative. He has several cardiac risk factors including family h/o premature CAD (father with MI in his mid 5450s) as well as HTN and HLD. He had a stress test several years ago in [place] and was told that it was normal. Echo 03/2017 showed normal LVEF and wall motion. He is currently CP free at rest. Initial troponin is negative. Continue to cycle enzymes x 3 for r/o. NPO at midnight. Will discuss w/ MD possible cath vs coronary CTA with calcium scoring. Check fasting lipids in the  AM to see if cholesterol is controlled.    For questions or updates, please contact [facility] Please consult [email or web address] for contact info under Cardiology/STEMI.   Signed, Robbie LisBrittainy Simmons, PA-C  10/15/2017 2:58 PM   Attending Note:   The patient was seen and examined.  Agree with assessment and plan as noted above.  Changes made to the above note as needed.  Patient seen and independently examined with Boyce MediciBrittany Simmons, PA .   We discussed all aspects of the encounter. I agree with the assessment and plan as stated above.  Chest tightness: Patient presents with exertional chest pain, dyspnea and fatigue.  Not all of his symptoms are consistent with coronary artery disease but he does have several symptoms that are concerning.  He is been having exertional chest tightness and shortness of breath that seems to be progressive over the past couple of months.  He and his wife used to walk several miles a day 3 or 4 times a week but now he cannot walk at all because of severe shortness of breath and this chest tightness.  He has a strong family history of premature coronary artery disease.  He also has a history of hypertension and hyperlipidemia.  I would recommend that we proceed with heart catheterization tomorrow.  We  discussed the risks, benefits, options.  He understands and agrees to proceed.  2.  Fatigue: The patient complains of severe fatigue.  He admits that he does not sleep well.  He has obstructive sleep apnea but does not tolerate his CPAP mask.   He also complains of palpitations, worsening indigestion.  A lot of the symptoms could be attributed to not sleeping well.  He will need to return to his sleep apnea doctor and be fitted for a different mask or continue to try to adjust to the CPAP  mask that he has.   I have spent a total of 40 minutes with patient reviewing hospital  notes , telemetry, EKGs, labs and examining patient as well as establishing an assessment and plan  that was discussed with the patient. > 50% of time was spent in direct patient care.    Vesta Mixer, Montez Hageman., MD, [facility] 10/15/2017, 5:58 PM [address]  [address] Office [phone] Pager [phone]

## 2017-10-15 NOTE — Consult Note (Addendum)
Cardiology Consultation:   Patient ID: Ronald Burton; [id number]; 1970-06-03   Admit date: 10/15/2017 Date of Consult: 10/15/2017  Primary Care Provider: Doreene Nest, NP Primary Cardiologist: Nicki Guadalajara, MD    Patient Profile:   Ronald Burton is a 47 y.o. male with a hx of HTN, HLD, family h/o premature CAD, palpitations, GERD, OSA noncompliant with CPAP, migraine HAs and anxiety, who is being seen today for the evaluation of chest pain at the request of Dr. Ophelia Charter, Internal Medicine.  History of Present Illness:   Ronald Burton is a former resident of [place]. He had a stress test while living there several years ago and was told it was normal. Cardiac risk factors include HTN, HLD and family h/o premature CAD (father had MI in his mid 31s). Also has GERD and takes Dexilant. Has OSA but noncompliant with CPAP. Cannot tolerate the device. Uncomfortable for patient. H/o migraine headaches and takes topiramate. He was referred to Dr. Tresa Endo in 02/2017 for palpitations and atypical chest pain. His free T4 was 1.23, TSH 1.04.  Magnesium was minimally increased at 2.4. Echo showed normal LVEF and normal diastolic function. Dr. Tresa Endo initiated beta blocker therapy and started metoprolol. Pt was seen back for revaluation several weeks later in 04/2017 and noted significant improvement with resolution of palpitations and CP. No further w/u pursued. He was instructed to return back in 1 year or sooner if needed.  Pt now presents to the [facility] ED with complaint of chest pain and exertional dyspnea. Symptoms have been present for several weeks but progressively worsening. He notes decreased exercise tolerance. With any type of physical activity, he gives out. Always tired. Gets SOB with activity and has exertional CP, described as chest pressure. Symptoms get better with rest. He traveled to [place] about 1 month ago, but by plane. Short flight. No LEE or leg pain. He notes h/o GERD but has been compliant with  Dexilant. He has also recently added Zantac and has been using Pepto Bismuth over the last few weeks w/o any improvement in symptoms.   POC troponin is negative x 1. D-dimer is negative. CBC and BMP unremarkable. Hgb is 15.  CXR negative. VSS. CP free at rest.    Past Medical History:  Diagnosis Date  . Anxiety   . Arthritis    knees  . BPH (benign prostatic hyperplasia)   . Depression   . Deviated septum   . GERD (gastroesophageal reflux disease)   . Headache   . Hyperlipidemia   . Hypertension   . Irregular heart beat   . Migraines   . OSA (obstructive sleep apnea)    does not use CPAP  . Palpitations   . Seasonal allergies   . Tinnitus   . Urine incontinence     Past Surgical History:  Procedure Laterality Date  . ETHMOIDECTOMY Bilateral 08/04/2017   Procedure: BILATERAL ETHMOIDECTOMY WITH TISSUE REMOVAL;  Surgeon: Newman Pies, MD;  Location: [facility];  Service: ENT;  Laterality: Bilateral;  . EXCISION CHONCHA BULLOSA Left 08/04/2017   Procedure: LEFT CONCHA BULLOSA RESECTION;  Surgeon: Newman Pies, MD;  Location: [facility];  Service: ENT;  Laterality: Left;  . FRONTAL SINUS EXPLORATION Bilateral 08/04/2017   Procedure: BILATERAL FRONTAL RECESS SINUS EXPLORATION;  Surgeon: Newman Pies, MD;  Location: [facility];  Service: ENT;  Laterality: Bilateral;  . MAXILLARY ANTROSTOMY Bilateral 08/04/2017   Procedure: BILATERAL MAXILLARY ANTROSTOMY WITH TISSUE REMOVAL;  Surgeon: Newman Pies, MD;  Location:  [facility];  Service: ENT;  Laterality: Bilateral;  . NASAL SEPTOPLASTY W/ TURBINOPLASTY N/A 08/04/2017   Procedure: NASAL SEPTOPLASTY WITH TURBINATE REDUCTION;  Surgeon: Newman Pies, MD;  Location: [facility];  Service: ENT;  Laterality: N/A;  . SINUS ENDO WITH FUSION Bilateral 08/04/2017   Procedure: SINUS ENDO WITH FUSION NAVIGATION;  Surgeon: Newman Pies, MD;  Location: [facility];  Service: ENT;  Laterality:  Bilateral;  . SPHENOIDECTOMY Bilateral 08/04/2017   Procedure: BILATERAL SPHENOIDECTOMY WITH TISSUE REMOVAL;  Surgeon: Newman Pies, MD;  Location: [facility];  Service: ENT;  Laterality: Bilateral;     Home Medications:  Prior to Admission medications   Medication Sig Start Date End Date Taking? Authorizing Provider  busPIRone (BUSPAR) 5 MG tablet Take 10 mg by mouth 2 (two) times daily.    [provider]  cetirizine (ZYRTEC) 10 MG tablet Take 10 mg by mouth daily.    [provider]  DEXILANT 60 MG capsule TAKE 1 CAPSULE BY MOUTH  DAILY 05/30/16   Doreene Nest, NP  diclofenac sodium (VOLTAREN) 1 % GEL Apply 2-4 g topically daily as needed (for knee pain).  06/27/16   [provider]  meclizine (ANTIVERT) 25 MG tablet Take 1 tablet (25 mg total) by mouth 3 (three) times daily as needed for dizziness. 01/14/17   Vanetta Mulders, MD  metoprolol succinate (TOPROL XL) 50 MG 24 hr tablet Take 1 tablet (50 mg total) by mouth daily. Take with or immediately following a meal. 04/27/17   Lennette Bihari, MD  ondansetron (ZOFRAN ODT) 4 MG disintegrating tablet Take 1 tablet (4 mg total) by mouth every 8 (eight) hours as needed for nausea or vomiting. 01/14/17   Vanetta Mulders, MD  oxyCODONE-acetaminophen (PERCOCET/ROXICET) 5-325 MG tablet Take 1 tablet by mouth every 4 (four) hours as needed for severe pain. 08/04/17   Newman Pies, MD  promethazine (PHENERGAN) 25 MG tablet Take 1 tablet (25 mg total) by mouth every 6 (six) hours as needed for nausea or vomiting. 01/14/17   Vanetta Mulders, MD  simvastatin (ZOCOR) 20 MG tablet Take 20 mg by mouth every evening.     [provider]  topiramate (TOPAMAX) 25 MG tablet Take 25 mg by mouth daily as needed (at onset of headaches).     [provider]  UNABLE TO FIND CPAP: At bedtime    [provider]    Inpatient Medications: Scheduled Meds: . aspirin  324 mg Oral Once   Continuous  Infusions:  PRN Meds:   Allergies:    Allergies  Allergen Reactions  . Paxil [Paroxetine] Other (See Comments)    Induced panic    Social History:   Social History   Socioeconomic History  . Marital status: Married    Spouse name: Not on file  . Number of children: Not on file  . Years of education: Not on file  . Highest education level: Not on file  Occupational History  . 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 Financial resource strain: Not on file  . Food insecurity:    Worry: Not on file    Inability: Not on file  . Transportation needs:    Medical: Not on file    Non-medical: Not on file  Tobacco Use  . Smoking status: Never Smoker  . Smokeless tobacco: Current User    Types: Chew  . Tobacco comment: he does need a patch  Substance and  Sexual Activity  . Alcohol use: Yes    Comment: social  . Drug use: No  . Sexual activity: Not on file  Lifestyle  . Physical activity:    Days per week: Not on file    Minutes per session: Not on file  . Stress: Not on file  Relationships  . Social connections:    Talks on phone: Not on file    Gets together: Not on file    Attends religious service: Not on file    Active member of club or organization: Not on file    Attends meetings of clubs or organizations: Not on file    Relationship status: Not on file  . Intimate partner violence:    Fear of current or ex partner: Not on file    Emotionally abused: Not on file    Physically abused: Not on file    Forced sexual activity: Not on file  Other Topics Concern  . Not on file  Social History Narrative   Married.   6 children, 1 grandchild.   Works as a Art therapist   Enjoys spending time with family, video games.     Family History:    Family History  Problem Relation Age of Onset  . Arthritis Mother   . Hyperlipidemia Mother   . Hypertension Mother   . Stroke Mother   . Diabetes Mother   . Stomach cancer Mother   . Hyperlipidemia Father    . Heart disease Father 80  . Hypertension Father      ROS:  Please see the history of present illness.   All other ROS reviewed and negative.     Physical Exam/Data:   Vitals:   10/15/17 1345 10/15/17 1400 10/15/17 1415 10/15/17 1430  BP: 135/89 (!) 132/92 (!) 127/99 (!) 134/99  Pulse: 86 88 74 76  Resp: 14 20 13 13   Temp:      TempSrc:      SpO2: 99% 99% 100% 100%   No intake or output data in the 24 hours ending 10/15/17 1458 There were no vitals filed for this visit. There is no height or weight on file to calculate BMI.  General:  Well nourished, well developed, in no acute distress HEENT: normal Lymph: no adenopathy Neck: no JVD Endocrine:  No thryomegaly Vascular: No carotid bruits; FA pulses 2+ bilaterally without bruits  Cardiac:  normal S1, S2; RRR; no murmur  Lungs:  clear to auscultation bilaterally, no wheezing, rhonchi or rales  Abd: soft, nontender, no hepatomegaly  Ext: no edema Musculoskeletal:  No deformities, BUE and BLE strength normal and equal Skin: warm and dry  Neuro:  CNs 2-12 intact, no focal abnormalities noted Psych:  Normal affect   EKG:  The EKG was personally reviewed and demonstrates:  NSR w/ nonspecific TW abnormalties, flat in V4-V6 Telemetry:  Telemetry was personally reviewed and demonstrates:  NSR  Relevant CV Studies: 2D Echo 03/2017  Study Conclusions  - Left ventricle: The cavity size was normal. Wall thickness was   increased in a pattern of mild LVH. Systolic function was normal.   The estimated ejection fraction was in the range of 60% to 65%.   Wall motion was normal; there were no regional wall motion   abnormalities. Left ventricular diastolic function parameters   were normal. - Atrial septum: No defect or patent foramen ovale was identified.  Laboratory Data:  Chemistry Recent Labs  Lab 10/15/17 1147  NA 139  K 3.6  CL 102  CO2 25  GLUCOSE 118*  BUN 7  CREATININE 1.16  CALCIUM 10.3  GFRNONAA >60   GFRAA >60  ANIONGAP 12    No results for input(s): PROT, ALBUMIN, AST, ALT, ALKPHOS, BILITOT in the last 168 hours. Hematology Recent Labs  Lab 10/15/17 1147  WBC 9.3  RBC 5.82*  HGB 15.9  HCT 46.9  MCV 80.6  MCH 27.3  MCHC 33.9  RDW 12.6  PLT 360   Cardiac EnzymesNo results for input(s): TROPONINI in the last 168 hours.  Recent Labs  Lab 10/15/17 1204  TROPIPOC 0.01    BNPNo results for input(s): BNP, PROBNP in the last 168 hours.  DDimer  Recent Labs  Lab 10/15/17 1211  DDIMER 0.35    Radiology/Studies:  Dg Chest 2 View  Result Date: 10/15/2017 CLINICAL DATA:  Chest pain EXAM: CHEST - 2 VIEW COMPARISON:  03/03/2017 chest radiograph. FINDINGS: Stable cardiomediastinal silhouette with normal heart size. No pneumothorax. No pleural effusion. Lungs appear clear, with no acute consolidative airspace disease and no pulmonary edema. IMPRESSION: No active cardiopulmonary disease. Electronically Signed   By: Delbert PhenixJason A Poff M.D.   On: 10/15/2017 12:32    Assessment and Plan:   Ronald Burton is a 47 y.o. male with a hx of HTN, HLD, family h/o premature CAD (father w/ MI in mid 5850s) palpitations, GERD, OSA noncompliant with CPAP, migraine HAs and anxiety, who is being seen today for the evaluation of chest pain at the request of Dr. Ophelia CharterYates, Internal Medicine.  1. Exertional Chest Pain, dyspnea and fatigue: symptoms are concerning for cardiac etiology. D-dimer is negative, thus PE ruled out. Hgb is WNL at 15.9. CXR negative. He has several cardiac risk factors including family h/o premature CAD (father with MI in his mid 5450s) as well as HTN and HLD. He had a stress test several years ago in [place] and was told that it was normal. Echo 03/2017 showed normal LVEF and wall motion. He is currently CP free at rest. Initial troponin is negative. Continue to cycle enzymes x 3 for r/o. NPO at midnight. Will discuss w/ MD possible cath vs coronary CTA with calcium scoring. Check fasting lipids in the  AM to see if cholesterol is controlled.    For questions or updates, please contact [facility] Please consult [email or web address] for contact info under Cardiology/STEMI.   Signed, Robbie LisBrittainy Simmons, PA-C  10/15/2017 2:58 PM   Attending Note:   The patient was seen and examined.  Agree with assessment and plan as noted above.  Changes made to the above note as needed.  Patient seen and independently examined with Boyce MediciBrittany Simmons, PA .   We discussed all aspects of the encounter. I agree with the assessment and plan as stated above.  Chest tightness: Patient presents with exertional chest pain, dyspnea and fatigue.  Not all of his symptoms are consistent with coronary artery disease but he does have several symptoms that are concerning.  He is been having exertional chest tightness and shortness of breath that seems to be progressive over the past couple of months.  He and his wife used to walk several miles a day 3 or 4 times a week but now he cannot walk at all because of severe shortness of breath and this chest tightness.  He has a strong family history of premature coronary artery disease.  He also has a history of hypertension and hyperlipidemia.  I would recommend that we proceed with heart catheterization tomorrow.  We  discussed the risks, benefits, options.  He understands and agrees to proceed.  2.  Fatigue: The patient complains of severe fatigue.  He admits that he does not sleep well.  He has obstructive sleep apnea but does not tolerate his CPAP mask.   He also complains of palpitations, worsening indigestion.  A lot of the symptoms could be attributed to not sleeping well.  He will need to return to his sleep apnea doctor and be fitted for a different mask or continue to try to adjust to the CPAP  mask that he has.   I have spent a total of 40 minutes with patient reviewing hospital  notes , telemetry, EKGs, labs and examining patient as well as establishing an assessment and plan  that was discussed with the patient. > 50% of time was spent in direct patient care.    Vesta Mixer, Montez Hageman., MD, [facility] 10/15/2017, 5:58 PM [address]  [address] Office [phone] Pager [phone]

## 2017-10-15 NOTE — ED Notes (Signed)
Patient transported to X-ray 

## 2017-10-16 ENCOUNTER — Ambulatory Visit (HOSPITAL_COMMUNITY)
Admission: EM | Disposition: A | Discharge status: Home / Self Care | Payer: Self-pay | Source: Home / Self Care | Attending: Emergency Medicine

## 2017-10-16 ENCOUNTER — Encounter (HOSPITAL_COMMUNITY): Payer: Self-pay | Admitting: Cardiology

## 2017-10-16 DIAGNOSIS — Z8249 Family history of ischemic heart disease and other diseases of the circulatory system: Secondary | ICD-10-CM | POA: Diagnosis not present

## 2017-10-16 DIAGNOSIS — K219 Gastro-esophageal reflux disease without esophagitis: Secondary | ICD-10-CM

## 2017-10-16 DIAGNOSIS — R079 Chest pain, unspecified: Secondary | ICD-10-CM

## 2017-10-16 DIAGNOSIS — E785 Hyperlipidemia, unspecified: Secondary | ICD-10-CM | POA: Diagnosis not present

## 2017-10-16 DIAGNOSIS — I10 Essential (primary) hypertension: Secondary | ICD-10-CM

## 2017-10-16 DIAGNOSIS — I259 Chronic ischemic heart disease, unspecified: Secondary | ICD-10-CM | POA: Diagnosis not present

## 2017-10-16 DIAGNOSIS — I200 Unstable angina: Secondary | ICD-10-CM

## 2017-10-16 DIAGNOSIS — G4733 Obstructive sleep apnea (adult) (pediatric): Secondary | ICD-10-CM | POA: Diagnosis not present

## 2017-10-16 HISTORY — PX: LEFT HEART CATH AND CORONARY ANGIOGRAPHY: CATH118249

## 2017-10-16 LAB — CBC
HEMATOCRIT: 42.8 % (ref 39.0–52.0)
HEMOGLOBIN: 14.2 g/dL (ref 13.0–17.0)
MCH: 27.4 pg (ref 26.0–34.0)
MCHC: 33.2 g/dL (ref 30.0–36.0)
MCV: 82.5 fL (ref 78.0–100.0)
Platelets: 331 10*3/uL (ref 150–400)
RBC: 5.19 MIL/uL (ref 4.22–5.81)
RDW: 12.5 % (ref 11.5–15.5)
WBC: 11 10*3/uL — ABNORMAL HIGH (ref 4.0–10.5)

## 2017-10-16 LAB — BASIC METABOLIC PANEL
Anion gap: 8 (ref 5–15)
BUN: 9 mg/dL (ref 6–20)
CO2: 24 mmol/L (ref 22–32)
CREATININE: 1.11 mg/dL (ref 0.61–1.24)
Calcium: 9.3 mg/dL (ref 8.9–10.3)
Chloride: 106 mmol/L (ref 101–111)
GFR calc non Af Amer: >60 mL/min (ref >60)
Glucose, Bld: 100 mg/dL — ABNORMAL HIGH (ref 65–99)
Potassium: 4.2 mmol/L (ref 3.5–5.1)
SODIUM: 138 mmol/L (ref 135–145)

## 2017-10-16 LAB — LIPID PANEL
Cholesterol: 131 mg/dL (ref 0–200)
HDL: 31 mg/dL — ABNORMAL LOW (ref >40)
LDL CALC: 82 mg/dL (ref 0–99)
TRIGLYCERIDES: 90 mg/dL (ref <150)
Total CHOL/HDL Ratio: 4.2 RATIO
VLDL: 18 mg/dL (ref 0–40)

## 2017-10-16 LAB — HIV ANTIBODY (ROUTINE TESTING W REFLEX): HIV SCREEN 4TH GENERATION: NONREACTIVE

## 2017-10-16 LAB — PROTIME-INR
INR: 1
PROTHROMBIN TIME: 13.1 s (ref 11.4–15.2)

## 2017-10-16 LAB — TROPONIN I

## 2017-10-16 SURGERY — LEFT HEART CATH AND CORONARY ANGIOGRAPHY
Anesthesia: LOCAL

## 2017-10-16 MED ORDER — SODIUM CHLORIDE 0.9% FLUSH: 3.0000 mL | INTRAVENOUS | Status: DC | PRN

## 2017-10-16 MED ORDER — MIDAZOLAM HCL 2 MG/2ML IJ SOLN
INTRAMUSCULAR | Status: DC | PRN
  Administered 2017-10-16: 1 mg via INTRAVENOUS

## 2017-10-16 MED ORDER — SODIUM CHLORIDE 0.9% FLUSH: 3.0000 mL | Freq: Two times a day (BID) | INTRAVENOUS | Status: DC

## 2017-10-16 MED ORDER — HEPARIN (PORCINE) IN NACL 1000-0.9 UT/500ML-% IV SOLN
INTRAVENOUS | Status: AC
  Filled 2017-10-16: qty 1000

## 2017-10-16 MED ORDER — VERAPAMIL HCL 2.5 MG/ML IV SOLN
INTRAVENOUS | Status: AC
  Filled 2017-10-16: qty 2

## 2017-10-16 MED ORDER — ASPIRIN 81 MG PO CHEW
81.0000 mg | CHEWABLE_TABLET | ORAL | Status: AC
  Administered 2017-10-16: 81 mg via ORAL
  Filled 2017-10-16: qty 1

## 2017-10-16 MED ORDER — SODIUM CHLORIDE 0.9 % IV SOLN: 250.0000 mL | INTRAVENOUS | Status: DC | PRN

## 2017-10-16 MED ORDER — IOHEXOL 350 MG/ML SOLN
INTRAVENOUS | Status: DC | PRN
  Administered 2017-10-16: 65 mL via INTRACARDIAC

## 2017-10-16 MED ORDER — HEPARIN SODIUM (PORCINE) 1000 UNIT/ML IJ SOLN
INTRAMUSCULAR | Status: DC | PRN
  Administered 2017-10-16: 5000 [IU] via INTRAVENOUS

## 2017-10-16 MED ORDER — ASPIRIN 81 MG PO CHEW: 81.0000 mg | CHEWABLE_TABLET | ORAL | Status: DC

## 2017-10-16 MED ORDER — HEPARIN (PORCINE) IN NACL 2-0.9 UNITS/ML
INTRAMUSCULAR | Status: AC | PRN
  Administered 2017-10-16 (×2): 500 mL

## 2017-10-16 MED ORDER — SODIUM CHLORIDE 0.9 % WEIGHT BASED INFUSION
3.0000 mL/kg/h | INTRAVENOUS | Status: DC
  Administered 2017-10-16: 3 mL/kg/h via INTRAVENOUS

## 2017-10-16 MED ORDER — FENTANYL CITRATE (PF) 100 MCG/2ML IJ SOLN
INTRAMUSCULAR | Status: DC | PRN
  Administered 2017-10-16: 25 ug via INTRAVENOUS

## 2017-10-16 MED ORDER — LIDOCAINE HCL (PF) 1 % IJ SOLN
INTRAMUSCULAR | Status: DC | PRN
  Administered 2017-10-16: 2 mL

## 2017-10-16 MED ORDER — HEPARIN (PORCINE) IN NACL 2-0.9 UNITS/ML
INTRAMUSCULAR | Status: DC | PRN
  Administered 2017-10-16: 10:00:00 via INTRA_ARTERIAL

## 2017-10-16 MED ORDER — MIDAZOLAM HCL 2 MG/2ML IJ SOLN
INTRAMUSCULAR | Status: AC
  Filled 2017-10-16: qty 2

## 2017-10-16 MED ORDER — SODIUM CHLORIDE 0.9 % WEIGHT BASED INFUSION: 1.0000 mL/kg/h | INTRAVENOUS | Status: DC

## 2017-10-16 MED ORDER — HEPARIN SODIUM (PORCINE) 1000 UNIT/ML IJ SOLN
INTRAMUSCULAR | Status: AC
  Filled 2017-10-16: qty 1

## 2017-10-16 MED ORDER — SODIUM CHLORIDE 0.9 % WEIGHT BASED INFUSION: 3.0000 mL/kg/h | INTRAVENOUS | Status: DC

## 2017-10-16 MED ORDER — LIDOCAINE HCL (PF) 1 % IJ SOLN
INTRAMUSCULAR | Status: AC
  Filled 2017-10-16: qty 30

## 2017-10-16 MED ORDER — SODIUM CHLORIDE 0.9 % IV SOLN
INTRAVENOUS | Status: AC
  Administered 2017-10-16: 10:00:00 via INTRAVENOUS

## 2017-10-16 MED ORDER — FENTANYL CITRATE (PF) 100 MCG/2ML IJ SOLN
INTRAMUSCULAR | Status: AC
  Filled 2017-10-16: qty 2

## 2017-10-16 SURGICAL SUPPLY — 9 items
CATH OPTITORQUE TIG 4.0 5F (CATHETERS) ×2 IMPLANT
DEVICE RAD COMP TR BAND LRG (VASCULAR PRODUCTS) ×2 IMPLANT
GLIDESHEATH SLEND A-KIT 6F 22G (SHEATH) ×2 IMPLANT
GUIDEWIRE INQWIRE 1.5J.035X260 (WIRE) ×1 IMPLANT
INQWIRE 1.5J .035X260CM (WIRE) ×2
KIT HEART LEFT (KITS) ×2 IMPLANT
PACK CARDIAC CATHETERIZATION (CUSTOM PROCEDURE TRAY) ×2 IMPLANT
TRANSDUCER W/STOPCOCK (MISCELLANEOUS) ×2 IMPLANT
TUBING CIL FLEX 10 FLL-RA (TUBING) ×2 IMPLANT

## 2017-10-16 NOTE — Progress Notes (Signed)
TR band removed from R radial site 30 mins post total deflation.  Guaze dressing placed per protocol.  Will con't to monitor for 1 hr, then ambulate Pt prior to DC

## 2017-10-16 NOTE — Discharge Summary (Signed)
Physician Discharge Summary   Patient ID: Ronald Burton MRN: [id number] DOB/AGE: 49-Aug-1972 49 y.o.  Admit date: 10/15/2017 Discharge date: 10/16/2017  Primary Care Physician:  Doreene Nest, NP   Recommendations for Outpatient Follow-up:  1. Follow up with PCP in 1-2 weeks  Home Health: None  Equipment/Devices: none   Discharge Condition: stable CODE STATUS: FULL  Diet recommendation: Heart healthy diet   Discharge Diagnoses:    . Atypical chest pain . Essential hypertension . GERD (gastroesophageal reflux disease) . Hyperlipidemia . OSA (obstructive sleep apnea) . Tobacco dependence   Consults: Cardiology    Allergies:   Allergies  Allergen Reactions  . Paxil [Paroxetine] Other (See Comments)    Induced panic     DISCHARGE MEDICATIONS: Allergies as of 10/16/2017      Reactions   Paxil [paroxetine] Other (See Comments)   Induced panic      Medication List    TAKE these medications   aspirin EC 81 MG tablet Take 81 mg by mouth daily.   bismuth subsalicylate 262 MG/15ML suspension Commonly known as:  PEPTO BISMOL Take 30 mLs by mouth every 6 (six) hours as needed for indigestion.   busPIRone 5 MG tablet Commonly known as:  BUSPAR Take 15 mg by mouth 2 (two) times daily.   cetirizine 10 MG tablet Commonly known as:  ZYRTEC Take 10 mg by mouth daily.   DEXILANT 60 MG capsule Generic drug:  dexlansoprazole TAKE 1 CAPSULE BY MOUTH  DAILY   diclofenac sodium 1 % Gel Commonly known as:  VOLTAREN Apply 2-4 g topically daily as needed (for knee pain).   meclizine 25 MG tablet Commonly known as:  ANTIVERT Take 1 tablet (25 mg total) by mouth 3 (three) times daily as needed for dizziness.   metoprolol succinate 50 MG 24 hr tablet Commonly known as:  TOPROL XL Take 1 tablet (50 mg total) by mouth daily. Take with or immediately following a meal.   ondansetron 4 MG disintegrating tablet Commonly known as:  ZOFRAN ODT Take 1 tablet (4 mg  total) by mouth every 8 (eight) hours as needed for nausea or vomiting.   promethazine 25 MG tablet Commonly known as:  PHENERGAN Take 1 tablet (25 mg total) by mouth every 6 (six) hours as needed for nausea or vomiting.   simvastatin 20 MG tablet Commonly known as:  ZOCOR Take 20 mg by mouth every evening.   topiramate 25 MG tablet Commonly known as:  TOPAMAX Take 25 mg by mouth daily as needed (at onset of headaches).   UNABLE TO FIND CPAP: At bedtime        Brief H and P: For complete details please refer to admission H and P, but in brief patient is a 47 year old male with history of OSA not on CPAP, hypertension, GERD, hyperlipidemia presented with chest pain.  Patient reported palpitations, nausea, symptoms off and on for years, worse in the last few weeks.  Had sinus surgery in March and after the surgery was mowing grass and symptoms started getting worse.  Patient reported intermittent chest pain that is hard to differentiate from GERD, feeling bad in the last few days, tired, difficulty sleeping, heart fluttering.  On the day of admission felt dizzy, lightheaded and fatigued.  Patient was admitted for further work-up  Hospital Course:  Apical chest pain with multiple symptoms including fatigue, nausea, palpitations, generalized weakness, dizziness -Chest x-ray unremarkable, EKG did not show acute ischemia, serial troponins normal -Cardiology was consulted patient underwent,  the cath which showed normal coronary arteries, no aortic stenosis or mitral valve regurgitation.  Normal LV function, likely nonischemic chest pain. -He was cleared by cardiology to be discharged home -Continue PPI, outpatient follow-up with PCP recommended D-dimer 0.35  Hypertension -Currently stable, continue Toprol-XL  Hyperlipidemia -Continue Zocor  OSA -Patient has difficulty tolerating both full mask and nasal prongs, many of them symptoms could be from inadequate sleep resulting from  untreated CPAP Patient was counseled compliance-   Tobacco dependence -Counseled on smoking cessation   Day of Discharge S: No chest pain or shortness of breath, negative cardiac cath, eager to go home  BP (!) 129/92   Pulse 73   Temp 98.1 F (36.7 C) (Oral)   Resp 19   Ht 5\' 9"  (1.753 m)   Wt 83.1 kg (183 lb 4.8 oz)   SpO2 100%   BMI 27.07 kg/m   Physical Exam: General: Alert and awake oriented x3 not in any acute distress. HEENT: anicteric sclera, pupils reactive to light and accommodation CVS: S1-S2 clear no murmur rubs or gallops Chest: clear to auscultation bilaterally, no wheezing rales or rhonchi Abdomen: soft nontender, nondistended, normal bowel sounds Extremities: no cyanosis, clubbing or edema noted bilaterally Neuro: Cranial nerves II-XII intact, no focal neurological deficits   The results of significant diagnostics from this hospitalization (including imaging, microbiology, ancillary and laboratory) are listed below for reference.      Procedures/Studies:  Cardiac cath Conclusion     Angiographically normal coronary arteries  The left ventricular systolic function is normal. The left ventricular ejection fraction is 55-65% by visual estimate.  LV end diastolic pressure is mildly elevated.  There is no mitral valve regurgitation.  There is no aortic valve stenosis.    Angiographically normal coronary arteries  Normal LV function with mildly elevated LVEDP.  Likely nonischemic chest pain       Dg Chest 2 View  Result Date: 10/15/2017 CLINICAL DATA:  Chest pain EXAM: CHEST - 2 VIEW COMPARISON:  03/03/2017 chest radiograph. FINDINGS: Stable cardiomediastinal silhouette with normal heart size. No pneumothorax. No pleural effusion. Lungs appear clear, with no acute consolidative airspace disease and no pulmonary edema. IMPRESSION: No active cardiopulmonary disease. Electronically Signed   By: Delbert Phenix M.D.   On: 10/15/2017 12:32        LAB RESULTS: Basic Metabolic Panel: Recent Labs  Lab 10/15/17 1147 10/16/17 0308  NA 139 138  K 3.6 4.2  CL 102 106  CO2 25 24  GLUCOSE 118* 100*  BUN 7 9  CREATININE 1.16 1.11  CALCIUM 10.3 9.3   Liver Function Tests: No results for input(s): AST, ALT, ALKPHOS, BILITOT, PROT, ALBUMIN in the last 168 hours. No results for input(s): LIPASE, AMYLASE in the last 168 hours. No results for input(s): AMMONIA in the last 168 hours. CBC: Recent Labs  Lab 10/15/17 1147 10/16/17 0308  WBC 9.3 11.0*  HGB 15.9 14.2  HCT 46.9 42.8  MCV 80.6 82.5  PLT 360 331   Cardiac Enzymes: Recent Labs  Lab 10/15/17 2110 10/16/17 0308  TROPONINI <0.03 <0.03   BNP: Invalid input(s): POCBNP CBG: Recent Labs  Lab 10/15/17 1159  GLUCAP 101*      Disposition and Follow-up: Discharge Instructions    Diet - low sodium heart healthy   Complete by:  As directed    Increase activity slowly   Complete by:  As directed        DISPOSITION: Home   DISCHARGE FOLLOW-UP Follow-up Information  Doreene Nestlark, Katherine K, NP. Schedule an appointment as soon as possible for a visit in 2 week(s).   Specialty:  Internal Medicine Contact information: [address] [place] [address] [phone]        Lennette BihariKelly, Thomas A, MD. Schedule an appointment as soon as possible for a visit in 2 week(s).   Specialty:  Cardiology Contact information: [address] [place] [address] [phone]            Time coordinating discharge:  35 minutes  Signed:   Thad Rangeripudeep Rad Gramling M.D. [facility] 10/16/2017, 1:46 PM Pager: [phone]

## 2017-10-16 NOTE — Interval H&P Note (Signed)
History and Physical Interval Note:  10/16/2017 9:09 AM  Ronald Burton  has presented today for surgery, with the diagnosis of cp- unstable angina.    The various methods of treatment have been discussed with the patient and family. After consideration of risks, benefits and other options for treatment, the patient has consented to  Procedure(s): LEFT HEART CATH AND CORONARY ANGIOGRAPHY (N/A) with possible PERCUTANEOUS CORONARY INTERVENTION   as a surgical intervention .  The patient's history has been reviewed, patient examined, no change in status, stable for surgery.  I have reviewed the patient's chart and labs.  Questions were answered to the patient's satisfaction.    Cath Lab Visit (complete for each Cath Lab visit)  Clinical Evaluation Leading to the Procedure:   ACS: Yes.    Non-ACS:    Anginal Classification: CCS III  Anti-ischemic medical therapy: Minimal Therapy (1 class of medications)  Non-Invasive Test Results: No non-invasive testing performed  Prior CABG: No previous CABG   Bryan Lemmaavid Imaan Padgett

## 2017-10-16 NOTE — Progress Notes (Addendum)
Progress Note  Patient Name: Ronald Burton Date of Encounter: 10/16/2017  Primary Cardiologist: Nicki Guadalajarahomas Ruperto Kiernan, MD   Subjective   No recurrent chest pain. Denies SOB.   Inpatient Medications    Scheduled Meds: . aspirin EC  81 mg Oral Daily  . busPIRone  10 mg Oral BID  . loratadine  10 mg Oral Daily  . metoprolol succinate  50 mg Oral Daily  . pantoprazole  40 mg Oral Daily  . simvastatin  20 mg Oral QPM  . sodium chloride flush  3 mL Intravenous Q12H   Continuous Infusions: . sodium chloride 75 mL/hr at 10/16/17 1023  . sodium chloride     PRN Meds: sodium chloride, acetaminophen, gi cocktail, morphine injection, ondansetron (ZOFRAN) IV, sodium chloride flush, topiramate   Vital Signs    Vitals:   10/16/17 0933 10/16/17 0938 10/16/17 0943 10/16/17 1008  BP: (!) 126/91 124/84 121/84 119/90  Pulse: 73 84 76 68  Resp: (!) 29 19 (!) 31 16  Temp:      TempSrc:      SpO2: 100% 100% 100% 98%  Weight:      Height:        Intake/Output Summary (Last 24 hours) at 10/16/2017 1052 Last data filed at 10/15/2017 2346 Gross per 24 hour  Intake 240 ml  Output -  Net 240 ml   Filed Weights   10/15/17 1510 10/16/17 0414  Weight: 182 lb 5.1 oz (82.7 kg) 183 lb 4.8 oz (83.1 kg)    Telemetry    SR - Personally Reviewed  ECG    NSR at rate of 70 bpm- Personally Reviewed  Physical Exam   GEN: No acute distress.   Neck: No JVD Cardiac: RRR, no murmurs, rubs, or gallops. TR band on R radial artery  Respiratory: Clear to auscultation bilaterally. GI: Soft, nontender, non-distended  MS: No edema; No deformity. Neuro:  Nonfocal  Psych: Normal affect   Labs    Chemistry Recent Labs  Lab 10/15/17 1147 10/16/17 0308  NA 139 138  K 3.6 4.2  CL 102 106  CO2 25 24  GLUCOSE 118* 100*  BUN 7 9  CREATININE 1.16 1.11  CALCIUM 10.3 9.3  GFRNONAA >60 >60  GFRAA >60 >60  ANIONGAP 12 8     Hematology Recent Labs  Lab 10/15/17 1147 10/16/17 0308  WBC 9.3 11.0*    RBC 5.82* 5.19  HGB 15.9 14.2  HCT 46.9 42.8  MCV 80.6 82.5  MCH 27.3 27.4  MCHC 33.9 33.2  RDW 12.6 12.5  PLT 360 331    Cardiac Enzymes Recent Labs  Lab 10/15/17 1515 10/15/17 2110 10/16/17 0308  TROPONINI <0.03 <0.03 <0.03    Recent Labs  Lab 10/15/17 1204  TROPIPOC 0.01   DDimer  Recent Labs  Lab 10/15/17 1211  DDIMER 0.35     Radiology    Dg Chest 2 View  Result Date: 10/15/2017 CLINICAL DATA:  Chest pain EXAM: CHEST - 2 VIEW COMPARISON:  03/03/2017 chest radiograph. FINDINGS: Stable cardiomediastinal silhouette with normal heart size. No pneumothorax. No pleural effusion. Lungs appear clear, with no acute consolidative airspace disease and no pulmonary edema. IMPRESSION: No active cardiopulmonary disease. Electronically Signed   By: Delbert PhenixJason A Poff M.D.   On: 10/15/2017 12:32    Cardiac Studies   LEFT HEART CATH AND CORONARY ANGIOGRAPHY  10/16/17  Conclusion     Angiographically normal coronary arteries  The left ventricular systolic function is normal. The left ventricular ejection  fraction is 55-65% by visual estimate.  LV end diastolic pressure is mildly elevated.  There is no mitral valve regurgitation.  There is no aortic valve stenosis.    Angiographically normal coronary arteries  Normal LV function with mildly elevated LVEDP.  Likely nonischemic chest pain   Plan: Return to nursing unit for ongoing care, TR band removal. Will be stable for discharge later this afternoon pending further evaluation by primary team.  Standard post-cath precautions  Diagnostic Diagram       Patient Profile     Ronald Burton is a 47 y.o. male with a hx of HTN, HLD, family h/o premature CAD, palpitations, GERD, OSA noncompliant with CPAP, migraine HAs and anxiety admitted for the evaluation of exertional fatigue, chest pain and dyspnea.   Assessment & Plan    1. Chest pain - Troponin negative. Cath showed normal coronaries. Normal LV function with  mildly elevated LVEDP. No recurrent chest pain. His chest pain likely non ischemic. Discharge later today once seen by Dr. Tresa Endo.   2. HTN - BP stable on current medications  3. HLD - 10/16/2017: Cholesterol 131; HDL 31; LDL Cholesterol 82; Triglycerides 90; VLDL 18  - Continue Zocor 20mg  qd.   4. Tobacco smoking - Encouraged cessation    For questions or updates, please contact [facility] Please consult [email or web address] for contact info under Cardiology/STEMI.      Lorelei Pont, PA  10/16/2017, 10:52 AM       Patient seen and examined. Agree with assessment and plan. Cath data reviewed; normal coronaries. Pt has OSA and had been noncompliant with CPAP.  Long discussion with him; untreated sleep apnea is undoubtedly contributing to his fatigue with non-restorative sleep, and reduction in deep and REM sleep and potentially contributing to nocturnal palpitation,  He will try to resume.  OK for DC today.   Lennette Bihari, MD, [facility] 10/16/2017 1:37 PM

## 2017-10-16 NOTE — Progress Notes (Signed)
At 1230 noted scant amt of fresh bright blood around edge of TR band, no air removed at this time.  No evidence of hematoma, or neurovascular complications.  Will con't to monitor closely and remove as able.

## 2017-10-16 NOTE — Progress Notes (Signed)
Pt ambulated whole unit.  Complains of fatigue and SOB, but states this has been going on for some time.  Return to bed for dinner.  Will DC after dinner.

## 2017-10-19 ENCOUNTER — Ambulatory Visit: Payer: 59 | Admitting: Primary Care

## 2017-10-19 VITALS — BP 130/82 | HR 71 | Temp 98.1°F | Ht 69.0 in | Wt 186.0 lb

## 2017-10-19 DIAGNOSIS — I10 Essential (primary) hypertension: Secondary | ICD-10-CM

## 2017-10-19 DIAGNOSIS — M25562 Pain in left knee: Secondary | ICD-10-CM | POA: Diagnosis not present

## 2017-10-19 DIAGNOSIS — G8929 Other chronic pain: Secondary | ICD-10-CM | POA: Diagnosis not present

## 2017-10-19 DIAGNOSIS — G4733 Obstructive sleep apnea (adult) (pediatric): Secondary | ICD-10-CM | POA: Diagnosis not present

## 2017-10-19 DIAGNOSIS — J309 Allergic rhinitis, unspecified: Secondary | ICD-10-CM | POA: Diagnosis not present

## 2017-10-19 DIAGNOSIS — Z09 Encounter for follow-up examination after completed treatment for conditions other than malignant neoplasm: Secondary | ICD-10-CM

## 2017-10-19 DIAGNOSIS — M25561 Pain in right knee: Secondary | ICD-10-CM | POA: Diagnosis not present

## 2017-10-19 DIAGNOSIS — F17200 Nicotine dependence, unspecified, uncomplicated: Secondary | ICD-10-CM

## 2017-10-19 DIAGNOSIS — K219 Gastro-esophageal reflux disease without esophagitis: Secondary | ICD-10-CM

## 2017-10-19 DIAGNOSIS — I259 Chronic ischemic heart disease, unspecified: Secondary | ICD-10-CM

## 2017-10-19 DIAGNOSIS — J329 Chronic sinusitis, unspecified: Secondary | ICD-10-CM

## 2017-10-19 MED ORDER — NICOTINE POLACRILEX 4 MG MT GUM: 4.0000 mg | CHEWING_GUM | OROMUCOSAL | 0 refills | Status: DC | PRN

## 2017-10-19 NOTE — Progress Notes (Signed)
Subjective:    Patient ID: Ronald Burton, male    DOB: Feb 26, 1971, 47 y.o.   MRN: [id number]  HPI  Mr. Ronald Burton is a 47 year old male who presents today for hospital follow up.  [facility] with a chief complaint of chest pain. He also endorsed generalized weakness, fatigue, palpitations, nausea over the last several weeks. He did undergo sinus surgery in March 2019. His pain was located to the substernal/left chest region with tingling to his left upper extremity.   During his stay in the ED he underwent lab work including troponin (negative) and chest xray (negative). ECG was not indicative of acute ischemia. Given his symptoms with risk factors for cardiac event he was admitted to overnight for observation.   During his hospital stay he was consulted by cardiology who recommended cardiac catheterization. He had angiographically normal coronary arteries, normal LV function with mildly elevated LVEDP. Given these results his chest pain was determined to be nonischemic. All subsequent troponin levels were negative so he was discharged home on 10/16/17 with recommendations for PCP follow up.  Since his discharge home he continues to experience substernal chest pain, palpitations, dizziness, nausea. He is stressed with work, working 6 days weekly for 9-12 hours at a time. He was also told by his ENT that allergies may be contributing to his dizziness, fatigue, nausea. He plans on scheduling an appointment with ENT soon. He's also been adding Zantac and Pepto Bismol with his Dexilant for GERD.  He plans on resuming his CPAP machine. He's had the machine for the past one year and has only used it twice due to discomfort with the face mask.  BP Readings from Last 3 Encounters:  10/19/17 130/82  10/16/17 (!) 129/92  08/04/17 (!) 155/99   2) Chronic Knee Pain: Present for years. Pain to the medial patella bilaterally, sometimes to lateral aspect. He underwent plain films last  year and was told every thing look good. He's using Diclofenac Gel, undergoing PT, and wearing braces. His pain dates back to recurrent activity during his time in the [employer] [facility] [employer].   Review of Systems  Respiratory: Negative for shortness of breath.   Cardiovascular: Positive for chest pain.  Gastrointestinal: Positive for nausea.       Esophageal burning  Allergic/Immunologic: Positive for environmental allergies.  Neurological: Positive for dizziness.       Past Medical History:  Diagnosis Date  . Anxiety   . Arthritis    knees  . BPH (benign prostatic hyperplasia)   . Complication of anesthesia    DIFFICULTY WAKING AFTER SINUS SURGERY  . Depression   . Deviated septum   . GERD (gastroesophageal reflux disease)   . Headache   . Hyperlipidemia   . Hypertension   . Irregular heart beat   . Migraines   . OSA (obstructive sleep apnea)    does not use CPAP  . Palpitations   . Seasonal allergies   . Tinnitus   . Urine incontinence      Social History   Socioeconomic History  . Marital status: Married    Spouse name: Not on file  . Number of children: Not on file  . Years of education: Not on file  . Highest education level: Not on file  Occupational History  . Occupation: Production designer, theatre/television/filmmanager for [employer]  Social Needs  . Financial resource strain: Not on file  . Food insecurity:    Worry: Not on file    Inability: Not  on file  . Transportation needs:    Medical: Not on file    Non-medical: Not on file  Tobacco Use  . Smoking status: Never Smoker  . Smokeless tobacco: Current User    Types: Chew  . Tobacco comment: he does need a patch  Substance and Sexual Activity  . Alcohol use: Yes    Comment: social  . Drug use: No  . Sexual activity: Not on file  Lifestyle  . Physical activity:    Days per week: Not on file    Minutes per session: Not on file  . Stress: Not on file  Relationships  . Social connections:    Talks on phone: Not on file    Gets together: Not on  file    Attends religious service: Not on file    Active member of club or organization: Not on file    Attends meetings of clubs or organizations: Not on file    Relationship status: Not on file  . Intimate partner violence:    Fear of current or ex partner: Not on file    Emotionally abused: Not on file    Physically abused: Not on file    Forced sexual activity: Not on file  Other Topics Concern  . Not on file  Social History Narrative   Married.   6 children, 1 grandchild.   Works as a Art therapist   Enjoys spending time with family, video games.     Past Surgical History:  Procedure Laterality Date  . ETHMOIDECTOMY Bilateral 08/04/2017   Procedure: BILATERAL ETHMOIDECTOMY WITH TISSUE REMOVAL;  Surgeon: Newman Pies, MD;  Location: [facility];  Service: ENT;  Laterality: Bilateral;  . EXCISION CHONCHA BULLOSA Left 08/04/2017   Procedure: LEFT CONCHA BULLOSA RESECTION;  Surgeon: Newman Pies, MD;  Location: [facility];  Service: ENT;  Laterality: Left;  . FRONTAL SINUS EXPLORATION Bilateral 08/04/2017   Procedure: BILATERAL FRONTAL RECESS SINUS EXPLORATION;  Surgeon: Newman Pies, MD;  Location: [facility];  Service: ENT;  Laterality: Bilateral;  . LEFT HEART CATH AND CORONARY ANGIOGRAPHY N/A 10/16/2017   Procedure: LEFT HEART CATH AND CORONARY ANGIOGRAPHY;  Surgeon: Marykay Lex, MD;  Location: [facility];  Service: Cardiovascular;  Laterality: N/A;  . MAXILLARY ANTROSTOMY Bilateral 08/04/2017   Procedure: BILATERAL MAXILLARY ANTROSTOMY WITH TISSUE REMOVAL;  Surgeon: Newman Pies, MD;  Location: [facility];  Service: ENT;  Laterality: Bilateral;  . NASAL SEPTOPLASTY W/ TURBINOPLASTY N/A 08/04/2017   Procedure: NASAL SEPTOPLASTY WITH TURBINATE REDUCTION;  Surgeon: Newman Pies, MD;  Location: [facility];  Service: ENT;  Laterality: N/A;  . SINUS ENDO WITH FUSION Bilateral 08/04/2017   Procedure: SINUS ENDO WITH FUSION  NAVIGATION;  Surgeon: Newman Pies, MD;  Location: [facility];  Service: ENT;  Laterality: Bilateral;  . SPHENOIDECTOMY Bilateral 08/04/2017   Procedure: BILATERAL SPHENOIDECTOMY WITH TISSUE REMOVAL;  Surgeon: Newman Pies, MD;  Location: [facility];  Service: ENT;  Laterality: Bilateral;    Family History  Problem Relation Age of Onset  . Arthritis Mother   . Hyperlipidemia Mother   . Hypertension Mother   . Stroke Mother   . Diabetes Mother   . Stomach cancer Mother   . Hyperlipidemia Father   . Heart disease Father 50  . Hypertension Father     Allergies  Allergen Reactions  . Paxil [Paroxetine] Other (See Comments)    Induced panic  Current Outpatient Medications on File Prior to Visit  Medication Sig Dispense Refill  . aspirin EC 81 MG tablet Take 81 mg by mouth daily.    Marland Kitchen bismuth subsalicylate (PEPTO BISMOL) 262 MG/15ML suspension Take 30 mLs by mouth every 6 (six) hours as needed for indigestion.    . busPIRone (BUSPAR) 5 MG tablet Take 15 mg by mouth 2 (two) times daily.     . cetirizine (ZYRTEC) 10 MG tablet Take 10 mg by mouth daily.    Marland Kitchen DEXILANT 60 MG capsule TAKE 1 CAPSULE BY MOUTH  DAILY 90 capsule 1  . diclofenac sodium (VOLTAREN) 1 % GEL Apply 2-4 g topically daily as needed (for knee pain).     . metoprolol succinate (TOPROL XL) 50 MG 24 hr tablet Take 1 tablet (50 mg total) by mouth daily. Take with or immediately following a meal. 90 tablet 3  . simvastatin (ZOCOR) 20 MG tablet Take 20 mg by mouth every evening.     . topiramate (TOPAMAX) 25 MG tablet Take 25 mg by mouth daily as needed (at onset of headaches).     Marland Kitchen UNABLE TO FIND CPAP: At bedtime    . ondansetron (ZOFRAN ODT) 4 MG disintegrating tablet Take 1 tablet (4 mg total) by mouth every 8 (eight) hours as needed for nausea or vomiting. (Patient not taking: Reported on 10/19/2017) 10 tablet 1  . promethazine (PHENERGAN) 25 MG tablet Take 1 tablet (25 mg total) by mouth every 6  (six) hours as needed for nausea or vomiting. (Patient not taking: Reported on 10/19/2017) 12 tablet 1   No current facility-administered medications on file prior to visit.     BP 130/82   Pulse 71   Temp 98.1 F (36.7 C) (Oral)   Ht 5\' 9"  (1.753 m)   Wt 186 lb (84.4 kg)   SpO2 97%   BMI 27.47 kg/m    Objective:   Physical Exam  Constitutional: He appears well-nourished.  Neck: Neck supple.  Cardiovascular: Normal rate and regular rhythm.  Respiratory: Effort normal and breath sounds normal.  Musculoskeletal:       Right knee: He exhibits normal range of motion, no swelling and no bony tenderness. No tenderness found.       Legs: Pain to bilateral medial patella. 5/5 strength to bilateral lower extremities.   Skin: Skin is warm and dry.  Psychiatric: He has a normal mood and affect.           Assessment & Plan:

## 2017-10-19 NOTE — Assessment & Plan Note (Signed)
Ongoing since service in the [employer].  He's completed basic imaging and physical therapy so will send to orthopedics for further evaluation.

## 2017-10-19 NOTE — Assessment & Plan Note (Signed)
Stable in the office today, continue metoprolol succinate.  

## 2017-10-19 NOTE — Assessment & Plan Note (Signed)
Determined to be nonischemic in etiology. Cardiac catheterization unremarkable.   Suspect symptoms could be secondary to a combination of things such as uncontrolled GERD, uncontrolled allergies, and stress.  He will resume allergy meds, continue GERD treatment, and work on reduction in stress.   All hospital notes, labs, imaging reviewed.

## 2017-10-19 NOTE — Assessment & Plan Note (Addendum)
Active symptoms, encouraged to resume medications prescribed by ENT. Suspect allergies to be aggravating GERD.

## 2017-10-19 NOTE — Assessment & Plan Note (Signed)
Underwent sinus surgery in March 2019, non compliant to medications for allergic rhinitis. Recommended he resume medications. He plans on following back up with ENT soon.

## 2017-10-19 NOTE — Assessment & Plan Note (Signed)
Plans on compliance to CPAP as he now has a better fitting face device. Suspect OSA to be partially contributing to symptoms.

## 2017-10-19 NOTE — Assessment & Plan Note (Signed)
No cigarette since hospital stay over the weekend. Rx for nicorette gum provided per patient request and instructions provided for use.

## 2017-10-19 NOTE — Assessment & Plan Note (Signed)
Increased symptoms which are likely a combination of stress, allergies, and uncontrolled OSA. Continue current regimen, resume allergy meds, reduce stress.

## 2017-10-19 NOTE — Patient Instructions (Signed)
Chew gum as directed on package.  During weeks 1-6: Chew every 1-2 hours as needed for a maximum of 24 pieces. Need at least 9 pieces daily.  During weeks 7-9: Chew every 2-4 hours as needed for a maximum of 24 pieces.  During weeks 9-12: Chew every 4-8 hours as needed for a maximum of 24 pieces.   You will be contacted regarding your referral to orthopedics.  Please let us know if you have not been contacted within one week.   Follow up with ENT as discussed. Resume your medications.   It was a pleasure to see you today!

## 2017-10-20 MED FILL — Heparin Sod (Porcine)-NaCl IV Soln 1000 Unit/500ML-0.9%: INTRAVENOUS | Qty: 1000 | Status: AC

## 2017-11-10 DIAGNOSIS — M25561 Pain in right knee: Secondary | ICD-10-CM | POA: Diagnosis not present

## 2017-11-10 DIAGNOSIS — M25552 Pain in left hip: Secondary | ICD-10-CM | POA: Diagnosis not present

## 2017-11-10 DIAGNOSIS — M170 Bilateral primary osteoarthritis of knee: Secondary | ICD-10-CM | POA: Diagnosis not present

## 2017-11-10 DIAGNOSIS — M25562 Pain in left knee: Secondary | ICD-10-CM | POA: Diagnosis not present

## 2017-11-11 DIAGNOSIS — J321 Chronic frontal sinusitis: Secondary | ICD-10-CM | POA: Diagnosis not present

## 2017-11-11 DIAGNOSIS — J320 Chronic maxillary sinusitis: Secondary | ICD-10-CM | POA: Diagnosis not present

## 2017-11-11 DIAGNOSIS — J322 Chronic ethmoidal sinusitis: Secondary | ICD-10-CM | POA: Diagnosis not present

## 2017-11-19 ENCOUNTER — Encounter: Payer: Self-pay | Admitting: Allergy

## 2017-11-19 ENCOUNTER — Ambulatory Visit: Payer: 59 | Admitting: Allergy

## 2017-11-19 VITALS — BP 138/88 | HR 80 | Temp 97.4°F | Resp 18 | Ht <= 58 in | Wt 191.2 lb

## 2017-11-19 DIAGNOSIS — K219 Gastro-esophageal reflux disease without esophagitis: Secondary | ICD-10-CM | POA: Diagnosis not present

## 2017-11-19 DIAGNOSIS — H1010 Acute atopic conjunctivitis, unspecified eye: Secondary | ICD-10-CM | POA: Diagnosis not present

## 2017-11-19 DIAGNOSIS — J309 Allergic rhinitis, unspecified: Secondary | ICD-10-CM

## 2017-11-19 DIAGNOSIS — J4530 Mild persistent asthma, uncomplicated: Secondary | ICD-10-CM | POA: Diagnosis not present

## 2017-11-19 MED ORDER — MONTELUKAST SODIUM 10 MG PO TABS: 10.0000 mg | ORAL_TABLET | Freq: Every day | ORAL | 5 refills | Status: DC

## 2017-11-19 MED ORDER — DEXLANSOPRAZOLE 60 MG PO CPDR
1.0000 | DELAYED_RELEASE_CAPSULE | Freq: Every day | ORAL | 2 refills | Status: DC
Start: 2017-11-19 — End: 2018-07-26

## 2017-11-19 MED ORDER — RANITIDINE HCL 150 MG PO TABS: 150.0000 mg | ORAL_TABLET | Freq: Two times a day (BID) | ORAL | 5 refills | Status: DC

## 2017-11-19 MED ORDER — IPRATROPIUM BROMIDE 0.03 % NA SOLN: 2.0000 | Freq: Four times a day (QID) | NASAL | 5 refills | Status: AC

## 2017-11-19 MED ORDER — KETOTIFEN FUMARATE 0.025 % OP SOLN: 1.0000 [drp] | Freq: Two times a day (BID) | OPHTHALMIC | 5 refills | Status: DC

## 2017-11-19 MED ORDER — LEVALBUTEROL TARTRATE 45 MCG/ACT IN AERO: 2.0000 | INHALATION_SPRAY | Freq: Four times a day (QID) | RESPIRATORY_TRACT | 2 refills | Status: DC | PRN

## 2017-11-19 NOTE — Progress Notes (Signed)
New Patient Note  RE: Ronald Burton MRN: [id number] DOB: 06-Feb-1971 Date of Office Visit: 11/19/2017  Referring provider: Doreene Nest, NP Primary care provider: Doreene Nest, NP  Chief Complaint: allergies  History of present illness: Ronald Burton is a 47 y.o. male presenting today for consultation for allergies.    He feels his allergies are getting worse over the last 4 years.  He reports symptoms of nasal congestion and drainage, itchy watery eyes, sneezing, fatigue, shortness of breath and sometimes he reports that he will get a fast heartbeat and palpitations.  He notices that if he cuts the grass his allergy symptoms seem to be a lot worse where he will develop palpitations as well as feeling weak.  He states when he does get the shortness of breath it may lead him into an anxiety attack which he knows exacerbates everything.  He states he does see a therapist and he does typically employ meditation and breathing techniques when he is having an anxiety attack.  He has seen an allergist in 2016 when he was living in [place] who performed allergy testing and was found to be allergic to 26 out of 30 allergens including mold, dust, pet and pollens.  He has taken a variety of different over-the-counter allergy medications which he states initially he was not very compliant in taking his medications but states that over the last 3-6 months he has been better at taking these medications as prescribed to improve his symptoms.  His current regimen includes singulair and xyzal daily, ketotifen eye drops 1 drop each eye twice a day, nasal atrovent 1 sprays each nostril twice a day, ranitidine twice a day.   He has used flonase in the past.   For his SOB he states he has had an inhaler 1-2 years ago and used it several times but does not recall if the inhaler was albuterol or a different inhaler medication.  He does remember that he felt that his heart was racing after use and made  his chest have a burning sensation.     He is currently being treated with prednisone for 10 days for a sinusitis and has 4 more days of prednisone.  He states he used to get a lot of sinus infections every year up until this year when he had sinus surgery with Dr. Suszanne Conners in March 2019.  He states this is the first sinus infections he has had since the surgery which is a great improvement he feels.    He has GERD.  He was taking dexilant which was helpful but ran out about a month or so ago.  He also takes zantac twice a day.    He has OSA and sleeps with CPAP and is followed by [place].  He states he has noticed that he has less daytime fatigue with nightly use of CPAP.   He does see a cardiologist for history of palpitations and hypertension and is on metoprolol 50 mg daily.  He was hospitalized in June with CC of chest pain, dizziness, lightheadedness and fatigue.  CXR was normal and EKG did not show acute ischemia.  troponins were normal.  He underwent cardiac cath which shows normal coronaries, no aortic stenosis or mitral valve regurgiation with normal LV function.  It was thought his symptoms was due to nonischemic chest pain.  He states he feels a lot of symptoms are related to his allergies and his anxiety.    Review of systems: Review of  Systems  Constitutional: Negative for chills, fever and malaise/fatigue.  HENT: Positive for congestion and sinus pain. Negative for ear discharge, ear pain, nosebleeds and sore throat.   Eyes: Negative for pain, discharge and redness.  Respiratory: Negative for cough, sputum production, shortness of breath and wheezing.   Cardiovascular: Negative for chest pain.  Gastrointestinal: Negative for abdominal pain, constipation, diarrhea, heartburn, nausea and vomiting.  Musculoskeletal: Negative for joint pain.  Skin: Negative for itching and rash.  Neurological: Negative for headaches.    All other systems negative unless noted above in HPI  Past medical  history: Past Medical History:  Diagnosis Date  . Anxiety   . Arthritis    knees  . BPH (benign prostatic hyperplasia)   . Complication of anesthesia    DIFFICULTY WAKING AFTER SINUS SURGERY  . Depression   . Deviated septum   . GERD (gastroesophageal reflux disease)   . Headache   . Hyperlipidemia   . Hypertension   . Irregular heart beat   . Migraines   . OSA (obstructive sleep apnea)    does not use CPAP  . Palpitations   . Seasonal allergies   . Tinnitus   . Urine incontinence     Past surgical history: Past Surgical History:  Procedure Laterality Date  . ETHMOIDECTOMY Bilateral 08/04/2017   Procedure: BILATERAL ETHMOIDECTOMY WITH TISSUE REMOVAL;  Surgeon: Newman Pies, MD;  Location: [facility];  Service: ENT;  Laterality: Bilateral;  . EXCISION CHONCHA BULLOSA Left 08/04/2017   Procedure: LEFT CONCHA BULLOSA RESECTION;  Surgeon: Newman Pies, MD;  Location: [facility];  Service: ENT;  Laterality: Left;  . FRONTAL SINUS EXPLORATION Bilateral 08/04/2017   Procedure: BILATERAL FRONTAL RECESS SINUS EXPLORATION;  Surgeon: Newman Pies, MD;  Location: [facility];  Service: ENT;  Laterality: Bilateral;  . LEFT HEART CATH AND CORONARY ANGIOGRAPHY N/A 10/16/2017   Procedure: LEFT HEART CATH AND CORONARY ANGIOGRAPHY;  Surgeon: Marykay Lex, MD;  Location: [facility];  Service: Cardiovascular;  Laterality: N/A;  . MAXILLARY ANTROSTOMY Bilateral 08/04/2017   Procedure: BILATERAL MAXILLARY ANTROSTOMY WITH TISSUE REMOVAL;  Surgeon: Newman Pies, MD;  Location: [facility];  Service: ENT;  Laterality: Bilateral;  . NASAL SEPTOPLASTY W/ TURBINOPLASTY N/A 08/04/2017   Procedure: NASAL SEPTOPLASTY WITH TURBINATE REDUCTION;  Surgeon: Newman Pies, MD;  Location: [facility];  Service: ENT;  Laterality: N/A;  . SINOSCOPY    . SINUS ENDO WITH FUSION Bilateral 08/04/2017   Procedure: SINUS ENDO WITH FUSION NAVIGATION;  Surgeon: Newman Pies, MD;  Location: [facility];  Service: ENT;  Laterality: Bilateral;  . SPHENOIDECTOMY Bilateral 08/04/2017   Procedure: BILATERAL SPHENOIDECTOMY WITH TISSUE REMOVAL;  Surgeon: Newman Pies, MD;  Location: [facility];  Service: ENT;  Laterality: Bilateral;    Family history:  Family History  Problem Relation Age of Onset  . Arthritis Mother   . Hyperlipidemia Mother   . Hypertension Mother   . Stroke Mother   . Diabetes Mother   . Stomach cancer Mother   . Hyperlipidemia Father   . Heart disease Father 45  . Hypertension Father     Social history: Lives in a home with carpeting with gas heating and central cooling.  No pets in the home.  No concern for water damage, mildew or roaches in the home.  He works as a Geologist, engineering.   Tobacco Use  . Smoking status: Never Smoker  .  Smokeless tobacco: Current User    Types: Chew  . Tobacco comment: he does need a patch    Medication List: Allergies as of 11/19/2017      Reactions   Paxil [paroxetine] Other (See Comments)   Induced panic      Medication List        Accurate as of 11/19/17  6:06 PM. Always use your most recent med list.          aspirin EC 81 MG tablet Take 81 mg by mouth daily.   bismuth subsalicylate 262 MG/15ML suspension Commonly known as:  PEPTO BISMOL Take 30 mLs by mouth every 6 (six) hours as needed for indigestion.   busPIRone 5 MG tablet Commonly known as:  BUSPAR Take 15 mg by mouth 2 (two) times daily.   cetirizine 10 MG tablet Commonly known as:  ZYRTEC Take 10 mg by mouth daily.   dexlansoprazole 60 MG capsule Commonly known as:  DEXILANT Take 1 capsule (60 mg total) by mouth daily.   diclofenac sodium 1 % Gel Commonly known as:  VOLTAREN Apply 2-4 g topically daily as needed (for knee pain).   ipratropium 0.03 % nasal spray Commonly known as:  ATROVENT Place 2 sprays into both nostrils 4 (four) times daily.   ketotifen 0.025 % ophthalmic  solution Commonly known as:  ZADITOR Place 1 drop into both eyes 2 (two) times daily.   levalbuterol 45 MCG/ACT inhaler Commonly known as:  XOPENEX HFA Inhale 2 puffs into the lungs every 6 (six) hours as needed for wheezing.   levocetirizine 2.5 MG/5ML solution Commonly known as:  XYZAL Take 2.5 mg by mouth every evening.   meclizine 25 MG tablet Commonly known as:  ANTIVERT Take 25 mg by mouth 3 (three) times daily as needed for dizziness.   metoprolol succinate 50 MG 24 hr tablet Commonly known as:  TOPROL XL Take 1 tablet (50 mg total) by mouth daily. Take with or immediately following a meal.   montelukast 10 MG tablet Commonly known as:  SINGULAIR Take 1 tablet (10 mg total) by mouth at bedtime.   nicotine polacrilex 4 MG gum Commonly known as:  NICORETTE Take 1 each (4 mg total) by mouth as needed for smoking cessation.   ondansetron 4 MG disintegrating tablet Commonly known as:  ZOFRAN ODT Take 1 tablet (4 mg total) by mouth every 8 (eight) hours as needed for nausea or vomiting.   promethazine 25 MG tablet Commonly known as:  PHENERGAN Take 1 tablet (25 mg total) by mouth every 6 (six) hours as needed for nausea or vomiting.   ranitidine 300 MG capsule Commonly known as:  ZANTAC Take 300 mg by mouth every evening.   ranitidine 150 MG tablet Commonly known as:  ZANTAC Take 1 tablet (150 mg total) by mouth 2 (two) times daily.   simvastatin 20 MG tablet Commonly known as:  ZOCOR Take 20 mg by mouth every evening.   topiramate 25 MG tablet Commonly known as:  TOPAMAX Take 25 mg by mouth daily as needed (at onset of headaches).   UNABLE TO FIND CPAP: At bedtime       Known medication allergies: Allergies  Allergen Reactions  . Paxil [Paroxetine] Other (See Comments)    Induced panic     Physical examination: Blood pressure 138/88, pulse 80, temperature (!) 97.4 F (36.3 C), temperature source Oral, resp. rate 18, height 4' 8.75" (1.441 m),  weight 191 lb 3.2 oz (86.7 kg), SpO2 97 %.  General: Alert, interactive, in no acute distress. HEENT: PERRLA, TMs pearly gray, turbinates moderately edematous without discharge, post-pharynx non erythematous. Neck: Supple without lymphadenopathy. Lungs: Clear to auscultation without wheezing, rhonchi or rales. {no increased work of breathing. CV: Normal S1, S2 without murmurs. Abdomen: Nondistended, nontender. Skin: Warm and dry, without lesions or rashes. Extremities:  No clubbing, cyanosis or edema. Neuro:   Grossly intact.  Diagnositics/Labs: Component     Latest Ref Rng & Units 10/16/2017  WBC     4.0 - 10.5 K/uL 11.0 (H)  RBC     4.22 - 5.81 MIL/uL 5.19  Hemoglobin     13.0 - 17.0 g/dL 16.114.2  HCT     09.639.0 - 04.552.0 % 42.8  MCV     78.0 - 100.0 fL 82.5  MCH     26.0 - 34.0 pg 27.4  MCHC     30.0 - 36.0 g/dL 40.933.2  RDW     81.111.5 - 91.415.5 % 12.5  Platelets     150 - 400 K/uL 331   Component     Latest Ref Rng & Units 10/16/2017  Sodium     135 - 145 mmol/L 138  Potassium     3.5 - 5.1 mmol/L 4.2  Chloride     101 - 111 mmol/L 106  CO2     22 - 32 mmol/L 24  Glucose     65 - 99 mg/dL 782100 (H)  BUN     6 - 20 mg/dL 9  Creatinine     9.560.61 - 1.24 mg/dL 2.131.11  Calcium     8.9 - 10.3 mg/dL 9.3  GFR, Est Non African American     >60 mL/min >60  GFR, Est African American     >60 mL/min >60  Anion gap     5 - 15 8   CXR 10/15/17 personally reviewed and is normal without consolidation/infiltrate  Spirometry: FEV1: 2.28L 113%, FVC: 2.67L 108%, ratio consistent with nonobstructive pattern  Allergy testing: deferred due to antihistamine use and current sinusitis   Assessment and plan:   Allergic rhinoconjunctivitis    - will obtain environmental allergy panel.  If negative will need to return for skin testing visit off antihistamine for 72hr    - provided with Ryvent samples today.  Advised to take first dose in evening to determine if will make drowsy and if not he can  take 1 table twice a day.  He will let us know if this is more effective than the 2nd generation antihistamine (ie. Xyzal, zyrtec, allegra) at which time will send in a prescription    - continue singulair 10mg  daily    - continue nasal atrovent 2 sprays each nostril up to 3-4 times a day as needed for nasal drainage/congestion    - continue ketotifen eye drops 1 drop eye each up to twice a day as needed daily for itchy/watery/red eyes.      - he likely is eligible for allergen immunotherapy (allergy injections) given history of sensitivity on allergy testing from 2016.  Awllergen immunotherapy discussed today including protocol, benefits and risk.  Informational handout provided.  I did discuss today that his metropolol daily use puts him at risk for not responding to epinephrine if needing to use to treat allergic reaction which is a risk of allergen immunotherapy.  Discussed it would be ideal off of beta-blockers however if able to decrease dose to 25mg  or lower he would then qualify for initiation of  allergen immunotherapy.  Please discuss alternative options to metoprolol with your cardiologist if wanting to initiate immunotherapy.    Shortness of breath   - most likely related to allergic trigger.  Anxiety can also exacerbate respiratory symptoms.  Agree with using meditation and breathing techniques if shortness of breath is occurring   - also recommend he have access to Xopenex inhaler to use when short of breath to determine if this will relieve this symptom.     Use xopenx inhaler 2 puffs every 4-6 hours as needed for cough/wheeze/shortness of breath/chest tightness.  Monitor frequency of use.    GERD   - will refill Dexilant 60mg  for daily use for next several weeks   - continue ranitidine 150mg  1 tablet twice a day  Follow-up 3 months or sooner if needed  I appreciate the opportunity to take part in Euclide's care. Please do not hesitate to contact me with  questions.  Sincerely,   Margo Aye, MD Allergy/Immunology [facility]

## 2017-11-19 NOTE — Patient Instructions (Addendum)
Allergic rhinoconjunctivitis    - will obtain environmental allergy panel.  If negative will need to return for skin testing visit off antihistamine for 72hr    - provided with Ryvent samples today.  Advised to take first dose in evening to determine if will make drowsy and if not he can take 1 table twice a day.  He will let us know if this is more effective than the 2nd generation antihistamine (ie. Xyzal, zyrtec, allegra) at which time will send in a prescription    - continue singulair 10mg  daily    - continue nasal atrovent 2 sprays each nostril up to 3-4 times a day as needed for nasal drainage/congestion    - continue ketotifen eye drops 1 drop eye each up to twice a day as needed daily for itchy/watery/red eyes.      - he likely is eligible for allergen immunotherapy (allergy injections) given history of sensitivity on allergy testing from 2016.  Awllergen immunotherapy discussed today including protocol, benefits and risk.  Informational handout provided.  I did discuss today that his metropolol daily use puts him at risk for not responding to epinephrine if needing to use to treat allergic reaction which is a risk of allergen immunotherapy.  Discussed it would be ideal off of beta-blockers however if able to decrease dose to 25mg  or lower he would then qualify for initiation of allergen immunotherapy.  Please discuss alternative options to metoprolol with your cardiologist if wanting to initiate immunotherapy.    Shortness of breath   - most likely related to allergic trigger.  Anxiety can also exacerbate respiratory symptoms.  Agree with using meditation and breathing techniques if shortness of breath is occurring   - also recommend he have access to Xopenex inhaler to use when short of breath to determine if this will relieve this symptom.     Use xopenx inhaler 2 puffs every 4-6 hours as needed for cough/wheeze/shortness of breath/chest tightness.  Monitor frequency of use.    GERD   -  will refill Dexilant 60mg  for daily use for next several weeks   - continue ranitidine 150mg  1 tablet twice a day  Follow-up 3 months or sooner if needed

## 2017-11-22 LAB — ALLERGENS W/TOTAL IGE AREA 2
Bermuda Grass IgE: <0.1 kU/L
Common Silver Birch IgE: <0.1 kU/L
Dog Dander IgE: <0.1 kU/L
Elm, American IgE: <0.1 kU/L
IGE (IMMUNOGLOBULIN E), SERUM: 68 [IU]/mL (ref 6–495)
Johnson Grass IgE: <0.1 kU/L
Maple/Box Elder IgE: <0.1 kU/L
Mouse Urine IgE: <0.1 kU/L
Oak, White IgE: <0.1 kU/L
Pecan, Hickory IgE: <0.1 kU/L
Penicillium Chrysogen IgE: <0.1 kU/L
Sheep Sorrel IgE Qn: <0.1 kU/L
Timothy Grass IgE: <0.1 kU/L
White Mulberry IgE: <0.1 kU/L

## 2017-12-01 DIAGNOSIS — M25561 Pain in right knee: Secondary | ICD-10-CM | POA: Diagnosis not present

## 2017-12-01 DIAGNOSIS — M25562 Pain in left knee: Secondary | ICD-10-CM | POA: Diagnosis not present

## 2017-12-01 DIAGNOSIS — M170 Bilateral primary osteoarthritis of knee: Secondary | ICD-10-CM | POA: Diagnosis not present

## 2017-12-02 ENCOUNTER — Telehealth: Payer: Self-pay | Admitting: Podiatry

## 2017-12-02 NOTE — Telephone Encounter (Signed)
Called and let Mrs. Ronald Burton know that her husbands medical records he requested were ready to be picked up at their convenience at the front desk.

## 2017-12-16 ENCOUNTER — Other Ambulatory Visit: Payer: Self-pay | Admitting: Primary Care

## 2017-12-16 DIAGNOSIS — J322 Chronic ethmoidal sinusitis: Secondary | ICD-10-CM | POA: Diagnosis not present

## 2017-12-16 DIAGNOSIS — J320 Chronic maxillary sinusitis: Secondary | ICD-10-CM | POA: Diagnosis not present

## 2017-12-16 DIAGNOSIS — Z0000 Encounter for general adult medical examination without abnormal findings: Secondary | ICD-10-CM

## 2017-12-16 DIAGNOSIS — J321 Chronic frontal sinusitis: Secondary | ICD-10-CM | POA: Diagnosis not present

## 2017-12-16 DIAGNOSIS — I10 Essential (primary) hypertension: Secondary | ICD-10-CM

## 2017-12-22 ENCOUNTER — Encounter: Payer: Self-pay | Admitting: Primary Care

## 2017-12-22 ENCOUNTER — Ambulatory Visit: Payer: 59 | Admitting: Primary Care

## 2017-12-22 ENCOUNTER — Ambulatory Visit (INDEPENDENT_AMBULATORY_CARE_PROVIDER_SITE_OTHER)
Admission: RE | Admit: 2017-12-22 | Discharge: 2017-12-22 | Disposition: A | Discharge status: Home / Self Care | Payer: 59 | Source: Ambulatory Visit | Attending: Primary Care | Admitting: Primary Care

## 2017-12-22 VITALS — BP 120/84 | HR 81 | Temp 98.0°F | Ht 68.75 in | Wt 196.8 lb

## 2017-12-22 DIAGNOSIS — M25562 Pain in left knee: Secondary | ICD-10-CM

## 2017-12-22 DIAGNOSIS — G8929 Other chronic pain: Secondary | ICD-10-CM

## 2017-12-22 DIAGNOSIS — M1712 Unilateral primary osteoarthritis, left knee: Secondary | ICD-10-CM | POA: Diagnosis not present

## 2017-12-22 DIAGNOSIS — M25561 Pain in right knee: Secondary | ICD-10-CM

## 2017-12-22 NOTE — Patient Instructions (Signed)
Complete xray(s) prior to leaving today. I will notify you of your results once received. Have the ladies burn a disc of your xrays for the orthopedist in [place].   Feel free to schedule an appointment with Dr. Patsy Lageropland, our Sports Medicine doctor, for further evaluation of your knees.  Continue to wear the knee brace for support. Continue diclofenac gel as needed.  It was a pleasure to see you today!

## 2017-12-22 NOTE — Progress Notes (Signed)
Subjective:    Patient ID: Ronald Burton, male    DOB: 1970-06-09, 47 y.o.   MRN: [id number]  HPI  Ronald Burton is a 47 year old male who presents today with a chief complaint of knee pain.  His pain is located predominantly to the left knee but more recently to the right as weel. He has a history of chronic knee pain. He also feels knees will "give" out when going down stairs and will walking. He does follow through the [facility] and over the years has undergone plain film imaging, physical therapy, and oral medications for inflammation with temporary improvement.   He last underwent imaging (xray) about one year ago, and was told at that time that his knees were "better", but he didn't agree. He does have an appointment next month with [place] in [place]. He has paperwork regarding his benefits that is needing completed.   He is wearing a knee brace to the left and right knees (different times) and applying diclofenac gel with temporary improvement. He does walk a lot for his occupation and will notice increased discomfort/pain towards the end of the day. He will occasionally experience intermittent tingling to bilateral lower extremities distal to the knee down to the foot.   Review of Systems  Musculoskeletal: Positive for arthralgias. Negative for joint swelling.  Skin: Negative for color change.  Neurological: Positive for weakness and numbness.       Past Medical History:  Diagnosis Date  . Anxiety   . Arthritis    knees  . BPH (benign prostatic hyperplasia)   . Complication of anesthesia    DIFFICULTY WAKING AFTER SINUS SURGERY  . Depression   . Deviated septum   . GERD (gastroesophageal reflux disease)   . Headache   . Hyperlipidemia   . Hypertension   . Irregular heart beat   . Migraines   . OSA (obstructive sleep apnea)    does not use CPAP  . Palpitations   . Seasonal allergies   . Tinnitus   . Urine incontinence      Social History   Socioeconomic  History  . Marital status: Married    Spouse name: Not on file  . Number of children: Not on file  . Years of education: Not on file  . Highest education level: Not on file  Occupational History  . Occupation: Production designer, theatre/television/filmmanager for [employer]  Social Needs  . Financial resource strain: Not on file  . Food insecurity:    Worry: Not on file    Inability: Not on file  . Transportation needs:    Medical: Not on file    Non-medical: Not on file  Tobacco Use  . Smoking status: Never Smoker  . Smokeless tobacco: Current User    Types: Chew  . Tobacco comment: he does need a patch  Substance and Sexual Activity  . Alcohol use: Yes    Comment: social  . Drug use: No  . Sexual activity: Not on file  Lifestyle  . Physical activity:    Days per week: Not on file    Minutes per session: Not on file  . Stress: Not on file  Relationships  . Social connections:    Talks on phone: Not on file    Gets together: Not on file    Attends religious service: Not on file    Active member of club or organization: Not on file    Attends meetings of clubs or organizations: Not on file  Relationship status: Not on file  . Intimate partner violence:    Fear of current or ex partner: Not on file    Emotionally abused: Not on file    Physically abused: Not on file    Forced sexual activity: Not on file  Other Topics Concern  . Not on file  Social History Narrative   Married.   6 children, 1 grandchild.   Works as a Art therapistgeneral manager   Enjoys spending time with family, video games.     Past Surgical History:  Procedure Laterality Date  . ETHMOIDECTOMY Bilateral 08/04/2017   Procedure: BILATERAL ETHMOIDECTOMY WITH TISSUE REMOVAL;  Surgeon: Newman Pieseoh, Su, MD;  Location: [facility];  Service: ENT;  Laterality: Bilateral;  . EXCISION CHONCHA BULLOSA Left 08/04/2017   Procedure: LEFT CONCHA BULLOSA RESECTION;  Surgeon: Newman Pieseoh, Su, MD;  Location: [facility];  Service: ENT;  Laterality:  Left;  . FRONTAL SINUS EXPLORATION Bilateral 08/04/2017   Procedure: BILATERAL FRONTAL RECESS SINUS EXPLORATION;  Surgeon: Newman Pieseoh, Su, MD;  Location: [facility];  Service: ENT;  Laterality: Bilateral;  . LEFT HEART CATH AND CORONARY ANGIOGRAPHY N/A 10/16/2017   Procedure: LEFT HEART CATH AND CORONARY ANGIOGRAPHY;  Surgeon: Marykay LexHarding, David W, MD;  [facility];  Service: Cardiovascular;  Laterality: N/A;  . MAXILLARY ANTROSTOMY Bilateral 08/04/2017   Procedure: BILATERAL MAXILLARY ANTROSTOMY WITH TISSUE REMOVAL;  Surgeon: Newman Pieseoh, Su, MD;  Location: [facility];  Service: ENT;  Laterality: Bilateral;  . NASAL SEPTOPLASTY W/ TURBINOPLASTY N/A 08/04/2017   Procedure: NASAL SEPTOPLASTY WITH TURBINATE REDUCTION;  Surgeon: Newman Pieseoh, Su, MD;  Location: [facility];  Service: ENT;  Laterality: N/A;  . SINOSCOPY    . SINUS ENDO WITH FUSION Bilateral 08/04/2017   Procedure: SINUS ENDO WITH FUSION NAVIGATION;  Surgeon: Newman Pieseoh, Su, MD;  Location: [facility];  Service: ENT;  Laterality: Bilateral;  . SPHENOIDECTOMY Bilateral 08/04/2017   Procedure: BILATERAL SPHENOIDECTOMY WITH TISSUE REMOVAL;  Surgeon: Newman Pieseoh, Su, MD;  Location: [facility];  Service: ENT;  Laterality: Bilateral;    Family History  Problem Relation Age of Onset  . Arthritis Mother   . Hyperlipidemia Mother   . Hypertension Mother   . Stroke Mother   . Diabetes Mother   . Stomach cancer Mother   . Hyperlipidemia Father   . Heart disease Father 3950  . Hypertension Father     Allergies  Allergen Reactions  . Paxil [Paroxetine] Other (See Comments)    Induced panic    Current Outpatient Medications on File Prior to Visit  Medication Sig Dispense Refill  . aspirin EC 81 MG tablet Take 81 mg by mouth daily.    Marland Kitchen. bismuth subsalicylate (PEPTO BISMOL) 262 MG/15ML suspension Take 30 mLs by mouth every 6 (six) hours as needed for indigestion.    . busPIRone (BUSPAR) 5  MG tablet Take 15 mg by mouth 2 (two) times daily.     . cetirizine (ZYRTEC) 10 MG tablet Take 10 mg by mouth daily.    Marland Kitchen. dexlansoprazole (DEXILANT) 60 MG capsule Take 1 capsule (60 mg total) by mouth daily. 90 capsule 2  . diclofenac sodium (VOLTAREN) 1 % GEL Apply 2-4 g topically daily as needed (for knee pain).     Marland Kitchen. ipratropium (ATROVENT) 0.03 % nasal spray Place 2 sprays into both nostrils 4 (four) times daily. 30 mL 5  . ketotifen (ZADITOR) 0.025 % ophthalmic solution Place 1 drop into  both eyes 2 (two) times daily. 5 mL 5  . levalbuterol (XOPENEX HFA) 45 MCG/ACT inhaler Inhale 2 puffs into the lungs every 6 (six) hours as needed for wheezing. 1 Inhaler 2  . levocetirizine (XYZAL) 2.5 MG/5ML solution Take 2.5 mg by mouth every evening.    . meclizine (ANTIVERT) 25 MG tablet Take 25 mg by mouth 3 (three) times daily as needed for dizziness.    . metoprolol succinate (TOPROL XL) 50 MG 24 hr tablet Take 1 tablet (50 mg total) by mouth daily. Take with or immediately following a meal. 90 tablet 3  . montelukast (SINGULAIR) 10 MG tablet Take 1 tablet (10 mg total) by mouth at bedtime. 30 tablet 5  . nicotine polacrilex (NICORETTE) 4 MG gum Take 1 each (4 mg total) by mouth as needed for smoking cessation. 100 tablet 0  . ranitidine (ZANTAC) 150 MG tablet Take 1 tablet (150 mg total) by mouth 2 (two) times daily. 60 tablet 5  . ranitidine (ZANTAC) 300 MG capsule Take 300 mg by mouth every evening.    . simvastatin (ZOCOR) 20 MG tablet Take 20 mg by mouth every evening.     . topiramate (TOPAMAX) 25 MG tablet Take 25 mg by mouth daily as needed (at onset of headaches).     Marland Kitchen UNABLE TO FIND CPAP: At bedtime    . ondansetron (ZOFRAN ODT) 4 MG disintegrating tablet Take 1 tablet (4 mg total) by mouth every 8 (eight) hours as needed for nausea or vomiting. (Patient not taking: Reported on 12/22/2017) 10 tablet 1  . promethazine (PHENERGAN) 25 MG tablet Take 1 tablet (25 mg total) by mouth every 6 (six)  hours as needed for nausea or vomiting. 12 tablet 1   No current facility-administered medications on file prior to visit.     BP 120/84   Pulse 81   Temp 98 F (36.7 C) (Oral)   Ht 5' 8.75" (1.746 m)   Wt 196 lb 12 oz (89.2 kg)   SpO2 98%   BMI 29.27 kg/m    Objective:   Physical Exam  Constitutional: He appears well-nourished.  Cardiovascular: Normal rate.  Respiratory: Effort normal.  Musculoskeletal:       Right knee: He exhibits normal range of motion and no bony tenderness. No tenderness found.       Left knee: He exhibits decreased range of motion. He exhibits no bony tenderness. No tenderness found.  4/5 strength to left lower extremity during seated position on exam table.   Skin: Skin is warm and dry.           Assessment & Plan:

## 2017-12-22 NOTE — Assessment & Plan Note (Addendum)
Chronic for years, will be seeing orthopedics next month for further evaluation. Check plain films today, will burn disc. Discussed other options including Sports Medicine evaluation for which he is considering. May need MRI. May do well with steroid injections depending on etiology for symptoms.  Will complete paperwork but will need insight from either Sports Medicine or Orthopedics.

## 2017-12-24 ENCOUNTER — Other Ambulatory Visit: Payer: 59

## 2017-12-24 DIAGNOSIS — M25561 Pain in right knee: Secondary | ICD-10-CM | POA: Diagnosis not present

## 2017-12-24 DIAGNOSIS — M25562 Pain in left knee: Secondary | ICD-10-CM | POA: Diagnosis not present

## 2017-12-26 DIAGNOSIS — M25562 Pain in left knee: Secondary | ICD-10-CM | POA: Diagnosis not present

## 2017-12-26 DIAGNOSIS — M25561 Pain in right knee: Secondary | ICD-10-CM | POA: Diagnosis not present

## 2017-12-28 ENCOUNTER — Encounter: Payer: 59 | Admitting: Primary Care

## 2017-12-28 ENCOUNTER — Ambulatory Visit: Payer: 59 | Admitting: Family Medicine

## 2017-12-28 DIAGNOSIS — Z0289 Encounter for other administrative examinations: Secondary | ICD-10-CM

## 2017-12-28 DIAGNOSIS — S83241A Other tear of medial meniscus, current injury, right knee, initial encounter: Secondary | ICD-10-CM | POA: Diagnosis not present

## 2017-12-30 ENCOUNTER — Telehealth: Payer: Self-pay

## 2017-12-30 NOTE — Telephone Encounter (Signed)
   [facility] Pre-operative Risk Assessment    Request for surgical clearance:  1. What type of surgery is being performed? RIGHT KNEE SCOPE   2. When is this surgery scheduled? TBD   3. What type of clearance is required (medical clearance vs. Pharmacy clearance to hold med vs. Both)?  MEDICAL  4. Are there any medications that need to be held prior to surgery and how long?NOT LISTED   5. Practice name and name of physician performing surgery?   [facility]   ATTN: [place]  6. What is your office phone number [phone]   7.   What is your office fax number [phone]  8.   Anesthesia type (None, local, MAC, general) ? NOT LISTED   Waylan Rocher 12/30/2017, 2:55 PM  _________________________________________________________________   (provider comments below)

## 2018-01-04 ENCOUNTER — Telehealth: Payer: Self-pay

## 2018-01-04 ENCOUNTER — Ambulatory Visit (INDEPENDENT_AMBULATORY_CARE_PROVIDER_SITE_OTHER): Payer: 59 | Admitting: Primary Care

## 2018-01-04 VITALS — BP 132/82 | HR 78 | Temp 98.1°F

## 2018-01-04 DIAGNOSIS — Z01818 Encounter for other preprocedural examination: Secondary | ICD-10-CM

## 2018-01-04 NOTE — Telephone Encounter (Signed)
Copied from CRM [id number]. Topic: Inquiry >> Jan 04, 2018  2:31 PM Alexander BergeronBarksdale, Harvey B wrote: Reason for CRM: [facility] called to inquire about information concerning the pt's surgical clearance; contact [phone]

## 2018-01-04 NOTE — Patient Instructions (Addendum)
Please bring your forms to the orthopedic doctor for examination.   We have cleared you for surgery. Call your cardiologist regarding clearance.   It was a pleasure to see you today!

## 2018-01-04 NOTE — Telephone Encounter (Signed)
Form completed and handed to [place] for faxing.

## 2018-01-04 NOTE — Progress Notes (Signed)
Subjective:    Patient ID: Ronald Burton, male    DOB: 05/14/1970, 47 y.o.   MRN: [id number]  HPI  Ronald Burton is a 47 year old male who presents today for surgical clearance.   He will be undergoing right knee scope for decreased cartilage. He is currently following with Dr. Thurston Hole through [facility]. He has also requested surgical clearance from his cardiologist. He's undergone extensive cardiac work up within the last year with echocardiogram in November 2018, cardiac catheterization in June 2019. Catheterization without evidence of CAD.  He is currently under disability through the [employer] for chronic pain to his left knee. His recent claim was denied as he was evaluated by an outside doctor who stated that his left knee was unremarkable. He will undergo a right knee scope per orthopedics soon, and was told there was damage to his left knee as well.   He is needing this form completed so he can appeal the rejection for his disability claim. He intends on showing this information to the orthopedist.   Review of Systems  Constitutional: Negative for fever.  Eyes: Negative for visual disturbance.  Respiratory: Negative for shortness of breath.   Cardiovascular: Negative for chest pain.  Gastrointestinal: Negative for nausea.  Musculoskeletal: Positive for arthralgias.       Chronic right knee pain, chronic left knee pain  Neurological: Negative for dizziness, weakness, numbness and headaches.       Past Medical History:  Diagnosis Date  . Anxiety   . Arthritis    knees  . BPH (benign prostatic hyperplasia)   . Complication of anesthesia    DIFFICULTY WAKING AFTER SINUS SURGERY  . Depression   . Deviated septum   . GERD (gastroesophageal reflux disease)   . Headache   . Hyperlipidemia   . Hypertension   . Irregular heart beat   . Migraines   . OSA (obstructive sleep apnea)    does not use CPAP  . Palpitations   . Seasonal allergies   . Tinnitus   . Urine  incontinence      Social History   Socioeconomic History  . Marital status: Married    Spouse name: Not on file  . Number of children: Not on file  . Years of education: Not on file  . Highest education level: Not on file  Occupational History  . 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 Financial resource strain: Not on file  . Food insecurity:    Worry: Not on file    Inability: Not on file  . Transportation needs:    Medical: Not on file    Non-medical: Not on file  Tobacco Use  . Smoking status: Never Smoker  . Smokeless tobacco: Current User    Types: Chew  . Tobacco comment: he does need a patch  Substance and Sexual Activity  . Alcohol use: Yes    Comment: social  . Drug use: No  . Sexual activity: Not on file  Lifestyle  . Physical activity:    Days per week: Not on file    Minutes per session: Not on file  . Stress: Not on file  Relationships  . Social connections:    Talks on phone: Not on file    Gets together: Not on file    Attends religious service: Not on file    Active member of club or organization: Not on file    Attends meetings of clubs or organizations: Not  on file    Relationship status: Not on file  . Intimate partner violence:    Fear of current or ex partner: Not on file    Emotionally abused: Not on file    Physically abused: Not on file    Forced sexual activity: Not on file  Other Topics Concern  . Not on file  Social History Narrative   Married.   6 children, 1 grandchild.   Works as a Art therapistgeneral manager   Enjoys spending time with family, video games.     Past Surgical History:  Procedure Laterality Date  . ETHMOIDECTOMY Bilateral 08/04/2017   Procedure: BILATERAL ETHMOIDECTOMY WITH TISSUE REMOVAL;  Surgeon: Newman Pieseoh, Su, MD;  Location: [facility];  Service: ENT;  Laterality: Bilateral;  . EXCISION CHONCHA BULLOSA Left 08/04/2017   Procedure: LEFT CONCHA BULLOSA RESECTION;  Surgeon: Newman Pieseoh, Su, MD;  Location:  [facility];  Service: ENT;  Laterality: Left;  . FRONTAL SINUS EXPLORATION Bilateral 08/04/2017   Procedure: BILATERAL FRONTAL RECESS SINUS EXPLORATION;  Surgeon: Newman Pieseoh, Su, MD;  Location: [facility];  Service: ENT;  Laterality: Bilateral;  . LEFT HEART CATH AND CORONARY ANGIOGRAPHY N/A 10/16/2017   Procedure: LEFT HEART CATH AND CORONARY ANGIOGRAPHY;  Surgeon: Marykay LexHarding, David W, MD;  [facility];  Service: Cardiovascular;  Laterality: N/A;  . MAXILLARY ANTROSTOMY Bilateral 08/04/2017   Procedure: BILATERAL MAXILLARY ANTROSTOMY WITH TISSUE REMOVAL;  Surgeon: Newman Pieseoh, Su, MD;  Location: [facility];  Service: ENT;  Laterality: Bilateral;  . NASAL SEPTOPLASTY W/ TURBINOPLASTY N/A 08/04/2017   Procedure: NASAL SEPTOPLASTY WITH TURBINATE REDUCTION;  Surgeon: Newman Pieseoh, Su, MD;  Location: [facility];  Service: ENT;  Laterality: N/A;  . SINOSCOPY    . SINUS ENDO WITH FUSION Bilateral 08/04/2017   Procedure: SINUS ENDO WITH FUSION NAVIGATION;  Surgeon: Newman Pieseoh, Su, MD;  Location:  [facility];  Service: ENT;  Laterality: Bilateral;  . SPHENOIDECTOMY Bilateral 08/04/2017   Procedure: BILATERAL SPHENOIDECTOMY WITH TISSUE REMOVAL;  Surgeon: Newman Pieseoh, Su, MD;  Location:  SUR[facility]ERY C[facility];  Service: ENT;  Laterality: Bilateral;    Family History  Problem Relation Age of Onset  . Arthritis Mother   . Hyperlipidemia Mother   . Hypertension Mother   . Stroke Mother   . Diabetes Mother   . Stomach cancer Mother   . Hyperlipidemia Father   . Heart disease Father 1550  . Hypertension Father     Allergies  Allergen Reactions  . Paxil [Paroxetine] Other (See Comments)    Induced panic    Current Outpatient Medications on File Prior to Visit  Medication Sig Dispense Refill  . aspirin EC 81 MG tablet Take 81 mg by mouth daily.    Marland Kitchen. bismuth subsalicylate (PEPTO BISMOL) 262 MG/15ML suspension Take 30 mLs by mouth every 6 (six) hours  as needed for indigestion.    . busPIRone (BUSPAR) 5 MG tablet Take 15 mg by mouth 2 (two) times daily.     . cetirizine (ZYRTEC) 10 MG tablet Take 10 mg by mouth daily.    Marland Kitchen. dexlansoprazole (DEXILANT) 60 MG capsule Take 1 capsule (60 mg total) by mouth daily. 90 capsule 2  . diclofenac sodium (VOLTAREN) 1 % GEL Apply 2-4 g topically daily as needed (for knee pain).     Marland Kitchen. ipratropium (ATROVENT) 0.03 % nasal spray Place 2 sprays into both nostrils 4 (four) times daily. 30 mL 5  . ketotifen (ZADITOR) 0.025 % ophthalmic  solution Place 1 drop into both eyes 2 (two) times daily. 5 mL 5  . levalbuterol (XOPENEX HFA) 45 MCG/ACT inhaler Inhale 2 puffs into the lungs every 6 (six) hours as needed for wheezing. 1 Inhaler 2  . levocetirizine (XYZAL) 2.5 MG/5ML solution Take 2.5 mg by mouth every evening.    . meclizine (ANTIVERT) 25 MG tablet Take 25 mg by mouth 3 (three) times daily as needed for dizziness.    . metoprolol succinate (TOPROL XL) 50 MG 24 hr tablet Take 1 tablet (50 mg total) by mouth daily. Take with or immediately following a meal. 90 tablet 3  . montelukast (SINGULAIR) 10 MG tablet Take 1 tablet (10 mg total) by mouth at bedtime. 30 tablet 5  . nicotine polacrilex (NICORETTE) 4 MG gum Take 1 each (4 mg total) by mouth as needed for smoking cessation. 100 tablet 0  . ondansetron (ZOFRAN ODT) 4 MG disintegrating tablet Take 1 tablet (4 mg total) by mouth every 8 (eight) hours as needed for nausea or vomiting. 10 tablet 1  . promethazine (PHENERGAN) 25 MG tablet Take 1 tablet (25 mg total) by mouth every 6 (six) hours as needed for nausea or vomiting. 12 tablet 1  . ranitidine (ZANTAC) 150 MG tablet Take 1 tablet (150 mg total) by mouth 2 (two) times daily. 60 tablet 5  . ranitidine (ZANTAC) 300 MG capsule Take 300 mg by mouth every evening.    . simvastatin (ZOCOR) 20 MG tablet Take 20 mg by mouth every evening.     . topiramate (TOPAMAX) 25 MG tablet Take 25 mg by mouth daily as needed (at  onset of headaches).     Marland Kitchen UNABLE TO FIND CPAP: At bedtime     No current facility-administered medications on file prior to visit.     BP 132/82   Pulse 78   Temp 98.1 F (36.7 C) (Oral)   SpO2 98%    Objective:   Physical Exam  Constitutional: He is oriented to person, place, and time. He appears well-nourished.  HENT:  Mouth/Throat: No oropharyngeal exudate.  Eyes: Pupils are equal, round, and reactive to light. EOM are normal.  Neck: Neck supple. No thyromegaly present.  Cardiovascular: Normal rate and regular rhythm.  Respiratory: Effort normal and breath sounds normal.  GI: Soft. Bowel sounds are normal. There is no tenderness.  Musculoskeletal: Normal range of motion.  Neurological: He is alert and oriented to person, place, and time.  Skin: Skin is warm and dry.  Psychiatric: He has a normal mood and affect.           Assessment & Plan:  Surgical Clearance:  Will undergo right knee scope soon per Dr. Thurston Hole. Underwent cardiac catheterization in June 2019 without evidence of CAD. Echo from November 2018 also reviewed and unremarkable.  Exam today unremarkable.  Recent hospital labs reviewed. Cleared for surgery from a PCP standpoint. He has initiated clearance from his cardiologist.    Doreene Nest, NP

## 2018-01-04 NOTE — Telephone Encounter (Signed)
Pt has appt today, 01/04/18 at 3:20 for CPX.

## 2018-01-05 NOTE — Telephone Encounter (Signed)
Form was faxed on 01/04/2018

## 2018-01-05 NOTE — Telephone Encounter (Signed)
[facility] needs appt for clearance 

## 2018-01-05 NOTE — Telephone Encounter (Signed)
Left voice message. Heart cath in 10/2017 with normal coronaries. He never followed up in clinic following heart cath. Cardiac clearance will need a phone call, but he will also need a follow up appt. Message sent to nursing to schedule.

## 2018-01-06 NOTE — Telephone Encounter (Signed)
   Primary Cardiologist: Nicki Guadalajarahomas Kelly, MD  Chart reviewed as part of pre-operative protocol coverage. Patient was contacted 01/06/2018 in reference to pre-operative risk assessment for pending surgery as outlined below.  Petro Rolan LipaHartzog was last seen on 04/27/2017 by Dr. Tresa EndoKelly.  Since that day, Elior Meech has done well.  He is not having chest pain or shortness of breath.    He has some issues that he wishes to discuss with Dr. Tresa EndoKelly.  We will make him a follow-up appointment.  Therefore, based on ACC/AHA guidelines, the patient would be at acceptable risk for the planned procedure without further cardiovascular testing.   I will route this recommendation to the requesting party via Epic fax function and remove from pre-op pool.  Please call with questions.  Theodore Demarkhonda Edie Vallandingham, PA-C 01/06/2018, 2:53 PM  [facility]  Salome HolmesOBERT WAINER,MD   ATTNChauncey Mann: SHERI GAVIN   office phone number [phone]    office fax number [phone]

## 2018-01-13 ENCOUNTER — Telehealth: Payer: Self-pay | Admitting: Primary Care

## 2018-01-13 NOTE — Telephone Encounter (Signed)
Place forms in Ronald Burton's inbox

## 2018-01-13 NOTE — Telephone Encounter (Signed)
Pt's wife dropped off [employer] forms that need to be filled out. Put in [employer]

## 2018-01-15 ENCOUNTER — Encounter: Payer: Self-pay | Admitting: Primary Care

## 2018-01-18 NOTE — Telephone Encounter (Signed)
Communicating with patient via mychart.

## 2018-01-19 ENCOUNTER — Telehealth: Payer: Self-pay | Admitting: Primary Care

## 2018-01-19 NOTE — Telephone Encounter (Signed)
Pt's wife dropped off letter to be completed. Placed in RX tower.

## 2018-01-19 NOTE — Telephone Encounter (Signed)
Place in Woodruff Clark's inbox for review.

## 2018-01-20 ENCOUNTER — Other Ambulatory Visit: Payer: Self-pay | Admitting: Primary Care

## 2018-01-20 ENCOUNTER — Other Ambulatory Visit (INDEPENDENT_AMBULATORY_CARE_PROVIDER_SITE_OTHER): Payer: 59

## 2018-01-20 DIAGNOSIS — R7303 Prediabetes: Secondary | ICD-10-CM | POA: Insufficient documentation

## 2018-01-20 DIAGNOSIS — Z0000 Encounter for general adult medical examination without abnormal findings: Secondary | ICD-10-CM | POA: Diagnosis not present

## 2018-01-20 DIAGNOSIS — I10 Essential (primary) hypertension: Secondary | ICD-10-CM | POA: Diagnosis not present

## 2018-01-20 LAB — COMPREHENSIVE METABOLIC PANEL
ALBUMIN: 4.1 g/dL (ref 3.5–5.2)
ALK PHOS: 74 U/L (ref 39–117)
ALT: 23 U/L (ref 0–53)
AST: 15 U/L (ref 0–37)
BUN: 11 mg/dL (ref 6–23)
CALCIUM: 9.3 mg/dL (ref 8.4–10.5)
CO2: 28 mEq/L (ref 19–32)
Chloride: 103 mEq/L (ref 96–112)
Creatinine, Ser: 1.08 mg/dL (ref 0.40–1.50)
GFR: 93.54 mL/min (ref >60.00)
Glucose, Bld: 103 mg/dL — ABNORMAL HIGH (ref 70–99)
POTASSIUM: 3.7 meq/L (ref 3.5–5.1)
Sodium: 137 mEq/L (ref 135–145)
TOTAL PROTEIN: 7 g/dL (ref 6.0–8.3)
Total Bilirubin: 1.3 mg/dL — ABNORMAL HIGH (ref 0.2–1.2)

## 2018-01-20 LAB — HEMOGLOBIN A1C: Hgb A1c MFr Bld: 6 % (ref 4.6–6.5)

## 2018-01-20 NOTE — Assessment & Plan Note (Signed)
Recent A1C of 6.0. Discussed for patient to work on diet and exercise. Repeat in 6 months.

## 2018-01-21 ENCOUNTER — Encounter: Payer: Self-pay | Admitting: Primary Care

## 2018-01-21 NOTE — Telephone Encounter (Signed)
Letter and forms ready for pickup, notified patient's wife.

## 2018-01-26 ENCOUNTER — Encounter: Payer: Self-pay | Admitting: Physician Assistant

## 2018-01-26 ENCOUNTER — Ambulatory Visit: Payer: 59 | Admitting: Physician Assistant

## 2018-01-26 VITALS — BP 122/88 | HR 83 | Ht 69.0 in | Wt 198.6 lb

## 2018-01-26 DIAGNOSIS — R002 Palpitations: Secondary | ICD-10-CM

## 2018-01-26 DIAGNOSIS — Z01810 Encounter for preprocedural cardiovascular examination: Secondary | ICD-10-CM

## 2018-01-26 DIAGNOSIS — J3089 Other allergic rhinitis: Secondary | ICD-10-CM | POA: Diagnosis not present

## 2018-01-26 NOTE — Patient Instructions (Addendum)
Medication Instructions:  Call when you start allergy treatment, we will change the Metoprolol to Cardizem. Track heart rate call if great then 150, or if you are symptomatic from palpitations and want to wear a monitor.   If you need a refill on your cardiac medications before your next appointment, please call your pharmacy.  Labwork: None Ordered.  Testing/Procedures: None Ordered.  Follow-Up: Your physician wants you to follow-up in: 12 months with Dr.Kelly.  You should receive a reminder letter in the mail two months in advance. If you do not receive a letter, please call our office in July 2020 to schedule.  Thank you for choosing [facility]!!

## 2018-01-26 NOTE — Progress Notes (Signed)
Cardiology Office Note   Date:  01/26/2018   ID:  Ronald Burton, DOB 07/13/1970, MRN [id number]  PCP:  Doreene Nest, NP  Cardiologist: Dr. Tresa Endo, 10/16/2017 in hospital Theodore Demark, PA-C   Chief Complaint  Patient presents with  . Follow-up    pt states since last ov, he has had two episodes of sharp chest pain--states it felt like a stab that lasted maybe 3 seconds. C/o DOE and dizziness on occasion as well. States he took his Ranitidine and his anti-anxiety med and the dyspnea and dizziness goes away.    History of Present Illness: Ronald Burton is a 47 y.o. male with a history of HTN, HLD,family h/o premature CAD,palpitations, GERD, OSA noncompliant with CPAP, migraine HAs and anxiety. Admitted 10/15/2017 for the evaluation of exertional fatigue, chest pain and dyspnea>> cath with nl cors and nl EF  Phone notes regarding right knee surgery clearance needed, appointment made.  Ronald Burton presents for cardiology follow up.  He is feeling well in general, is not having consistent exertional chest pain or shortness of breath.  However, he still has CP and SOB at times. Feels the CP was related to reflux, it got better w/ rx.  He has palpitations at times, will feel his heart pound.  By his description, it is regular, and is not going much over 100 bpm.  It does not create any feeling of being lightheaded, no chest pain or shortness of breath.  It resolves on its own.  He takes Zantac up to 150 mg bid, and 300 mg qhs. However, frequently takes lower doses. He takes more when he needs it, it worked on the most recent episodes of CP.   Feels bad every day by about lunch time. Feels heart pounding at times. Gets nausea and dizziness with this.   He has been struggling with allergies, is on multiple medications for that.  He has been working with an Proofreader.  They want to do allergy shots.  He was told that there is a chance for an interaction with the beta-blocker and  the epinephrine.    Past Medical History:  Diagnosis Date  . Anxiety   . Arthritis    knees  . BPH (benign prostatic hyperplasia)   . Complication of anesthesia    DIFFICULTY WAKING AFTER SINUS SURGERY  . Depression   . Deviated septum   . GERD (gastroesophageal reflux disease)   . Headache   . Hyperlipidemia   . Hypertension   . Irregular heart beat   . Migraines   . OSA (obstructive sleep apnea)    does not use CPAP  . Palpitations   . Seasonal allergies   . Tinnitus   . Urine incontinence     Past Surgical History:  Procedure Laterality Date  . ETHMOIDECTOMY Bilateral 08/04/2017   Procedure: BILATERAL ETHMOIDECTOMY WITH TISSUE REMOVAL;  Surgeon: Newman Pies, MD;  Location: [facility];  Service: ENT;  Laterality: Bilateral;  . EXCISION CHONCHA BULLOSA Left 08/04/2017   Procedure: LEFT CONCHA BULLOSA RESECTION;  Surgeon: Newman Pies, MD;  Location: [facility];  Service: ENT;  Laterality: Left;  . FRONTAL SINUS EXPLORATION Bilateral 08/04/2017   Procedure: BILATERAL FRONTAL RECESS SINUS EXPLORATION;  Surgeon: Newman Pies, MD;  Location: [facility];  Service: ENT;  Laterality: Bilateral;  . LEFT HEART CATH AND CORONARY ANGIOGRAPHY N/A 10/16/2017   Procedure: LEFT HEART CATH AND CORONARY ANGIOGRAPHY;  Surgeon: Marykay Lex, MD;  Location:  [facility];  Service: Cardiovascular;  Laterality: N/A;  . MAXILLARY ANTROSTOMY Bilateral 08/04/2017   Procedure: BILATERAL MAXILLARY ANTROSTOMY WITH TISSUE REMOVAL;  Surgeon: Newman Pieseoh, Su, MD;  Location: [facility];  Service: ENT;  Laterality: Bilateral;  . NASAL SEPTOPLASTY W/ TURBINOPLASTY N/A 08/04/2017   Procedure: NASAL SEPTOPLASTY WITH TURBINATE REDUCTION;  Surgeon: Newman Pieseoh, Su, MD;  Location: [facility];  Service: ENT;  Laterality: N/A;  . SINOSCOPY    . SINUS ENDO WITH FUSION Bilateral 08/04/2017   Procedure: SINUS ENDO WITH FUSION NAVIGATION;  Surgeon: Newman Pieseoh, Su, MD;   Location: [facility];  Service: ENT;  Laterality: Bilateral;  . SPHENOIDECTOMY Bilateral 08/04/2017   Procedure: BILATERAL SPHENOIDECTOMY WITH TISSUE REMOVAL;  Surgeon: Newman Pieseoh, Su, MD;  Location: [facility];  Service: ENT;  Laterality: Bilateral;    Current Outpatient Medications  Medication Sig Dispense Refill  . aspirin EC 81 MG tablet Take 81 mg by mouth daily.    Marland Kitchen. bismuth subsalicylate (PEPTO BISMOL) 262 MG/15ML suspension Take 30 mLs by mouth every 6 (six) hours as needed for indigestion.    . busPIRone (BUSPAR) 5 MG tablet Take 15 mg by mouth 2 (two) times daily.     . cetirizine (ZYRTEC) 10 MG tablet Take 10 mg by mouth daily.    Marland Kitchen. dexlansoprazole (DEXILANT) 60 MG capsule Take 1 capsule (60 mg total) by mouth daily. 90 capsule 2  . diclofenac sodium (VOLTAREN) 1 % GEL Apply 2-4 g topically daily as needed (for knee pain).     Marland Kitchen. ipratropium (ATROVENT) 0.03 % nasal spray Place 2 sprays into both nostrils 4 (four) times daily. 30 mL 5  . ketotifen (ZADITOR) 0.025 % ophthalmic solution Place 1 drop into both eyes 2 (two) times daily. 5 mL 5  . levalbuterol (XOPENEX HFA) 45 MCG/ACT inhaler Inhale 2 puffs into the lungs every 6 (six) hours as needed for wheezing. 1 Inhaler 2  . levocetirizine (XYZAL) 2.5 MG/5ML solution Take 2.5 mg by mouth every evening.    . meclizine (ANTIVERT) 25 MG tablet Take 25 mg by mouth 3 (three) times daily as needed for dizziness.    . metoprolol succinate (TOPROL XL) 50 MG 24 hr tablet Take 1 tablet (50 mg total) by mouth daily. Take with or immediately following a meal. 90 tablet 3  . montelukast (SINGULAIR) 10 MG tablet Take 1 tablet (10 mg total) by mouth at bedtime. 30 tablet 5  . nicotine polacrilex (NICORETTE) 4 MG gum Take 1 each (4 mg total) by mouth as needed for smoking cessation. 100 tablet 0  . ondansetron (ZOFRAN ODT) 4 MG disintegrating tablet Take 1 tablet (4 mg total) by mouth every 8 (eight) hours as needed for nausea or  vomiting. 10 tablet 1  . promethazine (PHENERGAN) 25 MG tablet Take 1 tablet (25 mg total) by mouth every 6 (six) hours as needed for nausea or vomiting. 12 tablet 1  . ranitidine (ZANTAC) 150 MG tablet Take 1 tablet (150 mg total) by mouth 2 (two) times daily. 60 tablet 5  . ranitidine (ZANTAC) 300 MG capsule Take 300 mg by mouth every evening.    . simvastatin (ZOCOR) 20 MG tablet Take 20 mg by mouth every evening.     . topiramate (TOPAMAX) 25 MG tablet Take 25 mg by mouth daily as needed (at onset of headaches).     Marland Kitchen. UNABLE TO FIND CPAP: At bedtime     No current facility-administered medications for  this visit.     Allergies:   Paxil [paroxetine]    Social History:  The patient  reports that he has never smoked. His smokeless tobacco use includes chew. He reports that he drinks alcohol. He reports that he does not use drugs.   Family History:  The patient's family history includes Arthritis in his mother; Diabetes in his mother; Heart disease (age of onset: 75) in his father; Hyperlipidemia in his father and mother; Hypertension in his father and mother; Stomach cancer in his mother; Stroke in his mother.    ROS:  Please see the history of present illness. All other systems are reviewed and negative.    PHYSICAL EXAM: VS:  BP 122/88 (BP Location: Left Arm, Patient Position: Sitting, Cuff Size: Normal)   Pulse 83   Ht 5\' 9"  (1.753 m)   Wt 198 lb 9.6 oz (90.1 kg)   BMI 29.33 kg/m  , BMI Body mass index is 29.33 kg/m. GEN: Well nourished, well developed, male in no acute distress  HEENT: normal for age  Neck: no JVD, no carotid bruit, no masses Cardiac: RRR; no murmur, no rubs, or gallops Respiratory:  clear to auscultation bilaterally, normal work of breathing GI: soft, nontender, nondistended, + BS MS: no deformity or atrophy; no edema; distal pulses are 2+ in all 4 extremities   Skin: warm and dry, no rash Neuro:  Strength and sensation are intact Psych: euthymic mood,  full affect   EKG:  EKG is not ordered today.   LEFT HEART CATH AND CORONARY ANGIOGRAPHY  10/16/17  Conclusion     Angiographically normal coronary arteries  The left ventricular systolic function is normal. The left ventricular ejection fraction is 55-65% by visual estimate.  LV end diastolic pressure is mildly elevated.  There is no mitral valve regurgitation.  There is no aortic valve stenosis.   Angiographically normal coronary arteries  Normal LV function with mildly elevated LVEDP.  Likely nonischemic chest pain   Plan: Return to nursing unit for ongoing care, TR band removal. Will be stable for discharge later this afternoon pending further evaluation by primary team.  Standard post-cath precautions  Diagnostic Diagram         Recent Labs: 03/05/2017: Magnesium 2.4; TSH 1.040 10/16/2017: Hemoglobin 14.2; Platelets 331 01/20/2018: ALT 23; BUN 11; Creatinine, Ser 1.08; Potassium 3.7; Sodium 137    Lipid Panel    Component Value Date/Time   CHOL 131 10/16/2017 0308   TRIG 90 10/16/2017 0308   HDL 31 (L) 10/16/2017 0308   CHOLHDL 4.2 10/16/2017 0308   VLDL 18 10/16/2017 0308   LDLCALC 82 10/16/2017 0308     Wt Readings from Last 3 Encounters:  01/26/18 198 lb 9.6 oz (90.1 kg)  12/22/17 196 lb 12 oz (89.2 kg)  11/19/17 191 lb 3.2 oz (86.7 kg)     Other studies Reviewed: Additional studies/ records that were reviewed today include: Office notes, hospital records and testing.  ASSESSMENT AND PLAN:  1.  Palpitations: I told him about Alivecor and also demonstrated 1 of the free phone apps to check a pulse.  I explained the difference between the 2, he will look at getting 1 of them. - I explained that a regular heart rate that was approximately 100 is not likely to be any kind of arrhythmia that needs treatment.  However, he is to call us if his heart rate is greater than 150 -If he wants to look further into it, he is to call  us  2.   Allergies: He wants to get immunotherapy but not right now as it is a gradual process and should take a year. - I have a message into Kristen Alvstad Ph, who was not aware of any significant interaction. - Some online information and Up To Date describe a potential interaction, but most of it seems theoretical. - I have offered to change the patient from metoprolol to Cardizem if he wishes to undergo the immunotherapy. -He is not going to do this right now, he will think about it and let us know  3.  Preoperative evaluation: A message has been sent to the surgeons that he is at acceptable risk for the planned procedures.  Even if he wishes to get the palpitations evaluated further, that should not delay his surgery.   Current medicines are reviewed at length with the patient today.  The patient has concerns regarding medicines.  Concerns were addressed.  The following changes have been made:  no change  Labs/ tests ordered today include:  No orders of the defined types were placed in this encounter.    Disposition:   FU with Dr Tresa Endo  Signed, Theodore Demark, PA-C  01/26/2018 8:55 AM    [facility] Phone: [phone]; Fax: [phone]  This note was written with the assistance of speech recognition software. Please excuse any transcriptional errors.

## 2018-02-02 DIAGNOSIS — H524 Presbyopia: Secondary | ICD-10-CM | POA: Diagnosis not present

## 2018-02-02 DIAGNOSIS — H52221 Regular astigmatism, right eye: Secondary | ICD-10-CM | POA: Diagnosis not present

## 2018-02-02 DIAGNOSIS — H5213 Myopia, bilateral: Secondary | ICD-10-CM | POA: Diagnosis not present

## 2018-03-04 ENCOUNTER — Encounter: Payer: Self-pay | Admitting: Allergy

## 2018-03-04 ENCOUNTER — Ambulatory Visit: Payer: 59 | Admitting: Allergy

## 2018-03-04 VITALS — BP 112/92 | HR 83

## 2018-03-04 DIAGNOSIS — J4530 Mild persistent asthma, uncomplicated: Secondary | ICD-10-CM | POA: Diagnosis not present

## 2018-03-04 DIAGNOSIS — J310 Chronic rhinitis: Secondary | ICD-10-CM | POA: Diagnosis not present

## 2018-03-04 DIAGNOSIS — H1089 Other conjunctivitis: Secondary | ICD-10-CM

## 2018-03-04 DIAGNOSIS — K219 Gastro-esophageal reflux disease without esophagitis: Secondary | ICD-10-CM | POA: Diagnosis not present

## 2018-03-04 MED ORDER — FLUTICASONE PROPIONATE 93 MCG/ACT NA EXHU: 2.0000 | INHALANT_SUSPENSION | Freq: Two times a day (BID) | NASAL | 5 refills | Status: DC

## 2018-03-04 MED ORDER — CARBINOXAMINE MALEATE 6 MG PO TABS: 6.0000 mg | ORAL_TABLET | Freq: Two times a day (BID) | ORAL | 4 refills | Status: DC

## 2018-03-04 NOTE — Patient Instructions (Addendum)
Rhinoconjunctivitis    -environmental allergy panel was negative thus recommend skin testing visit off antihistamines for 72hr prior to visit    - Ryvent has been effective for you and will provide with prescription    - continue singulair 10mg  daily    - continue nasal atrovent 2 sprays each nostril up to 3-4 times a day as needed for nasal drainage    - Xhance nasal device (flonase) demonstrated today.  This device allows for deeper deposition of the steroid medication in to the sinuses for better symptom control.  Use 2 sprays each nostril twice a day.  This spray is best for nasal congestion    - continue ketotifen eye drops 1 drop eye each up to twice a day as needed daily for itchy/watery/red eyes.      - we have discussed allergen immunotherapy if you still show sensivities to aeroallergens which will need to be proven on skin testing.   Shortness of breath   - most likely related to allergic trigger.  Anxiety can also exacerbate respiratory symptoms.  Agree with using meditation and breathing techniques if shortness of breath is occurring   - also recommend he have access to Xopenex inhaler to use when short of breath to determine if this will relieve this symptom.     Use xopenx inhaler 2 puffs every 4-6 hours as needed for cough/wheeze/shortness of breath/chest tightness.  Monitor frequency of use.    GERD   - continue Dexilant 60mg  for daily use   - stop ranitidine (zantac) due to recall.  If wanting to replace recommend pepcid (famotidine) if this has been effective in the past.  Follow-up 4 months or sooner if needed (for skin testing)

## 2018-03-04 NOTE — Progress Notes (Signed)
Follow-up Note  RE: Ronald Burton MRN: [id number] DOB: August 24, 1970 Date of Office Visit: 03/04/2018   History of present illness: Ronald Burton is a 47 y.o. male presenting today for follow-up of allergic rhinoconjunctivitis, shortness of breath and reflux.  He was last seen in the office on November 19, 2017 by myself for initial evaluation.  He denies any major health changes, surgeries or hospitalizations since this visit. He states he has been continued to have issues of nasal congestion and sinus headache and pressure behind the eyes.  He states he has both Flonase and nasal ipratropium and he was not sure which ones to take when.  However he states recently he has not been using either of these nasal spray options.  We did provide him with samples of RyVent after the last visit and he states that these were helpful for him.  He ran out of the samples and read turned to Zyrtec use.  He also has access to ketotifen eyedrops for ocular symptoms which also helps as needed. He states he has not had any worsening of the shortness of breath episodes he was having states it is gotten better.  He states to the point that he has not even required use of the Xopenex inhaler.  He denies any nighttime awakenings.  He denies any ED or urgent visits for this symptom. He states he does continue to have issues with heartburn and has used Dexilant but does not use that regularly.  He also states he will use ranitidine but not regularly either.  He also states he may use Tums as needed.  Review of systems: Review of Systems  Constitutional: Negative for chills, fever and malaise/fatigue.  HENT: Positive for congestion and sinus pain. Negative for ear discharge, ear pain, nosebleeds and sore throat.   Eyes: Negative for pain, discharge and redness.  Respiratory: Negative for cough, sputum production, shortness of breath and wheezing.   Cardiovascular: Negative for chest pain.  Gastrointestinal: Positive for  heartburn. Negative for abdominal pain, constipation, diarrhea, nausea and vomiting.  Musculoskeletal: Negative for joint pain.  Skin: Negative for itching and rash.  Neurological: Positive for headaches. Negative for dizziness.    All other systems negative unless noted above in HPI  Past medical/social/surgical/family history have been reviewed and are unchanged unless specifically indicated below.  No changes  Medication List: Allergies as of 03/04/2018      Reactions   Paxil [paroxetine] Other (See Comments)   Induced panic      Medication List        Accurate as of 03/04/18  7:20 PM. Always use your most recent med list.          aspirin EC 81 MG tablet Take 81 mg by mouth daily.   bismuth subsalicylate 262 MG/15ML suspension Commonly known as:  PEPTO BISMOL Take 30 mLs by mouth every 6 (six) hours as needed for indigestion.   busPIRone 5 MG tablet Commonly known as:  BUSPAR Take 15 mg by mouth 2 (two) times daily.   cetirizine 10 MG tablet Commonly known as:  ZYRTEC Take 10 mg by mouth daily.   dexlansoprazole 60 MG capsule Commonly known as:  DEXILANT Take 1 capsule (60 mg total) by mouth daily.   diclofenac sodium 1 % Gel Commonly known as:  VOLTAREN Apply 2-4 g topically daily as needed (for knee pain).   ipratropium 0.03 % nasal spray Commonly known as:  ATROVENT Place 2 sprays into both nostrils 4 (four)  times daily.   ketotifen 0.025 % ophthalmic solution Commonly known as:  ZADITOR Place 1 drop into both eyes 2 (two) times daily.   levalbuterol 45 MCG/ACT inhaler Commonly known as:  XOPENEX HFA Inhale 2 puffs into the lungs every 6 (six) hours as needed for wheezing.   levocetirizine 2.5 MG/5ML solution Commonly known as:  XYZAL Take 2.5 mg by mouth every evening.   meclizine 25 MG tablet Commonly known as:  ANTIVERT Take 25 mg by mouth 3 (three) times daily as needed for dizziness.   metoprolol succinate 50 MG 24 hr tablet Commonly  known as:  TOPROL-XL Take 1 tablet (50 mg total) by mouth daily. Take with or immediately following a meal.   montelukast 10 MG tablet Commonly known as:  SINGULAIR Take 1 tablet (10 mg total) by mouth at bedtime.   nicotine polacrilex 4 MG gum Commonly known as:  NICORETTE Take 1 each (4 mg total) by mouth as needed for smoking cessation.   ondansetron 4 MG disintegrating tablet Commonly known as:  ZOFRAN-ODT Take 1 tablet (4 mg total) by mouth every 8 (eight) hours as needed for nausea or vomiting.   promethazine 25 MG tablet Commonly known as:  PHENERGAN Take 1 tablet (25 mg total) by mouth every 6 (six) hours as needed for nausea or vomiting.   ranitidine 300 MG capsule Commonly known as:  ZANTAC Take 300 mg by mouth every evening.   ranitidine 150 MG tablet Commonly known as:  ZANTAC Take 1 tablet (150 mg total) by mouth 2 (two) times daily.   simvastatin 20 MG tablet Commonly known as:  ZOCOR Take 20 mg by mouth every evening.   topiramate 25 MG tablet Commonly known as:  TOPAMAX Take 25 mg by mouth daily as needed (at onset of headaches).   UNABLE TO FIND CPAP: At bedtime       Known medication allergies: Allergies  Allergen Reactions  . Paxil [Paroxetine] Other (See Comments)    Induced panic     Physical examination: Blood pressure (!) 112/92, pulse 83, SpO2 97 %.  General: Alert, interactive, in no acute distress. HEENT: PERRLA, TMs pearly gray, turbinates moderately edematous with clear discharge, post-pharynx non erythematous. Neck: Supple without lymphadenopathy. Lungs: Clear to auscultation without wheezing, rhonchi or rales. {no increased work of breathing. CV: Normal S1, S2 without murmurs. Abdomen: Nondistended, nontender. Skin: Warm and dry, without lesions or rashes. Extremities:  No clubbing, cyanosis or edema. Neuro:   Grossly intact.  Diagnositics/Labs: Labs:  Component     Latest Ref Rng & Units 11/19/2017  IgE (Immunoglobulin  E), Serum     6 - 495 IU/mL 68  D Pteronyssinus IgE     Class 0 kU/L <0.10  D Farinae IgE     Class 0 kU/L <0.10  Cat Dander IgE     Class 0 kU/L <0.10  Dog Dander IgE     Class 0 kU/L <0.10  [place] Grass IgE     Class 0 kU/L <0.10  Timothy Grass IgE     Class 0 kU/L <0.10  Johnson Grass IgE     Class 0 kU/L <0.10  Cockroach, German IgE     Class 0 kU/L <0.10  Penicillium Chrysogen IgE     Class 0 kU/L <0.10  Cladosporium Herbarum IgE     Class 0 kU/L <0.10  Aspergillus Fumigatus IgE     Class 0 kU/L <0.10  Alternaria Alternata IgE     Class 0 kU/L <  0.10  Maple/Box Elder IgE     Class 0 kU/L <0.10  Common Silver Charletta Cousin IgE     Class 0 kU/L <0.10  Cedar, [place] IgE     Class 0 kU/L <0.10  Oak, White IgE     Class 0 kU/L <0.10  Elm, American IgE     Class 0 kU/L <0.10  Cottonwood IgE     Class 0 kU/L <0.10  Pecan, Hickory IgE     Class 0 kU/L <0.10  White Mulberry IgE     Class 0 kU/L <0.10  Ragweed, Short IgE     Class 0 kU/L <0.10  Pigweed, Rough IgE     Class 0 kU/L <0.10  Sheep Sorrel IgE Qn     Class 0 kU/L  <0.10  Mouse Urine IgE     Class 0 kU/L <0.10    Assessment and plan:   Chronic rhinitis with conjunctivitis    -environmental allergy panel was negative thus recommend skin testing visit off antihistamines for 72hr prior to visit.  He does have a history of sensitivity in the past on previous testing.    - Ryvent has been effective for you and will provide with prescription    - continue singulair 10mg  daily    - continue nasal atrovent 2 sprays each nostril up to 3-4 times a day as needed for nasal drainage    - Xhance nasal device (flonase) demonstrated today.  This device allows for deeper deposition of the steroid medication in to the sinuses for better symptom control.  Use 2 sprays each nostril twice a day.  This spray is best for nasal congestion    - continue ketotifen eye drops 1 drop eye each up to twice a day as needed daily for  itchy/watery/red eyes.      - we have discussed allergen immunotherapy if you still show sensivities to aeroallergens which will need to be proven on skin testing.   Shortness of breath   - most likely related to allergic trigger.  Anxiety can also exacerbate respiratory symptoms.  Agree with using meditation and breathing techniques if shortness of breath is occurring   - also recommend he have access to Xopenex inhaler to use when short of breath to determine if this will relieve this symptom.     Use xopenx inhaler 2 puffs every 4-6 hours as needed for cough/wheeze/shortness of breath/chest tightness.  Monitor frequency of use.    GERD   - continue Dexilant 60mg  for daily use   - stop ranitidine (zantac) due to recall.  If wanting to replace recommend pepcid (famotidine) if this has been effective in the past.  Follow-up 4 months or sooner if needed (for skin testing) I appreciate the opportunity to take part in Dacoda's care. Please do not hesitate to contact me with questions.  Sincerely,   Margo Aye, MD Allergy/Immunology [facility]

## 2018-04-02 ENCOUNTER — Emergency Department (HOSPITAL_COMMUNITY): Payer: 59

## 2018-04-02 ENCOUNTER — Emergency Department (HOSPITAL_COMMUNITY)
Admission: EM | Admit: 2018-04-02 | Discharge: 2018-04-02 | Disposition: A | Discharge status: Home / Self Care | Payer: 59 | Attending: Emergency Medicine | Admitting: Emergency Medicine

## 2018-04-02 ENCOUNTER — Encounter (HOSPITAL_COMMUNITY): Payer: Self-pay | Admitting: Emergency Medicine

## 2018-04-02 DIAGNOSIS — Z79899 Other long term (current) drug therapy: Secondary | ICD-10-CM | POA: Insufficient documentation

## 2018-04-02 DIAGNOSIS — R0789 Other chest pain: Secondary | ICD-10-CM | POA: Insufficient documentation

## 2018-04-02 DIAGNOSIS — R42 Dizziness and giddiness: Secondary | ICD-10-CM | POA: Diagnosis not present

## 2018-04-02 DIAGNOSIS — Z7982 Long term (current) use of aspirin: Secondary | ICD-10-CM | POA: Diagnosis not present

## 2018-04-02 DIAGNOSIS — I10 Essential (primary) hypertension: Secondary | ICD-10-CM | POA: Diagnosis not present

## 2018-04-02 LAB — CBC
HEMATOCRIT: 48.1 % (ref 39.0–52.0)
HEMOGLOBIN: 16 g/dL (ref 13.0–17.0)
MCH: 27.7 pg (ref 26.0–34.0)
MCHC: 33.3 g/dL (ref 30.0–36.0)
MCV: 83.2 fL (ref 80.0–100.0)
Platelets: 172 10*3/uL (ref 150–400)
RBC: 5.78 MIL/uL (ref 4.22–5.81)
RDW: 12.7 % (ref 11.5–15.5)
WBC: 9.7 10*3/uL (ref 4.0–10.5)
nRBC: 0 % (ref 0.0–0.2)

## 2018-04-02 LAB — BASIC METABOLIC PANEL
Anion gap: 9 (ref 5–15)
BUN: 8 mg/dL (ref 6–20)
CALCIUM: 9.4 mg/dL (ref 8.9–10.3)
CO2: 22 mmol/L (ref 22–32)
Chloride: 108 mmol/L (ref 98–111)
Creatinine, Ser: 1.1 mg/dL (ref 0.61–1.24)
GFR calc non Af Amer: >60 mL/min (ref >60)
Glucose, Bld: 106 mg/dL — ABNORMAL HIGH (ref 70–99)
Potassium: 4.1 mmol/L (ref 3.5–5.1)
Sodium: 139 mmol/L (ref 135–145)

## 2018-04-02 LAB — I-STAT TROPONIN, ED
TROPONIN I, POC: 0 ng/mL (ref 0.00–0.08)
Troponin i, poc: 0.01 ng/mL (ref 0.00–0.08)

## 2018-04-02 LAB — CBG MONITORING, ED: GLUCOSE-CAPILLARY: 87 mg/dL (ref 70–99)

## 2018-04-02 MED ORDER — ASPIRIN 81 MG PO CHEW
324.0000 mg | CHEWABLE_TABLET | Freq: Once | ORAL | Status: AC
  Administered 2018-04-02: 324 mg via ORAL
  Filled 2018-04-02: qty 4

## 2018-04-02 NOTE — ED Notes (Signed)
ED Provider at bedside. 

## 2018-04-02 NOTE — ED Triage Notes (Signed)
Pt c/o dizziness, "not feeling right" has had cold symptoms-- was taking coriceden HBP.

## 2018-04-02 NOTE — ED Triage Notes (Signed)
Pt reports "not feeling right" this am when he woke up, took some cold medication on the way in, at work developed dizziness with position change and thought he would pass out, also had numbness and tingling in bilateral arms and his bilateral legs felt cold, normal movement in all extremities noted, pt ambulatory on arrival, no neuro deficits noted

## 2018-04-02 NOTE — Discharge Instructions (Signed)
Your blood work and chest x-ray today were reassuring.  Please return to the ED immediately for new or worsening symptoms or concerns, such as chest pain, shortness of breath, return of your symptoms, fevers or any concerns at all.

## 2018-04-02 NOTE — ED Provider Notes (Signed)
[facility] EMERGENCY DEPARTMENT Provider Note   CSN: [id number] Arrival date & time: 04/02/18  1011     History   Chief Complaint Chief Complaint  Patient presents with  . Dizziness  . Chest Pain    HPI Ronald Burton is a 47 y.o. male.  HPI 47 year old male, with PMH of HTN, HLD, anxiety, presents with dizziness and chest "discomfort."  Patient states he has not been feeling well with a cough, sputum production, ear pain, rhinorrhea no sore throat for the last several days.  He has been taking Cortisporin which has been helping significantly with his symptoms.  He took it this morning and was initially feeling fine.  Patient states that he started feeling dizzy which he describes as a lightheadedness, presyncopal.  He describes chest pain as a burning pain on the left side of his chest.  He states this is the same pain he gets with his acid reflux.  He notes that he had a period of shortness of breath when the dizziness started however that has resolved.  He states overall he is feeling much better than when symptoms started.  He denies any nausea, vomiting, diaphoresis, visual changes, slurred speech, gait difficulty, weakness.   Past Medical History:  Diagnosis Date  . Anxiety   . Arthritis    knees  . BPH (benign prostatic hyperplasia)   . Complication of anesthesia    DIFFICULTY WAKING AFTER SINUS SURGERY  . Depression   . Deviated septum   . GERD (gastroesophageal reflux disease)   . Headache   . Hyperlipidemia   . Hypertension   . Irregular heart beat   . Migraines   . OSA (obstructive sleep apnea)    does not use CPAP  . Palpitations   . Seasonal allergies   . Tinnitus   . Urine incontinence     Patient Active Problem List   Diagnosis Date Noted  . Prediabetes 01/20/2018  . Chronic pain of both knees 10/19/2017  . Unstable angina (HCC)   . Chest pain 10/15/2017  . OSA (obstructive sleep apnea) 10/15/2017  . Tobacco dependence  10/15/2017  . Recurrent sinusitis 06/11/2017  . Allergic rhinitis 12/24/2016  . Hyperlipidemia 12/24/2016  . Preventative health care 12/24/2016  . Essential hypertension 02/27/2016  . BPH (benign prostatic hyperplasia) 02/27/2016  . Arthralgia 02/27/2016  . GERD (gastroesophageal reflux disease) 02/27/2016    Past Surgical History:  Procedure Laterality Date  . ETHMOIDECTOMY Bilateral 08/04/2017   Procedure: BILATERAL ETHMOIDECTOMY WITH TISSUE REMOVAL;  Surgeon: Newman Pies, MD;  Location: [facility];  Service: ENT;  Laterality: Bilateral;  . EXCISION CHONCHA BULLOSA Left 08/04/2017   Procedure: LEFT CONCHA BULLOSA RESECTION;  Surgeon: Newman Pies, MD;  Location: [facility];  Service: ENT;  Laterality: Left;  . FRONTAL SINUS EXPLORATION Bilateral 08/04/2017   Procedure: BILATERAL FRONTAL RECESS SINUS EXPLORATION;  Surgeon: Newman Pies, MD;  Location: [facility];  Service: ENT;  Laterality: Bilateral;  . LEFT HEART CATH AND CORONARY ANGIOGRAPHY N/A 10/16/2017   Procedure: LEFT HEART CATH AND CORONARY ANGIOGRAPHY;  Surgeon: Marykay Lex, MD;  Location: [facility] INVASIVE CV LAB;  Service: Cardiovascular;  Laterality: N/A;  . MAXILLARY ANTROSTOMY Bilateral 08/04/2017   Procedure: BILATERAL MAXILLARY ANTROSTOMY WITH TISSUE REMOVAL;  Surgeon: Newman Pies, MD;  Location: [facility];  Service: ENT;  Laterality: Bilateral;  . NASAL SEPTOPLASTY W/ TURBINOPLASTY N/A 08/04/2017   Procedure: NASAL SEPTOPLASTY WITH TURBINATE REDUCTION;  Surgeon: Suszanne Conners,  Su, MD;  Location: [facility];  Service: ENT;  Laterality: N/A;  . SINOSCOPY    . SINUS ENDO WITH FUSION Bilateral 08/04/2017   Procedure: SINUS ENDO WITH FUSION NAVIGATION;  Surgeon: Newman Pies, MD;  Location: [facility];  Service: ENT;  Laterality: Bilateral;  . SPHENOIDECTOMY Bilateral 08/04/2017   Procedure: BILATERAL SPHENOIDECTOMY WITH TISSUE REMOVAL;  Surgeon: Newman Pies, MD;   Location: [facility];  Service: ENT;  Laterality: Bilateral;        Home Medications    Prior to Admission medications   Medication Sig Start Date End Date Taking? Authorizing Provider  aspirin EC 81 MG tablet Take 81 mg by mouth daily.    [provider]  bismuth subsalicylate (PEPTO BISMOL) 262 MG/15ML suspension Take 30 mLs by mouth every 6 (six) hours as needed for indigestion.    [provider]  busPIRone (BUSPAR) 5 MG tablet Take 15 mg by mouth 2 (two) times daily.     [provider]  Carbinoxamine Maleate (RYVENT) 6 MG TABS Take 6 mg by mouth 2 (two) times daily. 03/04/18   Marcelyn Bruins, MD  cetirizine (ZYRTEC) 10 MG tablet Take 10 mg by mouth daily.    [provider]  dexlansoprazole (DEXILANT) 60 MG capsule Take 1 capsule (60 mg total) by mouth daily. 11/19/17   Marcelyn Bruins, MD  diclofenac sodium (VOLTAREN) 1 % GEL Apply 2-4 g topically daily as needed (for knee pain).  06/27/16   [provider]  Fluticasone Propionate (XHANCE) 93 MCG/ACT EXHU Place 2 sprays into both nostrils 2 (two) times daily. 03/04/18   Marcelyn Bruins, MD  ipratropium (ATROVENT) 0.03 % nasal spray Place 2 sprays into both nostrils 4 (four) times daily. 11/19/17   Marcelyn Bruins, MD  ketotifen (ZADITOR) 0.025 % ophthalmic solution Place 1 drop into both eyes 2 (two) times daily. 11/19/17   Marcelyn Bruins, MD  levalbuterol [facility] HFA) 45 MCG/ACT inhaler Inhale 2 puffs into the lungs every 6 (six) hours as needed for wheezing. 11/19/17   Marcelyn Bruins, MD  levocetirizine (XYZAL) 2.5 MG/5ML solution Take 2.5 mg by mouth every evening.    [provider]  meclizine (ANTIVERT) 25 MG tablet Take 25 mg by mouth 3 (three) times daily as needed for dizziness.    [provider]  metoprolol succinate (TOPROL XL) 50 MG 24 hr tablet Take 1 tablet (50 mg total) by mouth daily.  Take with or immediately following a meal. 04/27/17   Lennette Bihari, MD  montelukast (SINGULAIR) 10 MG tablet Take 1 tablet (10 mg total) by mouth at bedtime. 11/19/17   Marcelyn Bruins, MD  nicotine polacrilex (NICORETTE) 4 MG gum Take 1 each (4 mg total) by mouth as needed for smoking cessation. 10/19/17   Doreene Nest, NP  ondansetron (ZOFRAN ODT) 4 MG disintegrating tablet Take 1 tablet (4 mg total) by mouth every 8 (eight) hours as needed for nausea or vomiting. 01/14/17   Vanetta Mulders, MD  promethazine (PHENERGAN) 25 MG tablet Take 1 tablet (25 mg total) by mouth every 6 (six) hours as needed for nausea or vomiting. 01/14/17   Vanetta Mulders, MD  ranitidine (ZANTAC) 150 MG tablet Take 1 tablet (150 mg total) by mouth 2 (two) times daily. 11/19/17   Marcelyn Bruins, MD  ranitidine (ZANTAC) 300 MG capsule Take 300 mg by mouth every evening.    [provider]  simvastatin (  ZOCOR) 20 MG tablet Take 20 mg by mouth every evening.     [provider]  topiramate (TOPAMAX) 25 MG tablet Take 25 mg by mouth daily as needed (at onset of headaches).     [provider]  UNABLE TO FIND CPAP: At bedtime    [provider]    Family History Family History  Problem Relation Age of Onset  . Arthritis Mother   . Hyperlipidemia Mother   . Hypertension Mother   . Stroke Mother   . Diabetes Mother   . Stomach cancer Mother   . Hyperlipidemia Father   . Heart disease Father [id number]  . Hypertension Father     Social History Social History   Tobacco Use  . Smoking status: Never Smoker  . Smokeless tobacco: Current User    Types: Chew  . Tobacco comment: he does need a patch  Substance Use Topics  . Alcohol use: Yes    Comment: social  . Drug use: No     Allergies   Paxil [paroxetine]   Review of Systems Review of Systems  Constitutional: Negative for chills and fever.  HENT: Positive for ear pain, rhinorrhea and sore throat.     Eyes: Negative for visual disturbance.  Respiratory: Positive for cough and shortness of breath.   Cardiovascular: Positive for chest pain.  Gastrointestinal: Negative for abdominal pain, nausea and vomiting.  Neurological: Positive for dizziness and light-headedness. Negative for syncope, facial asymmetry, speech difficulty and weakness.     Physical Exam Updated Vital Signs BP (!) 155/93   Pulse 92   Resp 15   SpO2 100%   Physical Exam  Constitutional: He is oriented to person, place, and time. He appears well-developed and well-nourished.  HENT:  Head: Normocephalic and atraumatic.  Eyes: Conjunctivae and EOM are normal.  Neck: Neck supple.  Cardiovascular: Normal rate, regular rhythm and normal heart sounds.  No murmur heard. Pulmonary/Chest: Effort normal and breath sounds normal. No respiratory distress. He has no wheezes. He has no rales.  Abdominal: Soft. Bowel sounds are normal. He exhibits no distension. There is no tenderness.  Musculoskeletal: Normal range of motion. He exhibits no tenderness or deformity.  Neurological: He is alert and oriented to person, place, and time. He has normal strength. No cranial nerve deficit or sensory deficit.  Skin: Skin is warm and dry. No rash noted. No erythema.  Psychiatric: He has a normal mood and affect. His behavior is normal.  Nursing note and vitals reviewed.    ED Treatments / Results  Labs (all labs ordered are listed, but only abnormal results are displayed) Labs Reviewed  BASIC METABOLIC PANEL  CBC  CBG MONITORING, ED  I-STAT TROPONIN, ED    EKG EKG Interpretation  Date/Time:  Friday April 02 2018 10:22:58 EST Ventricular Rate:  92 PR Interval:    QRS Duration: 82 QT Interval:  335 QTC Calculation: 415 R Axis:   70 Text Interpretation:  Sinus rhythm Borderline T wave abnormalities No significant change since last tracing Confirmed by Jacalyn LefevreHaviland, Julie [phone]([id number]) on 04/02/2018 10:33:22 AM   Radiology No  results found.  Procedures Procedures (including critical care time)  Medications Ordered in ED Medications  aspirin chewable tablet 324 mg (has no administration in time range)     Initial Impression / Assessment and Plan / ED Course  I have reviewed the triage vital signs and the nursing notes.  Pertinent labs & imaging results that were available during my care of the  patient were reviewed by me and considered in my medical decision making (see chart for details).    1:10 PM Patient resting comfortably in bed, no acute distress.  Patient denies any complaints of any chest pain, dizziness.  Will repeat troponin.  1:56 PM Repeat troponin is negative.  Patient continues to deny any complaints at this time.  Heart score 2.  He has had 2- troponins and an EKG.  History and physical most consistent dissection, PE, pericarditis.   At this time there does not appear to be any evidence of an acute emergency medical condition and the patient appears stable for discharge with appropriate outpatient follow up.Diagnosis was discussed with patient who verbalizes understanding and is agreeable to discharge. Pt case discussed with Dr. Particia Nearing who agrees with my plan.   Final Clinical Impressions(s) / ED Diagnoses   Final diagnoses:  None    ED Discharge Orders    None       Rueben Bash 04/02/18 1628    Jacalyn Lefevre, MD 04/05/18 [phone]

## 2018-04-02 NOTE — ED Notes (Signed)
Patient transported to X-ray 

## 2018-04-02 NOTE — ED Notes (Signed)
Discharge instructions discussed with Pt. Pt verbalized understanding. Pt stable and ambulatory.    

## 2018-04-05 ENCOUNTER — Telehealth: Payer: Self-pay

## 2018-04-05 ENCOUNTER — Other Ambulatory Visit: Payer: Self-pay

## 2018-04-05 NOTE — Telephone Encounter (Signed)
Pt had some congestion, coughing, he was warm but not fever that he know sof. Pt believes he needs some prednisone to help clear it up. He is doing xhance and all other meds. [facility] [place].   [phone]

## 2018-04-06 NOTE — Telephone Encounter (Signed)
Called patient did not answer could not leave message.

## 2018-04-06 NOTE — Telephone Encounter (Signed)
Please see how he is doing today.  When did symptoms start with congestion and coughing?  Is he having any other symptoms?  Besides recommended medications has he tried anything OTC?

## 2018-04-07 ENCOUNTER — Ambulatory Visit: Payer: 59 | Admitting: Primary Care

## 2018-04-07 ENCOUNTER — Encounter: Payer: Self-pay | Admitting: Primary Care

## 2018-04-07 VITALS — BP 114/82 | HR 85 | Temp 98.5°F | Ht 69.0 in | Wt 200.0 lb

## 2018-04-07 DIAGNOSIS — I259 Chronic ischemic heart disease, unspecified: Secondary | ICD-10-CM

## 2018-04-07 DIAGNOSIS — J019 Acute sinusitis, unspecified: Secondary | ICD-10-CM

## 2018-04-07 DIAGNOSIS — I10 Essential (primary) hypertension: Secondary | ICD-10-CM | POA: Diagnosis not present

## 2018-04-07 DIAGNOSIS — J329 Chronic sinusitis, unspecified: Secondary | ICD-10-CM

## 2018-04-07 MED ORDER — BENZONATATE 200 MG PO CAPS: 200.0000 mg | ORAL_CAPSULE | Freq: Three times a day (TID) | ORAL | 0 refills | Status: DC | PRN

## 2018-04-07 MED ORDER — AMOXICILLIN-POT CLAVULANATE 875-125 MG PO TABS: 1.0000 | ORAL_TABLET | Freq: Two times a day (BID) | ORAL | 0 refills | Status: DC

## 2018-04-07 NOTE — Assessment & Plan Note (Signed)
Improved on lisinopril-hctz 10-12.5 mg (1/2 tablet) and metoprolol succinate. Continue this for now. Did provide caution for BP levels once he recovers from his acute sinusitis.   Discussed to call if he continues to see BP readings at or above 135/90.

## 2018-04-07 NOTE — Assessment & Plan Note (Signed)
Exam today consistent for acute sinusitis. Treat with Augmentin course today. Follow up PRN.

## 2018-04-07 NOTE — Telephone Encounter (Signed)
Called patient and did not receive an answer. Chart reviewed and shows he was seen today(04-07-18) by pcp and given antibiotics for sinus infection. I am completing message.

## 2018-04-07 NOTE — Progress Notes (Signed)
Subjective:    Patient ID: Ronald Burton, male    DOB: 1970-11-30, 47 y.o.   MRN: [id number]  HPI  Ronald Burton is a 47 year old male with a history of unstable angina, essential hypertension, OSA, GERD, tobacco abuse, hyperlipidemia who presents today for emergency department follow up.  He presented to [facility] on 04/02/18 with a chief complaint of dizziness and chest pain. Also endorsed URI symptoms of productive cough, ear pain, rhinorrhea for several days prior. He used a saline flush and Coricidin  felt better initially, but then felt dizzy with shortness of breath. He started to feel better upon arrival to the ED.   During his stay in the ED ECG showed NSR with rate of 92, borderline t-wave abnormalities without significant change. Both Troponin levels were negative and other labs unremarkable. Chest xray without acute cardiopulmonary disease. He was discharged home with recommendations for outpatient follow up.  Of note he's undergone cardiac catheterization in June 2019 which revealed normal coronary arteries, LVEF of 55-65%, and without mitral regurgitation or aortic stenosis. LV end diastolic pressure was mildly elevated. Echocardiogram in November 2018 with normal LV function with mild LVH.   Since his ED visit he denies chest pain but feels sick. He has had "cold symptoms" including sinus pressure, headaches, fatigue, dizziness, cough, sore throat which began 2 weeks ago. Overall he's feeling about the same. His cough is productive with yellow sputum. He's quite congested to the sinus area.   He stopped taking lisinopril-HCTZ 10-12.5 mg around June/July 2019 after his hospital stay as his BP was too low with both that and metoprolol succinate 50. Metoprolol succinate was initiated in June 2019.   Prior to his ED visit he hasn't been checking his BP. He resumed his lisinopril-HCTZ 10-12.5 mg (taking 1/2) over the last four days along with his metoprolol succinate 50 mg. His home BP is  running 130's/90's.    BP Readings from Last 3 Encounters:  04/07/18 114/82  04/02/18 (!) 150/98  03/04/18 (!) 112/92     Review of Systems  Constitutional: Positive for fatigue.  HENT: Positive for congestion, postnasal drip, sinus pressure and sore throat. Negative for ear pain.   Respiratory: Positive for cough and shortness of breath.   Cardiovascular: Negative for chest pain.       Past Medical History:  Diagnosis Date  . Anxiety   . Arthritis    knees  . BPH (benign prostatic hyperplasia)   . Complication of anesthesia    DIFFICULTY WAKING AFTER SINUS SURGERY  . Depression   . Deviated septum   . GERD (gastroesophageal reflux disease)   . Headache   . Hyperlipidemia   . Hypertension   . Irregular heart beat   . Migraines   . OSA (obstructive sleep apnea)    does not use CPAP  . Palpitations   . Seasonal allergies   . Tinnitus   . Urine incontinence      Social History   Socioeconomic History  . Marital status: Married    Spouse name: Not on file  . Number of children: Not on file  . Years of education: Not on file  . Highest education level: Not on file  Occupational History  . 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 Financial resource strain: Not on file  . Food insecurity:    Worry: Not on file    Inability: Not on file  . Transportation needs:    Medical: Not on  file    Non-medical: Not on file  Tobacco Use  . Smoking status: Never Smoker  . Smokeless tobacco: Current User    Types: Chew  . Tobacco comment: he does need a patch  Substance and Sexual Activity  . Alcohol use: Yes    Comment: social  . Drug use: No  . Sexual activity: Not on file  Lifestyle  . Physical activity:    Days per week: Not on file    Minutes per session: Not on file  . Stress: Not on file  Relationships  . Social connections:    Talks on phone: Not on file    Gets together: Not on file    Attends religious service: Not on file    Active member  of club or organization: Not on file    Attends meetings of clubs or organizations: Not on file    Relationship status: Not on file  . Intimate partner violence:    Fear of current or ex partner: Not on file    Emotionally abused: Not on file    Physically abused: Not on file    Forced sexual activity: Not on file  Other Topics Concern  . Not on file  Social History Narrative   Married.   6 children, 1 grandchild.   Works as a Art therapist   Enjoys spending time with family, video games.     Past Surgical History:  Procedure Laterality Date  . ETHMOIDECTOMY Bilateral 08/04/2017   Procedure: BILATERAL ETHMOIDECTOMY WITH TISSUE REMOVAL;  Surgeon: Newman Pies, MD;  Location: [facility];  Service: ENT;  Laterality: Bilateral;  . EXCISION CHONCHA BULLOSA Left 08/04/2017   Procedure: LEFT CONCHA BULLOSA RESECTION;  Surgeon: Newman Pies, MD;  Location: [facility];  Service: ENT;  Laterality: Left;  . FRONTAL SINUS EXPLORATION Bilateral 08/04/2017   Procedure: BILATERAL FRONTAL RECESS SINUS EXPLORATION;  Surgeon: Newman Pies, MD;  Location: [facility];  Service: ENT;  Laterality: Bilateral;  . LEFT HEART CATH AND CORONARY ANGIOGRAPHY N/A 10/16/2017   Procedure: LEFT HEART CATH AND CORONARY ANGIOGRAPHY;  Surgeon: Marykay Lex, MD;  Location: [facility] INVASIVE CV LAB;  Service: Cardiovascular;  Laterality: N/A;  . MAXILLARY ANTROSTOMY Bilateral 08/04/2017   Procedure: BILATERAL MAXILLARY ANTROSTOMY WITH TISSUE REMOVAL;  Surgeon: Newman Pies, MD;  Location: [facility];  Service: ENT;  Laterality: Bilateral;  . NASAL SEPTOPLASTY W/ TURBINOPLASTY N/A 08/04/2017   Procedure: NASAL SEPTOPLASTY WITH TURBINATE REDUCTION;  Surgeon: Newman Pies, MD;  Location: [facility];  Service: ENT;  Laterality: N/A;  . SINOSCOPY    . SINUS ENDO WITH FUSION Bilateral 08/04/2017   Procedure: SINUS ENDO WITH FUSION NAVIGATION;  Surgeon: Newman Pies, MD;  Location:  [facility];  Service: ENT;  Laterality: Bilateral;  . SPHENOIDECTOMY Bilateral 08/04/2017   Procedure: BILATERAL SPHENOIDECTOMY WITH TISSUE REMOVAL;  Surgeon: Newman Pies, MD;  Location: [facility];  Service: ENT;  Laterality: Bilateral;    Family History  Problem Relation Age of Onset  . Arthritis Mother   . Hyperlipidemia Mother   . Hypertension Mother   . Stroke Mother   . Diabetes Mother   . Stomach cancer Mother   . Hyperlipidemia Father   . Heart disease Father 56  . Hypertension Father     Allergies  Allergen Reactions  . Paxil [Paroxetine] Other (See Comments)    Induced panic    Current Outpatient Medications on File Prior  to Visit  Medication Sig Dispense Refill  . aspirin EC 81 MG tablet Take 81 mg by mouth daily at 12 noon.     . bismuth subsalicylate (PEPTO BISMOL) 262 MG/15ML suspension Take 30 mLs by mouth every 6 (six) hours as needed for indigestion.    . busPIRone (BUSPAR) 5 MG tablet Take 15 mg by mouth 2 (two) times daily.     . Carbinoxamine Maleate (RYVENT) 6 MG TABS Take 6 mg by mouth 2 (two) times daily. (Patient taking differently: Take 6 mg by mouth 2 (two) times daily as needed (allergies). ) 120 tablet 4  . cetirizine (ZYRTEC) 10 MG tablet Take 10 mg by mouth daily.    Marland Kitchen dexlansoprazole (DEXILANT) 60 MG capsule Take 1 capsule (60 mg total) by mouth daily. 90 capsule 2  . diclofenac sodium (VOLTAREN) 1 % GEL Apply 2-4 g topically daily as needed (for knee pain).     . Fluticasone Propionate (XHANCE) 93 MCG/ACT EXHU Place 2 sprays into both nostrils 2 (two) times daily. 16 mL 5  . ipratropium (ATROVENT) 0.03 % nasal spray Place 2 sprays into both nostrils 4 (four) times daily. 30 mL 5  . ketotifen (ZADITOR) 0.025 % ophthalmic solution Place 1 drop into both eyes 2 (two) times daily. 5 mL 5  . levalbuterol (XOPENEX HFA) 45 MCG/ACT inhaler Inhale 2 puffs into the lungs every 6 (six) hours as needed for wheezing. 1 Inhaler 2  .  levocetirizine (XYZAL) 2.5 MG/5ML solution Take 2.5 mg by mouth every evening.    . meclizine (ANTIVERT) 25 MG tablet Take 25 mg by mouth 3 (three) times daily as needed for dizziness.    . metoprolol succinate (TOPROL XL) 50 MG 24 hr tablet Take 1 tablet (50 mg total) by mouth daily. Take with or immediately following a meal. (Patient taking differently: Take 25 mg by mouth 2 (two) times daily. Take with or immediately following a meal.) 90 tablet 3  . montelukast (SINGULAIR) 10 MG tablet Take 1 tablet (10 mg total) by mouth at bedtime. 30 tablet 5  . nicotine polacrilex (NICORETTE) 4 MG gum Take 1 each (4 mg total) by mouth as needed for smoking cessation. 100 tablet 0  . ondansetron (ZOFRAN ODT) 4 MG disintegrating tablet Take 1 tablet (4 mg total) by mouth every 8 (eight) hours as needed for nausea or vomiting. 10 tablet 1  . promethazine (PHENERGAN) 25 MG tablet Take 1 tablet (25 mg total) by mouth every 6 (six) hours as needed for nausea or vomiting. 12 tablet 1  . ranitidine (ZANTAC) 150 MG tablet Take 1 tablet (150 mg total) by mouth 2 (two) times daily. 60 tablet 5  . simvastatin (ZOCOR) 20 MG tablet Take 20 mg by mouth every evening.     . topiramate (TOPAMAX) 25 MG tablet Take 25 mg by mouth daily as needed (at onset of headaches).     Marland Kitchen UNABLE TO FIND CPAP: At bedtime     No current facility-administered medications on file prior to visit.     BP 114/82   Pulse 85   Temp 98.5 F (36.9 C) (Oral)   Ht 5\' 9"  (1.753 m)   Wt 200 lb (90.7 kg)   SpO2 98%   BMI 29.53 kg/m    Objective:   Physical Exam  Constitutional: He appears well-nourished. He appears ill.  HENT:  Right Ear: Tympanic membrane and ear canal normal.  Left Ear: Tympanic membrane and ear canal normal.  Nose: Mucosal  edema present. Right sinus exhibits maxillary sinus tenderness and frontal sinus tenderness. Left sinus exhibits maxillary sinus tenderness and frontal sinus tenderness.  Mouth/Throat: Oropharynx is  clear and moist.  Neck: Neck supple.  Cardiovascular: Normal rate and regular rhythm.  No murmur heard. Respiratory: Effort normal and breath sounds normal. He has no wheezes.  Skin: Skin is warm and dry.           Assessment & Plan:

## 2018-04-07 NOTE — Patient Instructions (Addendum)
Start monitoring your blood pressure as discussed. Please notify me if you see readings at or above 135/90. Both numbers are too high.  Start Augmentin antibiotics for the infection Take 1 tablet by mouth twice daily for 10 days.  You may take Benzonatate capsules for cough. Take 1 capsule by mouth three times daily as needed for cough.  Make sure to drink plenty of water and rest.  It was a pleasure to see you today!

## 2018-04-07 NOTE — Assessment & Plan Note (Signed)
[facility] 04/02/18 for chest pain, dizziness, shortness of breath. Labs and imaging reviewed and were unremarkable. Suspect symptoms secondary to acute sinusitis/cough.   Treat today with Augmentin course. Will have him monitor BP at home.

## 2018-05-10 ENCOUNTER — Other Ambulatory Visit: Payer: Self-pay | Admitting: Cardiovascular Disease

## 2018-05-10 ENCOUNTER — Other Ambulatory Visit: Payer: Self-pay | Admitting: Allergy

## 2018-05-13 ENCOUNTER — Other Ambulatory Visit: Payer: Self-pay | Admitting: Allergy

## 2018-05-26 ENCOUNTER — Telehealth: Payer: Self-pay

## 2018-05-26 NOTE — Telephone Encounter (Signed)
PA initiated through [email or web address].  KeyShayne Alken - PA Case ID: [id number]

## 2018-05-27 ENCOUNTER — Encounter: Payer: Self-pay | Admitting: Primary Care

## 2018-05-27 ENCOUNTER — Ambulatory Visit: Payer: 59 | Admitting: Primary Care

## 2018-05-27 VITALS — BP 100/80 | HR 77 | Temp 98.1°F | Ht 69.0 in | Wt 198.0 lb

## 2018-05-27 DIAGNOSIS — K219 Gastro-esophageal reflux disease without esophagitis: Secondary | ICD-10-CM | POA: Diagnosis not present

## 2018-05-27 DIAGNOSIS — I10 Essential (primary) hypertension: Secondary | ICD-10-CM | POA: Diagnosis not present

## 2018-05-27 MED ORDER — HYDROCHLOROTHIAZIDE 12.5 MG PO TABS: 12.5000 mg | ORAL_TABLET | Freq: Every day | ORAL | 0 refills | Status: DC

## 2018-05-27 NOTE — Progress Notes (Signed)
Subjective:    Patient ID: Ronald Burton, male    DOB: 28-Dec-1970, 48 y.o.   MRN: [id number]  HPI  Mr. Bellissimo is a 48 year old male with a history of hypertension, unstable angina, OSA, Tobacco abuse, hyperlipidemia, prediabetes who presents today with a chief complaint of elevated blood pressure readings.  He is currently managed on metoprolol succinate 50 mg. Previously managed on lisinopril-HCTZ 10-12.5 mg for which he resumed 4 days ago.  Four days ago when getting off of work he was feeling bad. Symptoms include esophageal reflux and palpitations. He checked his BP which was 178/98. He resumed his lisinopril-HCTZ and has been tracking his BP since.   He is checking his BP three times daily and is ranging: 144/93, 131/89, 113/76.  He did check his BP prior to his incident which was ranging 131/69, 140/89, 143/95  BP Readings from Last 3 Encounters:  05/27/18 100/80  04/07/18 114/82  04/02/18 (!) 150/98   After he resumed his lisinopril-HCTZ he's noticed less headaches, less nausea, less dizziness. He is worried that his BP is getting too low with the addition of the medication. His GERD is still bothersome, is not taking his Dexilant consistently as prescribed.   Review of Systems  Eyes: Negative for visual disturbance.  Respiratory: Negative for shortness of breath.   Cardiovascular: Negative for chest pain and leg swelling.  Neurological: Positive for dizziness and headaches.       See HPI       Past Medical History:  Diagnosis Date  . Anxiety   . Arthritis    knees  . BPH (benign prostatic hyperplasia)   . Complication of anesthesia    DIFFICULTY WAKING AFTER SINUS SURGERY  . Depression   . Deviated septum   . GERD (gastroesophageal reflux disease)   . Headache   . Hyperlipidemia   . Hypertension   . Irregular heart beat   . Migraines   . OSA (obstructive sleep apnea)    does not use CPAP  . Palpitations   . Seasonal allergies   . Tinnitus   . Urine  incontinence      Social History   Socioeconomic History  . Marital status: Married    Spouse name: Not on file  . Number of children: Not on file  . Years of education: Not on file  . Highest education level: Not on file  Occupational History  . 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 Financial resource strain: Not on file  . Food insecurity:    Worry: Not on file    Inability: Not on file  . Transportation needs:    Medical: Not on file    Non-medical: Not on file  Tobacco Use  . Smoking status: Never Smoker  . Smokeless tobacco: Current User    Types: Chew  . Tobacco comment: he does need a patch  Substance and Sexual Activity  . Alcohol use: Yes    Comment: social  . Drug use: No  . Sexual activity: Not on file  Lifestyle  . Physical activity:    Days per week: Not on file    Minutes per session: Not on file  . Stress: Not on file  Relationships  . Social connections:    Talks on phone: Not on file    Gets together: Not on file    Attends religious service: Not on file    Active member of club or organization: Not on file  Attends meetings of clubs or organizations: Not on file    Relationship status: Not on file  . Intimate partner violence:    Fear of current or ex partner: Not on file    Emotionally abused: Not on file    Physically abused: Not on file    Forced sexual activity: Not on file  Other Topics Concern  . Not on file  Social History Narrative   Married.   6 children, 1 grandchild.   Works as a Art therapist   Enjoys spending time with family, video games.     Past Surgical History:  Procedure Laterality Date  . ETHMOIDECTOMY Bilateral 08/04/2017   Procedure: BILATERAL ETHMOIDECTOMY WITH TISSUE REMOVAL;  Surgeon: Newman Pies, MD;  Location: [facility];  Service: ENT;  Laterality: Bilateral;  . EXCISION CHONCHA BULLOSA Left 08/04/2017   Procedure: LEFT CONCHA BULLOSA RESECTION;  Surgeon: Newman Pies, MD;  Location:  [facility];  Service: ENT;  Laterality: Left;  . FRONTAL SINUS EXPLORATION Bilateral 08/04/2017   Procedure: BILATERAL FRONTAL RECESS SINUS EXPLORATION;  Surgeon: Newman Pies, MD;  Location: [facility];  Service: ENT;  Laterality: Bilateral;  . LEFT HEART CATH AND CORONARY ANGIOGRAPHY N/A 10/16/2017   Procedure: LEFT HEART CATH AND CORONARY ANGIOGRAPHY;  Surgeon: Marykay Lex, MD;  Location: [facility] INVASIVE CV LAB;  Service: Cardiovascular;  Laterality: N/A;  . MAXILLARY ANTROSTOMY Bilateral 08/04/2017   Procedure: BILATERAL MAXILLARY ANTROSTOMY WITH TISSUE REMOVAL;  Surgeon: Newman Pies, MD;  Location: [facility];  Service: ENT;  Laterality: Bilateral;  . NASAL SEPTOPLASTY W/ TURBINOPLASTY N/A 08/04/2017   Procedure: NASAL SEPTOPLASTY WITH TURBINATE REDUCTION;  Surgeon: Newman Pies, MD;  Location: [facility];  Service: ENT;  Laterality: N/A;  . SINOSCOPY    . SINUS ENDO WITH FUSION Bilateral 08/04/2017   Procedure: SINUS ENDO WITH FUSION NAVIGATION;  Surgeon: Newman Pies, MD;  Location: [facility];  Service: ENT;  Laterality: Bilateral;  . SPHENOIDECTOMY Bilateral 08/04/2017   Procedure: BILATERAL SPHENOIDECTOMY WITH TISSUE REMOVAL;  Surgeon: Newman Pies, MD;  Location: [facility];  Service: ENT;  Laterality: Bilateral;    Family History  Problem Relation Age of Onset  . Arthritis Mother   . Hyperlipidemia Mother   . Hypertension Mother   . Stroke Mother   . Diabetes Mother   . Stomach cancer Mother   . Hyperlipidemia Father   . Heart disease Father 65  . Hypertension Father     Allergies  Allergen Reactions  . Paxil [Paroxetine] Other (See Comments)    Induced panic    Current Outpatient Medications on File Prior to Visit  Medication Sig Dispense Refill  . aspirin EC 81 MG tablet Take 81 mg by mouth daily at 12 noon.     . bismuth subsalicylate (PEPTO BISMOL) 262 MG/15ML suspension Take 30 mLs by mouth every 6  (six) hours as needed for indigestion.    . busPIRone (BUSPAR) 5 MG tablet Take 15 mg by mouth 2 (two) times daily.     . Carbinoxamine Maleate (RYVENT) 6 MG TABS Take 6 mg by mouth 2 (two) times daily. (Patient taking differently: Take 6 mg by mouth 2 (two) times daily as needed (allergies). ) 120 tablet 4  . cetirizine (ZYRTEC) 10 MG tablet Take 10 mg by mouth daily.    Marland Kitchen dexlansoprazole (DEXILANT) 60 MG capsule Take 1 capsule (60 mg total) by mouth daily. 90 capsule 2  .  diclofenac sodium (VOLTAREN) 1 % GEL Apply 2-4 g topically daily as needed (for knee pain).     . Fluticasone Propionate (XHANCE) 93 MCG/ACT EXHU Place 2 sprays into both nostrils 2 (two) times daily. 16 mL 5  . ipratropium (ATROVENT) 0.03 % nasal spray Place 2 sprays into both nostrils 4 (four) times daily. 30 mL 5  . ketotifen (ZADITOR) 0.025 % ophthalmic solution Place 1 drop into both eyes 2 (two) times daily. 5 mL 5  . levalbuterol (XOPENEX HFA) 45 MCG/ACT inhaler TAKE 2 PUFFS BY MOUTH EVERY 6 HOURS AS NEEDED FOR WHEEZE 15 Inhaler 2  . levocetirizine (XYZAL) 2.5 MG/5ML solution Take 2.5 mg by mouth every evening.    Marland Kitchen. lisinopril-hydrochlorothiazide (PRINZIDE,ZESTORETIC) 10-12.5 MG tablet Take 1 tablet by mouth daily.    . meclizine (ANTIVERT) 25 MG tablet Take 25 mg by mouth 3 (three) times daily as needed for dizziness.    . metoprolol succinate (TOPROL-XL) 50 MG 24 hr tablet TAKE 1 TABLET (50 MG TOTAL) BY MOUTH DAILY. TAKE WITH OR IMMEDIATELY FOLLOWING A MEAL. 30 tablet 8  . montelukast (SINGULAIR) 10 MG tablet TAKE 1 TABLET BY MOUTH EVERYDAY AT BEDTIME 30 tablet 3  . nicotine polacrilex (NICORETTE) 4 MG gum Take 1 each (4 mg total) by mouth as needed for smoking cessation. 100 tablet 0  . ondansetron (ZOFRAN ODT) 4 MG disintegrating tablet Take 1 tablet (4 mg total) by mouth every 8 (eight) hours as needed for nausea or vomiting. 10 tablet 1  . promethazine (PHENERGAN) 25 MG tablet Take 1 tablet (25 mg total) by mouth  every 6 (six) hours as needed for nausea or vomiting. 12 tablet 1  . ranitidine (ZANTAC) 150 MG tablet Take 1 tablet (150 mg total) by mouth 2 (two) times daily. 60 tablet 5  . simvastatin (ZOCOR) 20 MG tablet Take 20 mg by mouth every evening.     . topiramate (TOPAMAX) 25 MG tablet Take 25 mg by mouth daily as needed (at onset of headaches).     Marland Kitchen. UNABLE TO FIND CPAP: At bedtime     No current facility-administered medications on file prior to visit.     BP 100/80 (BP Location: Left Arm, Patient Position: Sitting, Cuff Size: Large)   Pulse 77   Temp 98.1 F (36.7 C) (Oral)   Ht 5\' 9"  (1.753 m)   Wt 198 lb (89.8 kg)   SpO2 98%   BMI 29.24 kg/m    Objective:   Physical Exam  Constitutional: He appears well-nourished.  Neck: Neck supple.  Cardiovascular: Normal rate and regular rhythm.  Respiratory: Effort normal and breath sounds normal.  Skin: Skin is warm and dry.  Psychiatric: He has a normal mood and affect.           Assessment & Plan:

## 2018-05-27 NOTE — Assessment & Plan Note (Signed)
Borderline too low today, suspect he is on too much medication.  Discussed to avoid making medication changes without my knowledge.  Continue metoprolol succinate 50 mg, stop lisinopril-HCTZ 10-12.5 mg. Start HCTZ 12.5 mg. He will monitor BP and send [place] readings in two weeks.

## 2018-05-27 NOTE — Assessment & Plan Note (Signed)
Discussed to take Dexilant consistently as prescribed. Can add on Pepcid once daily if needed. He will update.

## 2018-05-27 NOTE — Patient Instructions (Signed)
Continue metoprolol succinate for blood pressure and heart rate.  Start hydrochlorothiazide 12.5 mg for blood pressure.  Stop lisinopril-hydrochlorothiazide 10-12.5 mg for blood pressure.  Continue to monitor your blood pressure and send me some readings in 2 weeks.  It was a pleasure to see you today!

## 2018-05-31 ENCOUNTER — Telehealth: Payer: Self-pay

## 2018-05-31 ENCOUNTER — Encounter (HOSPITAL_COMMUNITY): Payer: Self-pay | Admitting: Emergency Medicine

## 2018-05-31 ENCOUNTER — Other Ambulatory Visit: Payer: Self-pay

## 2018-05-31 ENCOUNTER — Ambulatory Visit (HOSPITAL_COMMUNITY)
Admission: EM | Admit: 2018-05-31 | Discharge: 2018-05-31 | Disposition: A | Discharge status: Home / Self Care | Payer: 59 | Attending: Family Medicine | Admitting: Family Medicine

## 2018-05-31 DIAGNOSIS — I10 Essential (primary) hypertension: Secondary | ICD-10-CM

## 2018-05-31 NOTE — ED Triage Notes (Addendum)
Patient has been monitoring blood pressure.   Started a blood pressure medicine on Wednesday.   Patient reports chest pain with blood pressure 157/111, burning, hot.  Intermittent.    Left arm numbness, intermittent.   Complains of fast heart rate-associated with blood pressure elevation  Reports blood pressure spikes associated with facial numbness for 2 weeks

## 2018-05-31 NOTE — Telephone Encounter (Signed)
PA approved through covermymeds.  Request Reference Number: [id number]. XHANCE MIS is approved through 05/27/2019.

## 2018-05-31 NOTE — Telephone Encounter (Signed)
Pt said on 05/30/18 pt woke up 120/82. At 1 PM on 05/30/18 pt felt weak,nausea, dizzy, feet felt cold; BP 157/111. Pt rested and 05/30/18 at 9PM BP 127/86. 05/31/18 at 5 AM BP 108/76. Now BP 144/89; pts jaw feels numb, lt arm tingling, weak,dizzy, nausea, dry mouth and chest burning. Pt had CP last night. Pt to go to [facility] now. Pt does not want to go to ED. FYI to Dr Emerson Monte NP out of office today.

## 2018-05-31 NOTE — Telephone Encounter (Signed)
Please call to check on patient. I see he went to the ED. How's he feeling? How has his BP been running since switching his metoprolol to bedtime?

## 2018-05-31 NOTE — ED Notes (Signed)
Spoke to dr hagler about patient complaints.  Placed in room 7, jessica branch RN going to do ekg

## 2018-05-31 NOTE — ED Provider Notes (Signed)
[facility]    CSN: [id number] Arrival date & time: 05/31/18  1236     History   Chief Complaint Chief Complaint  Patient presents with  . Hypertension    HPI Ronald Burton is a 48 y.o. male.   Patient takes hydrochlorothiazide and metoprolol for blood pressure.  There is been some recent adjustments.  Pressures have had some elevation and he has other symptoms including palpitations and anxiety that are related.  Has not taken blood pressure regularly at 24-hour intervals either.  HPI  Past Medical History:  Diagnosis Date  . Anxiety   . Arthritis    knees  . BPH (benign prostatic hyperplasia)   . Complication of anesthesia    DIFFICULTY WAKING AFTER SINUS SURGERY  . Depression   . Deviated septum   . GERD (gastroesophageal reflux disease)   . Headache   . Hyperlipidemia   . Hypertension   . Irregular heart beat   . Migraines   . OSA (obstructive sleep apnea)    does not use CPAP  . Palpitations   . Seasonal allergies   . Tinnitus   . Urine incontinence     Patient Active Problem List   Diagnosis Date Noted  . Prediabetes 01/20/2018  . Chronic pain of both knees 10/19/2017  . Unstable angina (HCC)   . Chest pain 10/15/2017  . OSA (obstructive sleep apnea) 10/15/2017  . Tobacco dependence 10/15/2017  . Recurrent sinusitis 06/11/2017  . Allergic rhinitis 12/24/2016  . Hyperlipidemia 12/24/2016  . Preventative health care 12/24/2016  . Essential hypertension 02/27/2016  . BPH (benign prostatic hyperplasia) 02/27/2016  . Arthralgia 02/27/2016  . GERD (gastroesophageal reflux disease) 02/27/2016    Past Surgical History:  Procedure Laterality Date  . ETHMOIDECTOMY Bilateral 08/04/2017   Procedure: BILATERAL ETHMOIDECTOMY WITH TISSUE REMOVAL;  Surgeon: Newman Pies, MD;  Location: [facility];  Service: ENT;  Laterality: Bilateral;  . EXCISION CHONCHA BULLOSA Left 08/04/2017   Procedure: LEFT CONCHA BULLOSA RESECTION;  Surgeon: Newman Pies, MD;  Location: [facility];  Service: ENT;  Laterality: Left;  . FRONTAL SINUS EXPLORATION Bilateral 08/04/2017   Procedure: BILATERAL FRONTAL RECESS SINUS EXPLORATION;  Surgeon: Newman Pies, MD;  Location: [facility];  Service: ENT;  Laterality: Bilateral;  . LEFT HEART CATH AND CORONARY ANGIOGRAPHY N/A 10/16/2017   Procedure: LEFT HEART CATH AND CORONARY ANGIOGRAPHY;  Surgeon: Marykay Lex, MD;  Location: [facility] INVASIVE CV LAB;  Service: Cardiovascular;  Laterality: N/A;  . MAXILLARY ANTROSTOMY Bilateral 08/04/2017   Procedure: BILATERAL MAXILLARY ANTROSTOMY WITH TISSUE REMOVAL;  Surgeon: Newman Pies, MD;  Location: [facility];  Service: ENT;  Laterality: Bilateral;  . NASAL SEPTOPLASTY W/ TURBINOPLASTY N/A 08/04/2017   Procedure: NASAL SEPTOPLASTY WITH TURBINATE REDUCTION;  Surgeon: Newman Pies, MD;  Location: [facility];  Service: ENT;  Laterality: N/A;  . SINOSCOPY    . SINUS ENDO WITH FUSION Bilateral 08/04/2017   Procedure: SINUS ENDO WITH FUSION NAVIGATION;  Surgeon: Newman Pies, MD;  Location: [facility];  Service: ENT;  Laterality: Bilateral;  . SPHENOIDECTOMY Bilateral 08/04/2017   Procedure: BILATERAL SPHENOIDECTOMY WITH TISSUE REMOVAL;  Surgeon: Newman Pies, MD;  Location: [facility];  Service: ENT;  Laterality: Bilateral;       Home Medications    Prior to Admission medications   Medication Sig Start Date End Date Taking? Authorizing Provider  aspirin EC 81 MG tablet Take 81 mg by mouth  daily at 12 noon.    Yes [provider]  busPIRone (BUSPAR) 5 MG tablet Take 15 mg by mouth 2 (two) times daily.    Yes [provider]  cetirizine (ZYRTEC) 10 MG tablet Take 10 mg by mouth daily.   Yes [provider]  dexlansoprazole (DEXILANT) 60 MG capsule Take 1 capsule (60 mg total) by mouth daily. 11/19/17  Yes Padgett, Pilar Grammes, MD  hydrochlorothiazide (HYDRODIURIL) 12.5 MG tablet  Take 1 tablet (12.5 mg total) by mouth daily. For blood pressure. 05/27/18  Yes Doreene Nest, NP  ipratropium (ATROVENT) 0.03 % nasal spray Place 2 sprays into both nostrils 4 (four) times daily. 11/19/17  Yes Padgett, Pilar Grammes, MD  ketotifen (ZADITOR) 0.025 % ophthalmic solution Place 1 drop into both eyes 2 (two) times daily. 11/19/17  Yes Padgett, Pilar Grammes, MD  levocetirizine Elita Boone) 2.5 MG/5ML solution Take 2.5 mg by mouth every evening.   Yes [provider]  metoprolol succinate (TOPROL-XL) 50 MG 24 hr tablet TAKE 1 TABLET (50 MG TOTAL) BY MOUTH DAILY. TAKE WITH OR IMMEDIATELY FOLLOWING A MEAL. 05/10/18  Yes Lennette Bihari, MD  montelukast (SINGULAIR) 10 MG tablet TAKE 1 TABLET BY MOUTH EVERYDAY AT BEDTIME 05/10/18  Yes Padgett, Pilar Grammes, MD  simvastatin (ZOCOR) 20 MG tablet Take 20 mg by mouth every evening.    Yes [provider]  bismuth subsalicylate (PEPTO BISMOL) 262 MG/15ML suspension Take 30 mLs by mouth every 6 (six) hours as needed for indigestion.    [provider]  Carbinoxamine Maleate (RYVENT) 6 MG TABS Take 6 mg by mouth 2 (two) times daily. Patient taking differently: Take 6 mg by mouth 2 (two) times daily as needed (allergies).  03/04/18   Marcelyn Bruins, MD  diclofenac sodium (VOLTAREN) 1 % GEL Apply 2-4 g topically daily as needed (for knee pain).  06/27/16   [provider]  Fluticasone Propionate (XHANCE) 93 MCG/ACT EXHU Place 2 sprays into both nostrils 2 (two) times daily. 03/04/18   Marcelyn Bruins, MD  levalbuterol [facility] HFA) 45 MCG/ACT inhaler TAKE 2 PUFFS BY MOUTH EVERY 6 HOURS AS NEEDED FOR WHEEZE 05/14/18   Padgett, Pilar Grammes, MD  meclizine (ANTIVERT) 25 MG tablet Take 25 mg by mouth 3 (three) times daily as needed for dizziness.    [provider]  nicotine polacrilex (NICORETTE) 4 MG gum Take 1 each (4 mg total) by mouth as needed for smoking cessation. 10/19/17    Doreene Nest, NP  ondansetron (ZOFRAN ODT) 4 MG disintegrating tablet Take 1 tablet (4 mg total) by mouth every 8 (eight) hours as needed for nausea or vomiting. 01/14/17   Vanetta Mulders, MD  promethazine (PHENERGAN) 25 MG tablet Take 1 tablet (25 mg total) by mouth every 6 (six) hours as needed for nausea or vomiting. 01/14/17   Vanetta Mulders, MD  ranitidine (ZANTAC) 150 MG tablet Take 1 tablet (150 mg total) by mouth 2 (two) times daily. 11/19/17   Marcelyn Bruins, MD  topiramate (TOPAMAX) 25 MG tablet Take 25 mg by mouth daily as needed (at onset of headaches).     [provider]  UNABLE TO FIND CPAP: At bedtime    [provider]    Family History Family History  Problem Relation Age of Onset  . Arthritis Mother   . Hyperlipidemia Mother   . Hypertension Mother   . Stroke Mother   . Diabetes Mother   . Stomach cancer Mother   .  Hyperlipidemia Father   . Heart disease Father [id number]  . Hypertension Father     Social History Social History   Tobacco Use  . Smoking status: Never Smoker  . Smokeless tobacco: Current User    Types: Chew  . Tobacco comment: he does need a patch  Substance Use Topics  . Alcohol use: Yes    Comment: social  . Drug use: No     Allergies   Paxil [paroxetine]   Review of Systems Review of Systems  Cardiovascular: Positive for chest pain and palpitations.  All other systems reviewed and are negative.    Physical Exam Triage Vital Signs ED Triage Vitals  Enc Vitals Group     BP 05/31/18 1405 (!) 143/88     Pulse Rate 05/31/18 1405 87     Resp 05/31/18 1405 18     Temp 05/31/18 1405 98.8 F (37.1 C)     Temp Source 05/31/18 1405 Oral     SpO2 05/31/18 1405 100 %     Weight --      Height --      Head Circumference --      Peak Flow --      Pain Score 05/31/18 1401 6     Pain Loc --      Pain Edu? --      Excl. in GC? --    No data found.  Updated Vital Signs BP (!) 143/88 (BP Location: Right  Arm)   Pulse 87   Temp 98.8 F (37.1 C) (Oral)   Resp 18   SpO2 100%   Visual Acuity Right Eye Distance:   Left Eye Distance:   Bilateral Distance:    Right Eye Near:   Left Eye Near:    Bilateral Near:     Physical Exam Constitutional:      Appearance: Normal appearance.  HENT:     Mouth/Throat:     Mouth: Mucous membranes are moist.     Pharynx: Oropharynx is clear.  Cardiovascular:     Rate and Rhythm: Normal rate and regular rhythm.     Pulses: Normal pulses.     Heart sounds: Normal heart sounds.  Pulmonary:     Effort: Pulmonary effort is normal.     Breath sounds: Normal breath sounds.  Neurological:     General: No focal deficit present.     Mental Status: He is alert and oriented to person, place, and time.      UC Treatments / Results  Labs (all labs ordered are listed, but only abnormal results are displayed) Labs Reviewed - No data to display  EKG None  Radiology No results found.  Procedures Procedures (including critical care time)  Medications Ordered in UC Medications - No data to display  Initial Impression / Assessment and Plan / UC Course  I have reviewed the triage vital signs and the nursing notes.  Pertinent labs & imaging results that were available during my care of the patient were reviewed by me and considered in my medical decision making (see chart for details).     Hypertension.  Needs better control.  I suggested that he take metoprolol at bedtime to get better a.m. coverage which is usually when blood pressures tend to be higher.  Continue hydrochlorothiazide in the morning. Final Clinical Impressions(s) / UC Diagnoses   Final diagnoses:  None   Discharge Instructions   None    ED Prescriptions    None  Controlled Substance Prescriptions [place] Controlled Substance Registry consulted? No   Frederica KusterMiller,  M, MD 05/31/18 1447

## 2018-06-01 NOTE — Telephone Encounter (Addendum)
Tried to call patient but could not leave message since voicemail box is not set up. 

## 2018-06-02 NOTE — Telephone Encounter (Signed)
Tried to call patient but could not leave message since voicemail box is not set up. 

## 2018-06-05 ENCOUNTER — Emergency Department: Payer: 59

## 2018-06-05 ENCOUNTER — Other Ambulatory Visit: Payer: Self-pay

## 2018-06-05 ENCOUNTER — Emergency Department
Admission: EM | Admit: 2018-06-05 | Discharge: 2018-06-06 | Disposition: A | Discharge status: Home / Self Care | Payer: 59 | Attending: Emergency Medicine | Admitting: Emergency Medicine

## 2018-06-05 DIAGNOSIS — Z79899 Other long term (current) drug therapy: Secondary | ICD-10-CM | POA: Diagnosis not present

## 2018-06-05 DIAGNOSIS — Z7982 Long term (current) use of aspirin: Secondary | ICD-10-CM | POA: Diagnosis not present

## 2018-06-05 DIAGNOSIS — E0590 Thyrotoxicosis, unspecified without thyrotoxic crisis or storm: Secondary | ICD-10-CM | POA: Insufficient documentation

## 2018-06-05 DIAGNOSIS — F17220 Nicotine dependence, chewing tobacco, uncomplicated: Secondary | ICD-10-CM | POA: Diagnosis not present

## 2018-06-05 DIAGNOSIS — R079 Chest pain, unspecified: Secondary | ICD-10-CM | POA: Diagnosis not present

## 2018-06-05 DIAGNOSIS — I10 Essential (primary) hypertension: Secondary | ICD-10-CM | POA: Insufficient documentation

## 2018-06-05 DIAGNOSIS — J101 Influenza due to other identified influenza virus with other respiratory manifestations: Secondary | ICD-10-CM | POA: Insufficient documentation

## 2018-06-05 DIAGNOSIS — R55 Syncope and collapse: Secondary | ICD-10-CM | POA: Diagnosis not present

## 2018-06-05 LAB — BASIC METABOLIC PANEL
Anion gap: 10 (ref 5–15)
BUN: 9 mg/dL (ref 6–20)
CALCIUM: 9.6 mg/dL (ref 8.9–10.3)
CO2: 24 mmol/L (ref 22–32)
Chloride: 100 mmol/L (ref 98–111)
Creatinine, Ser: 1.1 mg/dL (ref 0.61–1.24)
GFR calc Af Amer: >60 mL/min (ref >60)
Glucose, Bld: 139 mg/dL — ABNORMAL HIGH (ref 70–99)
Potassium: 3.2 mmol/L — ABNORMAL LOW (ref 3.5–5.1)
Sodium: 134 mmol/L — ABNORMAL LOW (ref 135–145)

## 2018-06-05 LAB — CBC
HCT: 46.6 % (ref 39.0–52.0)
Hemoglobin: 15.8 g/dL (ref 13.0–17.0)
MCH: 27.3 pg (ref 26.0–34.0)
MCHC: 33.9 g/dL (ref 30.0–36.0)
MCV: 80.6 fL (ref 80.0–100.0)
Platelets: 269 10*3/uL (ref 150–400)
RBC: 5.78 MIL/uL (ref 4.22–5.81)
RDW: 13.1 % (ref 11.5–15.5)
WBC: 12.1 10*3/uL — ABNORMAL HIGH (ref 4.0–10.5)
nRBC: 0 % (ref 0.0–0.2)

## 2018-06-05 LAB — TROPONIN I: Troponin I: <0.03 ng/mL (ref <0.03)

## 2018-06-05 MED ORDER — MORPHINE SULFATE (PF) 2 MG/ML IV SOLN
2.0000 mg | Freq: Once | INTRAVENOUS | Status: AC
  Administered 2018-06-06: 2 mg via INTRAVENOUS
  Filled 2018-06-05: qty 1

## 2018-06-05 MED ORDER — SODIUM CHLORIDE 0.9% FLUSH
3.0000 mL | Freq: Once | INTRAVENOUS | Status: AC
  Administered 2018-06-05: 3 mL via INTRAVENOUS

## 2018-06-05 MED ORDER — ONDANSETRON HCL 4 MG/2ML IJ SOLN
4.0000 mg | Freq: Once | INTRAMUSCULAR | Status: AC
  Administered 2018-06-06: 4 mg via INTRAVENOUS
  Filled 2018-06-05: qty 2

## 2018-06-05 NOTE — ED Triage Notes (Signed)
Patient reports having chest pain off/on for 1 week.  States eating makes pain worse and some of his medications make it worse.

## 2018-06-05 NOTE — ED Provider Notes (Signed)
[facility] Emergency Department Provider Note _________   First MD Initiated Contact with Patient 06/05/18 2331     (approximate)  I have reviewed the triage vital signs and the nursing notes.   HISTORY  Chief Complaint Chest Pain   HPI Ronald Burton is a 48 y.o. male with below list of chronic medical conditions including hypertension migraine headaches hyperlipidemia presents to the emergency department with intermittent chest pain for "a while but with acute worsening over the past week.  Patient also admits to rapid heartbeat today.  In addition patient admits to "heavy" breathing with upper and lower extremity numbness and tingling followed by losing consciousness tonight.  Patient's significant other at bedside states when she arrived at the house EMS was arousing the patient with pneumonia.  Past Medical History:  Diagnosis Date  . Anxiety   . Arthritis    knees  . BPH (benign prostatic hyperplasia)   . Complication of anesthesia    DIFFICULTY WAKING AFTER SINUS SURGERY  . Depression   . Deviated septum   . GERD (gastroesophageal reflux disease)   . Headache   . Hyperlipidemia   . Hypertension   . Irregular heart beat   . Migraines   . OSA (obstructive sleep apnea)    does not use CPAP  . Palpitations   . Seasonal allergies   . Tinnitus   . Urine incontinence     Patient Active Problem List   Diagnosis Date Noted  . Prediabetes 01/20/2018  . Chronic pain of both knees 10/19/2017  . Unstable angina (HCC)   . Chest pain 10/15/2017  . OSA (obstructive sleep apnea) 10/15/2017  . Tobacco dependence 10/15/2017  . Recurrent sinusitis 06/11/2017  . Allergic rhinitis 12/24/2016  . Hyperlipidemia 12/24/2016  . Preventative health care 12/24/2016  . Essential hypertension 02/27/2016  . BPH (benign prostatic hyperplasia) 02/27/2016  . Arthralgia 02/27/2016  . GERD (gastroesophageal reflux disease) 02/27/2016    Past Surgical History:    Procedure Laterality Date  . ETHMOIDECTOMY Bilateral 08/04/2017   Procedure: BILATERAL ETHMOIDECTOMY WITH TISSUE REMOVAL;  Surgeon: Newman Pieseoh, Su, MD;  Location: [facility];  Service: ENT;  Laterality: Bilateral;  . EXCISION CHONCHA BULLOSA Left 08/04/2017   Procedure: LEFT CONCHA BULLOSA RESECTION;  Surgeon: Newman Pieseoh, Su, MD;  Location: [facility];  Service: ENT;  Laterality: Left;  . FRONTAL SINUS EXPLORATION Bilateral 08/04/2017   Procedure: BILATERAL FRONTAL RECESS SINUS EXPLORATION;  Surgeon: Newman Pieseoh, Su, MD;  Location: [facility];  Service: ENT;  Laterality: Bilateral;  . LEFT HEART CATH AND CORONARY ANGIOGRAPHY N/A 10/16/2017   Procedure: LEFT HEART CATH AND CORONARY ANGIOGRAPHY;  Surgeon: Marykay LexHarding, David W, MD;  [facility];  Service: Cardiovascular;  Laterality: N/A;  . MAXILLARY ANTROSTOMY Bilateral 08/04/2017   Procedure: BILATERAL MAXILLARY ANTROSTOMY WITH TISSUE REMOVAL;  Surgeon: Newman Pieseoh, Su, MD;  Location: [facility];  Service: ENT;  Laterality: Bilateral;  . NASAL SEPTOPLASTY W/ TURBINOPLASTY N/A 08/04/2017   Procedure: NASAL SEPTOPLASTY WITH TURBINATE REDUCTION;  Surgeon: Newman Pieseoh, Su, MD;  Location: [facility];  Service: ENT;  Laterality: N/A;  . SINOSCOPY    . SINUS ENDO WITH FUSION Bilateral 08/04/2017   Procedure: SINUS ENDO WITH FUSION NAVIGATION;  Surgeon: Newman Pieseoh, Su, MD;  Location: [facility];  Service: ENT;  Laterality: Bilateral;  . SPHENOIDECTOMY Bilateral 08/04/2017   Procedure: BILATERAL SPHENOIDECTOMY WITH TISSUE REMOVAL;  Surgeon: Newman Pieseoh, Su, MD;  Location: [facility];  Service: ENT;  Laterality: Bilateral;    Prior to Admission medications   Medication Sig Start Date End Date Taking? Authorizing Provider  aspirin EC 81 MG tablet Take 81 mg by mouth daily at 12 noon.     [provider]  bismuth subsalicylate (PEPTO BISMOL) 262 MG/15ML suspension Take 30 mLs by  mouth every 6 (six) hours as needed for indigestion.    [provider]  busPIRone (BUSPAR) 5 MG tablet Take 15 mg by mouth 2 (two) times daily.     [provider]  Carbinoxamine Maleate (RYVENT) 6 MG TABS Take 6 mg by mouth 2 (two) times daily. Patient taking differently: Take 6 mg by mouth 2 (two) times daily as needed (allergies).  03/04/18   Marcelyn BruinsPadgett, Shaylar Patricia, MD  cetirizine (ZYRTEC) 10 MG tablet Take 10 mg by mouth daily.    [provider]  dexlansoprazole (DEXILANT) 60 MG capsule Take 1 capsule (60 mg total) by mouth daily. 11/19/17   Marcelyn BruinsPadgett, Shaylar Patricia, MD  diclofenac sodium (VOLTAREN) 1 % GEL Apply 2-4 g topically daily as needed (for knee pain).  06/27/16   [provider]  Fluticasone Propionate (XHANCE) 93 MCG/ACT EXHU Place 2 sprays into both nostrils 2 (two) times daily. 03/04/18   Marcelyn BruinsPadgett, Shaylar Patricia, MD  hydrochlorothiazide (HYDRODIURIL) 12.5 MG tablet Take 1 tablet (12.5 mg total) by mouth daily. For blood pressure. 05/27/18   Doreene Nestlark, Katherine K, NP  ipratropium (ATROVENT) 0.03 % nasal spray Place 2 sprays into both nostrils 4 (four) times daily. 11/19/17   Marcelyn BruinsPadgett, Shaylar Patricia, MD  ketotifen (ZADITOR) 0.025 % ophthalmic solution Place 1 drop into both eyes 2 (two) times daily. 11/19/17   Marcelyn BruinsPadgett, Shaylar Patricia, MD  levalbuterol [facility](XOPENEX HFA) 45 MCG/ACT inhaler TAKE 2 PUFFS BY MOUTH EVERY 6 HOURS AS NEEDED FOR WHEEZE 05/14/18   Padgett, Pilar GrammesShaylar Patricia, MD  levocetirizine (XYZAL) 2.5 MG/5ML solution Take 2.5 mg by mouth every evening.    [provider]  meclizine (ANTIVERT) 25 MG tablet Take 25 mg by mouth 3 (three) times daily as needed for dizziness.    [provider]  metoprolol succinate (TOPROL-XL) 50 MG 24 hr tablet TAKE 1 TABLET (50 MG TOTAL) BY MOUTH DAILY. TAKE WITH OR IMMEDIATELY FOLLOWING A MEAL. 05/10/18   Lennette BihariKelly, Thomas A, MD  montelukast (SINGULAIR) 10 MG tablet TAKE 1 TABLET BY MOUTH EVERYDAY  AT BEDTIME 05/10/18   Marcelyn BruinsPadgett, Shaylar Patricia, MD  nicotine polacrilex (NICORETTE) 4 MG gum Take 1 each (4 mg total) by mouth as needed for smoking cessation. 10/19/17   Doreene Nestlark, Katherine K, NP  ondansetron (ZOFRAN ODT) 4 MG disintegrating tablet Take 1 tablet (4 mg total) by mouth every 8 (eight) hours as needed for nausea or vomiting. 01/14/17   Vanetta MuldersZackowski, Scott, MD  promethazine (PHENERGAN) 25 MG tablet Take 1 tablet (25 mg total) by mouth every 6 (six) hours as needed for nausea or vomiting. 01/14/17   Vanetta MuldersZackowski, Scott, MD  ranitidine (ZANTAC) 150 MG tablet Take 1 tablet (150 mg total) by mouth 2 (two) times daily. 11/19/17   Marcelyn BruinsPadgett, Shaylar Patricia, MD  simvastatin (ZOCOR) 20 MG tablet Take 20 mg by mouth every evening.     [provider]  topiramate (TOPAMAX) 25 MG tablet Take 25 mg by mouth daily as needed (at onset of headaches).     [provider]  UNABLE TO FIND CPAP: At bedtime    [provider]    Allergies Paxil [paroxetine]  Family History  Problem Relation Age of Onset  . Arthritis Mother   . Hyperlipidemia Mother   . Hypertension Mother   . Stroke Mother   . Diabetes Mother   . Stomach cancer Mother   . Hyperlipidemia Father   . Heart disease Father 12  . Hypertension Father     Social History Social History   Tobacco Use  . Smoking status: Never Smoker  . Smokeless tobacco: Current User    Types: Chew  . Tobacco comment: he does need a patch  Substance Use Topics  . Alcohol use: Yes    Comment: social  . Drug use: No    Review of Systems Constitutional: No fever/chills Eyes: No visual changes. ENT: No sore throat. Cardiovascular: Positive for chest pain. Respiratory: Denies shortness of breath. Gastrointestinal: No abdominal pain.  No nausea, no vomiting.  No diarrhea.  No constipation. Genitourinary: Negative for dysuria. Musculoskeletal: Negative for neck pain.  Negative for back pain. Integumentary: Negative for  rash. Neurological: Negative for headaches, focal weakness or numbness.   ____________________________________________   PHYSICAL EXAM:  VITAL SIGNS: ED Triage Vitals  Enc Vitals Group     BP 06/05/18 2042 115/74     Pulse Rate 06/05/18 2042 87     Resp 06/05/18 2042 18     Temp 06/05/18 2042 98.7 F (37.1 C)     Temp Source 06/05/18 2042 Oral     SpO2 06/05/18 2042 100 %     Weight --      Height --      Head Circumference --      Peak Flow --      Pain Score 06/05/18 2033 8     Pain Loc --      Pain Edu? --      Excl. in GC? --     Constitutional: Alert and oriented. Well appearing and in no acute distress. Eyes: Conjunctivae are normal. PERRL. EOMI. Mouth/Throat: Mucous membranes are moist.  Oropharynx non-erythematous. Neck: No stridor.  Cardiovascular: Normal rate, regular rhythm. Good peripheral circulation. Grossly normal heart sounds. Respiratory: Normal respiratory effort.  No retractions. Lungs CTAB. Gastrointestinal: Soft and nontender. No distention.  Musculoskeletal: No lower extremity tenderness nor edema. No gross deformities of extremities. Neurologic:  Normal speech and language. No gross focal neurologic deficits are appreciated.  Skin:  Skin is warm, dry and intact. No rash noted. Psychiatric: Mood and affect are normal. Speech and behavior are normal.  ____________________________________________   LABS (all labs ordered are listed, but only abnormal results are displayed)  Labs Reviewed  BASIC METABOLIC PANEL - Abnormal; Notable for the following components:      Result Value   Sodium 134 (*)    Potassium 3.2 (*)    Glucose, Bld 139 (*)    All other components within normal limits  CBC - Abnormal; Notable for the following components:   WBC 12.1 (*)    All other components within normal limits  TROPONIN I  INFLUENZA PANEL BY PCR (TYPE A & B)  FIBRIN DERIVATIVES D-DIMER ([facility] ONLY)  TROPONIN I  TSH    ____________________________________________  EKG  ED ECG REPORT I, [place] Nyella Eckels, the attending physician, personally viewed and interpreted this ECG.   Date: 06/05/2018  EKG Time: 8:35 PM  Rate: 89  Rhythm: Normal sinus rhythm  Axis: Normal  Intervals: Normal  ST&T Change: None  ____________________________________________  RADIOLOGY I, [place] N Coen Miyasato, personally viewed and evaluated these images (plain radiographs) as  part of my medical decision making, as well as reviewing the written report by the radiologist.  ED MD interpretation: No acute abnormality noted on chest x-ray.  Official radiology report(s): Dg Chest 2 View  Result Date: 06/05/2018 CLINICAL DATA:  Chest pain for 1 week EXAM: CHEST - 2 VIEW COMPARISON:  04/02/2018 FINDINGS: Cardiac shadows within normal limits. Mild aortic calcifications are noted. Lungs are well aerated bilaterally. No focal infiltrate is seen. No bony abnormality is noted. IMPRESSION: No acute abnormality noted. Electronically Signed   By: Alcide Clever M.D.   On: 06/05/2018 21:09      Procedures   ____________________________________________   INITIAL IMPRESSION / ASSESSMENT AND PLAN / ED COURSE  As part of my medical decision making, I reviewed the following data within the electronic MEDICAL RECORD NUMBER   48 year old male presenting with above-stated history and physical exam secondary to multiple medical complaints.  Regarding the patient's generalized muscle aches and objective fevers suspect possible influenza which was obtained patient was positive for influenza A.  Patient states that he has been sick for over a week and therefore he is now within the therapeutic window for Tamiflu.  Spoke with the patient regarding management at home regarding the patient's syncopal episode question if the patient was hyperventilating for the event which resulted in tingling sensation and subsequently syncope.  Patient does admit to a history  of anxiety and panic attacks and does admit to rapid breathing before the event.  Consider the possibility of the patient having hyperthyroidism given family history of thyroid disease.  Patient's son Mosetta Pigeon "thyroid problems".  Patient's TSH noted to be 0.487 as such I informed the patient to follow-up with his primary care provider for further outpatient thyroid evaluation.  _______________________________  FINAL CLINICAL IMPRESSION(S) / ED DIAGNOSES  Final diagnoses:  Influenza A  Syncope, unspecified syncope type  Hyperthyroidism     MEDICATIONS GIVEN DURING THIS VISIT:  Medications  morphine 2 MG/ML injection 2 mg (has no administration in time range)  ondansetron (ZOFRAN) injection 4 mg (has no administration in time range)  sodium chloride flush (NS) 0.9 % injection 3 mL (3 mLs Intravenous Given 06/05/18 2327)     ED Discharge Orders    None       Note:  This document was prepared using Dragon voice recognition software and may include unintentional dictation errors.    Darci Current, MD 06/06/18 [phone]

## 2018-06-06 LAB — FIBRIN DERIVATIVES D-DIMER (ARMC ONLY): Fibrin derivatives D-dimer (ARMC): 405.42 ng/mL (FEU) (ref 0.00–499.00)

## 2018-06-06 LAB — INFLUENZA PANEL BY PCR (TYPE A & B)
Influenza A By PCR: POSITIVE — AB
Influenza B By PCR: NEGATIVE

## 2018-06-06 LAB — TSH: TSH: 0.487 u[IU]/mL (ref 0.350–4.500)

## 2018-06-06 LAB — TROPONIN I: Troponin I: <0.03 ng/mL (ref <0.03)

## 2018-06-07 NOTE — Telephone Encounter (Signed)
Patient made an appt to see Jae DireKate on 06/08/2018

## 2018-06-08 ENCOUNTER — Ambulatory Visit: Payer: 59 | Admitting: Primary Care

## 2018-06-08 ENCOUNTER — Encounter: Payer: Self-pay | Admitting: Primary Care

## 2018-06-08 VITALS — BP 134/84 | HR 79 | Temp 98.2°F | Ht 69.0 in | Wt 194.5 lb

## 2018-06-08 DIAGNOSIS — I10 Essential (primary) hypertension: Secondary | ICD-10-CM | POA: Diagnosis not present

## 2018-06-08 DIAGNOSIS — I259 Chronic ischemic heart disease, unspecified: Secondary | ICD-10-CM

## 2018-06-08 DIAGNOSIS — R42 Dizziness and giddiness: Secondary | ICD-10-CM | POA: Insufficient documentation

## 2018-06-08 DIAGNOSIS — R002 Palpitations: Secondary | ICD-10-CM

## 2018-06-08 LAB — TSH: TSH: 1 u[IU]/mL (ref 0.35–4.50)

## 2018-06-08 LAB — T4, FREE: Free T4: 0.83 ng/dL (ref 0.60–1.60)

## 2018-06-08 MED ORDER — ONDANSETRON 4 MG PO TBDP: 4.0000 mg | ORAL_TABLET | Freq: Three times a day (TID) | ORAL | 0 refills | Status: DC | PRN

## 2018-06-08 MED ORDER — MECLIZINE HCL 25 MG PO TABS: 25.0000 mg | ORAL_TABLET | Freq: Three times a day (TID) | ORAL | 0 refills | Status: DC | PRN

## 2018-06-08 NOTE — Assessment & Plan Note (Signed)
Chronic and intermittent. Neuro exam today unremarkable. Refills provided for meclizine and Zofran. Referral placed to ENT for further evaluation.

## 2018-06-08 NOTE — Patient Instructions (Signed)
You will be contacted regarding your referral to ENT.  Please let us know if you have not been contacted within one week.   I sent refills for the meclizine and ondansetron for dizziness and nausea.   [facility]   Stop by the lab prior to leaving today. I will notify you of your results once received.   Continue your hydrochlorothiazide and metoprolol blood pressure medications.  It was a pleasure to see you today!

## 2018-06-08 NOTE — Assessment & Plan Note (Signed)
Recent evaluation in the emergency department. Work-up overall negative. He has an appoint with his cardiologist in mid February and will follow up with schedule.  Hospital labs, notes, imaging reviewed.

## 2018-06-08 NOTE — Assessment & Plan Note (Signed)
Improved in the office today, home readings are even better. Continue hydrochlorothiazide in the morning and metoprolol succinate at bedtime.

## 2018-06-08 NOTE — Progress Notes (Signed)
Subjective:    Patient ID: Ronald Burton, male    DOB: 1971/01/06, 48 y.o.   MRN: [id number]  HPI  Ronald Burton is a 48 year old male with a history of hypertension, unstable angina, OSA, GERD, tobacco abuse, hyperlipidemia, prediabetes, chest pain who presents today for emergency department follow up.  He presented to [facility] on 05/31/18 with a chief complaint of hypertension. He was in our office several days prior for same complaints. Hydrochlorothiazide was re-initiated and his metoprolol was continued. During his UC visit he endorsed palpitations that were anxiety related. BP during his visit was 143/88. It was recommended he take his metoprolol at bed and HCTZ in the morning. He was discharged home.  He presented to [facility] ED on 06/05/18 with a chief complaint of chest pain, fevers, body aches. His chest pain was intermittent over the last prior week, worse over the last several days. Also endorsed palpitations, shortness of breath, upper and lower extremity numbness/tingling, loss of consciousness. Paramedics had to arouse him with an ammonia inhalent.   During his ED stay his chest xray was negative. ECG with NSR with rate of 89, no acute abnormality. Labs including D-dimer and Troponin were negative.  He tested positive for influenza A, was not provided with treatment for Tamiflu as he was outside of the window.. It was discussed that he may have potential hyperthyroidism given TSH of 0.487 so he was asked to follow up with PCP.   Since his UC and ED visits he's been checking his BP which is running 115-130's/70's-80's. His main concern is his heart palpitations which occur sporadically during the day and evening. He has an appointment with his cardiologist on February 14th. He recently became compliant to his CPAP machine. He also endorses chronic tinnitus and dizziness/feeling off balance and as though the room and his environment is spinning. His last several episodes have been worse. He has  taken Meclizine and Zofran in the past with some improvement.  He was told that he may need evaluation for Mnire's disease.  Overall feeling much better since diagnosed with the flu.  Denies fevers, headaches, body aches, chills.  BP Readings from Last 3 Encounters:  06/08/18 134/84  06/06/18 114/73  05/31/18 (!) 143/88     Review of Systems  Constitutional: Negative for fever.  Respiratory: Negative for cough and shortness of breath.   Cardiovascular: Positive for palpitations. Negative for chest pain.  Neurological: Positive for dizziness. Negative for headaches.       Past Medical History:  Diagnosis Date  . Anxiety   . Arthritis    knees  . BPH (benign prostatic hyperplasia)   . Complication of anesthesia    DIFFICULTY WAKING AFTER SINUS SURGERY  . Depression   . Deviated septum   . GERD (gastroesophageal reflux disease)   . Headache   . Hyperlipidemia   . Hypertension   . Irregular heart beat   . Migraines   . OSA (obstructive sleep apnea)    does not use CPAP  . Palpitations   . Seasonal allergies   . Tinnitus   . Urine incontinence      Social History   Socioeconomic History  . Marital status: Married    Spouse name: Not on file  . Number of children: Not on file  . Years of education: Not on file  . Highest education level: Not on file  Occupational History  . 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 Financial resource strain:  Not on file  . Food insecurity:    Worry: Not on file    Inability: Not on file  . Transportation needs:    Medical: Not on file    Non-medical: Not on file  Tobacco Use  . Smoking status: Never Smoker  . Smokeless tobacco: Current User    Types: Chew  . Tobacco comment: he does need a patch  Substance and Sexual Activity  . Alcohol use: Yes    Comment: social  . Drug use: No  . Sexual activity: Not on file  Lifestyle  . Physical activity:    Days per week: Not on file    Minutes per session: Not on file   . Stress: Not on file  Relationships  . Social connections:    Talks on phone: Not on file    Gets together: Not on file    Attends religious service: Not on file    Active member of club or organization: Not on file    Attends meetings of clubs or organizations: Not on file    Relationship status: Not on file  . Intimate partner violence:    Fear of current or ex partner: Not on file    Emotionally abused: Not on file    Physically abused: Not on file    Forced sexual activity: Not on file  Other Topics Concern  . Not on file  Social History Narrative   Married.   6 children, 1 grandchild.   Works as a Art therapistgeneral manager   Enjoys spending time with family, video games.     Past Surgical History:  Procedure Laterality Date  . ETHMOIDECTOMY Bilateral 08/04/2017   Procedure: BILATERAL ETHMOIDECTOMY WITH TISSUE REMOVAL;  Surgeon: Newman Pieseoh, Su, MD;  Location: [facility];  Service: ENT;  Laterality: Bilateral;  . EXCISION CHONCHA BULLOSA Left 08/04/2017   Procedure: LEFT CONCHA BULLOSA RESECTION;  Surgeon: Newman Pieseoh, Su, MD;  Location: [facility];  Service: ENT;  Laterality: Left;  . FRONTAL SINUS EXPLORATION Bilateral 08/04/2017   Procedure: BILATERAL FRONTAL RECESS SINUS EXPLORATION;  Surgeon: Newman Pieseoh, Su, MD;  Location: [facility];  Service: ENT;  Laterality: Bilateral;  . LEFT HEART CATH AND CORONARY ANGIOGRAPHY N/A 10/16/2017   Procedure: LEFT HEART CATH AND CORONARY ANGIOGRAPHY;  Surgeon: Marykay LexHarding, David W, MD;  [facility];  Service: Cardiovascular;  Laterality: N/A;  . MAXILLARY ANTROSTOMY Bilateral 08/04/2017   Procedure: BILATERAL MAXILLARY ANTROSTOMY WITH TISSUE REMOVAL;  Surgeon: Newman Pieseoh, Su, MD;  Location: [facility];  Service: ENT;  Laterality: Bilateral;  . NASAL SEPTOPLASTY W/ TURBINOPLASTY N/A 08/04/2017   Procedure: NASAL SEPTOPLASTY WITH TURBINATE REDUCTION;  Surgeon: Newman Pieseoh, Su, MD;  Location: [facility];  Service: ENT;  Laterality: N/A;  . SINOSCOPY    . SINUS ENDO WITH FUSION Bilateral 08/04/2017   Procedure: SINUS ENDO WITH FUSION NAVIGATION;  Surgeon: Newman Pieseoh, Su, MD;  Location: [facility];  Service: ENT;  Laterality: Bilateral;  . SPHENOIDECTOMY Bilateral 08/04/2017   Procedure: BILATERAL SPHENOIDECTOMY WITH TISSUE REMOVAL;  Surgeon: Newman Pieseoh, Su, MD;  Location:  [facility];  Service: ENT;  Laterality: Bilateral;    Family History  Problem Relation Age of Onset  . Arthritis Mother   . Hyperlipidemia Mother   . Hypertension Mother   . Stroke Mother   . Diabetes Mother   . Stomach cancer Mother   . Hyperlipidemia Father   . Heart disease Father 950  .  Hypertension Father     Allergies  Allergen Reactions  . Paxil [Paroxetine] Other (See Comments)    Induced panic    Current Outpatient Medications on File Prior to Visit  Medication Sig Dispense Refill  . aspirin EC 81 MG tablet Take 81 mg by mouth daily at 12 noon.     . bismuth subsalicylate (PEPTO BISMOL) 262 MG/15ML suspension Take 30 mLs by mouth every 6 (six) hours as needed for indigestion.    . busPIRone (BUSPAR) 5 MG tablet Take 15 mg by mouth 2 (two) times daily.     . Carbinoxamine Maleate (RYVENT) 6 MG TABS Take 6 mg by mouth 2 (two) times daily. (Patient taking differently: Take 6 mg by mouth 2 (two) times daily as needed (allergies). ) 120 tablet 4  . cetirizine (ZYRTEC) 10 MG tablet Take 10 mg by mouth daily.    Marland Kitchen dexlansoprazole (DEXILANT) 60 MG capsule Take 1 capsule (60 mg total) by mouth daily. 90 capsule 2  . diclofenac sodium (VOLTAREN) 1 % GEL Apply 2-4 g topically daily as needed (for knee pain).     . Fluticasone Propionate (XHANCE) 93 MCG/ACT EXHU Place 2 sprays into both nostrils 2 (two) times daily. 16 mL 5  . hydrochlorothiazide (HYDRODIURIL) 12.5 MG tablet Take 1 tablet (12.5 mg total) by mouth daily. For blood pressure. 90 tablet 0  . ipratropium (ATROVENT) 0.03 % nasal  spray Place 2 sprays into both nostrils 4 (four) times daily. 30 mL 5  . ketotifen (ZADITOR) 0.025 % ophthalmic solution Place 1 drop into both eyes 2 (two) times daily. 5 mL 5  . levalbuterol (XOPENEX HFA) 45 MCG/ACT inhaler TAKE 2 PUFFS BY MOUTH EVERY 6 HOURS AS NEEDED FOR WHEEZE 15 Inhaler 2  . levocetirizine (XYZAL) 2.5 MG/5ML solution Take 2.5 mg by mouth every evening.    . metoprolol succinate (TOPROL-XL) 50 MG 24 hr tablet TAKE 1 TABLET (50 MG TOTAL) BY MOUTH DAILY. TAKE WITH OR IMMEDIATELY FOLLOWING A MEAL. 30 tablet 8  . montelukast (SINGULAIR) 10 MG tablet TAKE 1 TABLET BY MOUTH EVERYDAY AT BEDTIME 30 tablet 3  . nicotine polacrilex (NICORETTE) 4 MG gum Take 1 each (4 mg total) by mouth as needed for smoking cessation. 100 tablet 0  . promethazine (PHENERGAN) 25 MG tablet Take 1 tablet (25 mg total) by mouth every 6 (six) hours as needed for nausea or vomiting. 12 tablet 1  . ranitidine (ZANTAC) 150 MG tablet Take 1 tablet (150 mg total) by mouth 2 (two) times daily. 60 tablet 5  . simvastatin (ZOCOR) 20 MG tablet Take 20 mg by mouth every evening.     . topiramate (TOPAMAX) 25 MG tablet Take 25 mg by mouth daily as needed (at onset of headaches).     Marland Kitchen UNABLE TO FIND CPAP: At bedtime     No current facility-administered medications on file prior to visit.     BP 134/84   Pulse 79   Temp 98.2 F (36.8 C) (Oral)   Ht 5\' 9"  (1.753 m)   Wt 194 lb 8 oz (88.2 kg)   SpO2 98%   BMI 28.72 kg/m    Objective:   Physical Exam  Constitutional: He is oriented to person, place, and time. He appears well-nourished.  Eyes: EOM are normal.  Neck: Neck supple.  Cardiovascular: Normal rate and regular rhythm.  Respiratory: Effort normal and breath sounds normal.  Neurological: He is oriented to person, place, and time. No cranial nerve deficit.  Skin: Skin is warm and dry.  Psychiatric: He has a normal mood and affect.           Assessment & Plan:

## 2018-06-08 NOTE — Assessment & Plan Note (Signed)
Chronic and continuous. He has appoint with cardiology in mid February, recommended Holter monitor evaluation given persistent palpitations. Hospital labs reviewed and unremarkable.

## 2018-06-23 DIAGNOSIS — J310 Chronic rhinitis: Secondary | ICD-10-CM | POA: Diagnosis not present

## 2018-06-23 DIAGNOSIS — H9313 Tinnitus, bilateral: Secondary | ICD-10-CM | POA: Diagnosis not present

## 2018-06-23 DIAGNOSIS — R42 Dizziness and giddiness: Secondary | ICD-10-CM | POA: Diagnosis not present

## 2018-06-23 DIAGNOSIS — J343 Hypertrophy of nasal turbinates: Secondary | ICD-10-CM | POA: Diagnosis not present

## 2018-06-23 DIAGNOSIS — J320 Chronic maxillary sinusitis: Secondary | ICD-10-CM | POA: Diagnosis not present

## 2018-06-25 ENCOUNTER — Ambulatory Visit: Payer: 59 | Admitting: Cardiovascular Disease

## 2018-06-25 ENCOUNTER — Encounter: Payer: Self-pay | Admitting: Cardiovascular Disease

## 2018-06-25 ENCOUNTER — Telehealth: Payer: Self-pay

## 2018-06-25 VITALS — BP 122/84 | HR 79 | Ht 68.0 in | Wt 198.0 lb

## 2018-06-25 DIAGNOSIS — R002 Palpitations: Secondary | ICD-10-CM | POA: Diagnosis not present

## 2018-06-25 DIAGNOSIS — G4733 Obstructive sleep apnea (adult) (pediatric): Secondary | ICD-10-CM

## 2018-06-25 DIAGNOSIS — I10 Essential (primary) hypertension: Secondary | ICD-10-CM

## 2018-06-25 DIAGNOSIS — F17200 Nicotine dependence, unspecified, uncomplicated: Secondary | ICD-10-CM

## 2018-06-25 DIAGNOSIS — Z8669 Personal history of other diseases of the nervous system and sense organs: Secondary | ICD-10-CM

## 2018-06-25 DIAGNOSIS — E785 Hyperlipidemia, unspecified: Secondary | ICD-10-CM

## 2018-06-25 MED ORDER — METOPROLOL SUCCINATE ER 50 MG PO TB24: ORAL_TABLET | ORAL | 8 refills | Status: DC

## 2018-06-25 MED ORDER — HYDROCHLOROTHIAZIDE 12.5 MG PO TABS: 12.5000 mg | ORAL_TABLET | ORAL | 0 refills | Status: DC

## 2018-06-25 NOTE — Patient Instructions (Signed)
Medication Instructions:  Increase Metoprolol to 75 mg (1.5 tablets) daily Decrease HCTZ to every other day. If you need a refill on your cardiac medications before your next appointment, please call your pharmacy.     Follow-Up: At [facility], you and your health needs are our priority.  As part of our continuing mission to provide you with exceptional heart care, we have created designated Provider Care Teams.  These Care Teams include your primary Cardiologist (physician) and Advanced Practice Providers (APPs -  Physician Assistants and Nurse Practitioners) who all work together to provide you with the care you need, when you need it. You will need a follow up appointment in 6 months.  Please call our office 2 months in advance to schedule this appointment.  You may see Nicki Guadalajara, MD or one of the following Advanced Practice Providers on your designated Care Team: [place], [place] . Micah Flesher, PA-C

## 2018-06-25 NOTE — Telephone Encounter (Signed)
Left message for the patient to call back to discuss referral to ENT.

## 2018-06-25 NOTE — Progress Notes (Signed)
Cardiology Office Note    Date:  06/27/2018   ID:  Ronald Burton, DOB May 04, 1971, MRN [id number]  PCP:  Pleas Koch, NP  Cardiologist:  Shelva Majestic, MD   No chief complaint on file.  F/U initially referred by Dr. Sherwood Gambler for evaluation of palpitations and atypical chest pain  History of Present Illness:  Ronald Burton is a 48 y.o. male who has a history of hypertension, hyperlipidemia, and had recently presented to the emergency room with palpitations, and sharp chest discomfort.  I saw him for initial cardiology evaluation on 03/12/2017 and last saw him in December 2018.  He presents for a 22-monthfollow-up cardiology evaluation.   Ronald Burton to history of hypertension for at least 4 years.  He also has a history of migraine headaches, panic attacks, experience episodes of chest pain which he describes as chest burning, at times her chest tightness and more often are a sharp knifelike sensation.  He has experienced palpitations off and on for several months and oftentimes wakes up in the middle of night with palpitations.  These palpitations may last 10-15 minutes.  He admits to having significant sinus issues.  He is sleep is poor.  He apparently was told of having sleep ap[place]  He has a CPAP machine but has not been using it.  He admits to snoring and at times wakes up gasping for breath.  [place] was [place].  He  presented to the emergency room with a feeling that his heart was beating out of his chest and also with some sharp chest pain.  He was tachycardic with a pulse up to 100.  ECG showed early repolarization.  I saw for initial cardiology evaluation 03/05/2017.  I reviewed his prior laboratory and recommended magnesium, TSH and free T4 checked which were normal.  An echo Doppler study on 03/12/2017 showed an ejection fraction of 60-65% with mild LVH.  He had normal  diastolic function.  He had normal valvular architecture.  He felt significant improved since initiating metoprolol and is now taking this 25 mg twice a day.  He is unaware of any recurrent palpitations.  When I last saw him, I recommended that he reinstitute his CPAP therapy and discussed potential adverse consequences of untreated sleep apnea particularly with reference to his hypertension as well as nocturnal palpitations and other related issues.  I discussed the importance of avoiding pseudoephedrine preparations.  Since I last saw him, he did reinstitute CPAP therapy and is sleeping significantly improved.  He has had several evaluations over the past year and was hospitalized overnight in June, and was reevaluated again in the emergency room in November 2019 and January 2020.  He recently saw KJerrel Ivorynurse practitioner in follow-up of his most recent ER evaluation.  He still notes at times his chest pounding at night.  Since I last saw him, he was on lisinopril HCT but is no longer on lisinopril but has been on HCTZ 12.5 mg for the past 3 weeks.  He is also on Toprol-XL 50 mg daily.  He has been on simvastatin for hyperlipidemia.  He has been on BuSpar.  He states he has continued to use his CPAP.  He has GERD.  At times he continues to have some blood pressure lability.  He is still dipping tobacco, previously 1 can/day more recently 1/2 can/day.  He presents for evaluation.  Past Medical History:  Diagnosis Date  . Anxiety   . Arthritis    knees  . BPH (benign prostatic hyperplasia)   . Complication of anesthesia    DIFFICULTY WAKING AFTER SINUS SURGERY  . Depression   . Deviated septum   . GERD (gastroesophageal reflux disease)   . Headache   . Hyperlipidemia   . Hypertension   . Irregular heart beat   . Migraines   . OSA (obstructive sleep apnea)    does not use CPAP  . Palpitations   . Seasonal allergies   . Tinnitus   . Urine incontinence     Past Surgical History:    Procedure Laterality Date  . ETHMOIDECTOMY Bilateral 08/04/2017   Procedure: BILATERAL ETHMOIDECTOMY WITH TISSUE REMOVAL;  Surgeon: Leta Baptist, MD;  Location: [facility];  Service: ENT;  Laterality: Bilateral;  . EXCISION CHONCHA BULLOSA Left 08/04/2017   Procedure: LEFT CONCHA BULLOSA RESECTION;  Surgeon: Leta Baptist, MD;  Location: [facility];  Service: ENT;  Laterality: Left;  . FRONTAL SINUS EXPLORATION Bilateral 08/04/2017   Procedure: BILATERAL FRONTAL RECESS SINUS EXPLORATION;  Surgeon: Leta Baptist, MD;  Location: [facility];  Service: ENT;  Laterality: Bilateral;  . LEFT HEART CATH AND CORONARY ANGIOGRAPHY N/A 10/16/2017   Procedure: LEFT HEART CATH AND CORONARY ANGIOGRAPHY;  Surgeon: Leonie Man, MD;  Location: [facility];  Service: Cardiovascular;  Laterality: N/A;  . MAXILLARY ANTROSTOMY Bilateral 08/04/2017   Procedure: BILATERAL MAXILLARY ANTROSTOMY WITH TISSUE REMOVAL;  Surgeon: Leta Baptist, MD;  Location: [facility];  Service: ENT;  Laterality: Bilateral;  . NASAL SEPTOPLASTY W/ TURBINOPLASTY N/A 08/04/2017   Procedure: NASAL SEPTOPLASTY WITH TURBINATE REDUCTION;  Surgeon: Leta Baptist, MD;  Location: [facility];  Service: ENT;  Laterality: N/A;  . SINOSCOPY    . SINUS ENDO WITH FUSION Bilateral 08/04/2017   Procedure: SINUS ENDO WITH FUSION NAVIGATION;  Surgeon: Leta Baptist, MD;  Location: [facility];  Service: ENT;  Laterality: Bilateral;  . SPHENOIDECTOMY Bilateral 08/04/2017   Procedure: BILATERAL SPHENOIDECTOMY WITH TISSUE REMOVAL;  Surgeon: Leta Baptist, MD;  Location: [facility];  Service: ENT;  Laterality: Bilateral;    Current Medications: Outpatient Medications Prior to Visit  Medication Sig Dispense Refill  . aspirin EC 81 MG tablet Take 81 mg by mouth daily at 12 noon.     . bismuth subsalicylate (PEPTO BISMOL) 262 MG/15ML suspension Take 30 mLs by mouth every 6 (six) hours as  needed for indigestion.    . busPIRone (BUSPAR) 5 MG tablet Take 15 mg by mouth 2 (two) times daily.     . Carbinoxamine Maleate (RYVENT) 6 MG TABS Take 6 mg by mouth 2 (two) times daily. (Patient taking differently: Take 6 mg by mouth 2 (two) times daily as needed (allergies). ) 120 tablet 4  . cetirizine (ZYRTEC) 10 MG tablet Take 10 mg by mouth daily.    Marland Kitchen dexlansoprazole (DEXILANT) 60 MG capsule Take 1 capsule (60 mg total) by mouth daily. 90 capsule 2  . diclofenac sodium (VOLTAREN) 1 % GEL Apply 2-4 g topically daily as needed (for knee pain).     . Fluticasone Propionate (XHANCE) 93 MCG/ACT EXHU Place 2 sprays into both nostrils 2 (two) times daily. 16 mL 5  . ipratropium (ATROVENT) 0.03 % nasal spray Place 2 sprays into both nostrils 4 (four) times daily. 30 mL 5  . ketotifen (ZADITOR) 0.025 % ophthalmic solution  Place 1 drop into both eyes 2 (two) times daily. 5 mL 5  . levalbuterol (XOPENEX HFA) 45 MCG/ACT inhaler TAKE 2 PUFFS BY MOUTH EVERY 6 HOURS AS NEEDED FOR WHEEZE 15 Inhaler 2  . levocetirizine (XYZAL) 2.5 MG/5ML solution Take 2.5 mg by mouth every evening.    . meclizine (ANTIVERT) 25 MG tablet Take 1 tablet (25 mg total) by mouth 3 (three) times daily as needed for dizziness. 30 tablet 0  . montelukast (SINGULAIR) 10 MG tablet TAKE 1 TABLET BY MOUTH EVERYDAY AT BEDTIME 30 tablet 3  . nicotine polacrilex (NICORETTE) 4 MG gum Take 1 each (4 mg total) by mouth as needed for smoking cessation. 100 tablet 0  . ondansetron (ZOFRAN ODT) 4 MG disintegrating tablet Take 1 tablet (4 mg total) by mouth every 8 (eight) hours as needed for nausea or vomiting. 15 tablet 0  . promethazine (PHENERGAN) 25 MG tablet Take 1 tablet (25 mg total) by mouth every 6 (six) hours as needed for nausea or vomiting. 12 tablet 1  . ranitidine (ZANTAC) 150 MG tablet Take 1 tablet (150 mg total) by mouth 2 (two) times daily. 60 tablet 5  . simvastatin (ZOCOR) 20 MG tablet Take 20 mg by mouth every evening.     .  topiramate (TOPAMAX) 25 MG tablet Take 25 mg by mouth daily as needed (at onset of headaches).     Marland Kitchen UNABLE TO FIND CPAP: At bedtime    . hydrochlorothiazide (HYDRODIURIL) 12.5 MG tablet Take 1 tablet (12.5 mg total) by mouth daily. For blood pressure. 90 tablet 0  . metoprolol succinate (TOPROL-XL) 50 MG 24 hr tablet TAKE 1 TABLET (50 MG TOTAL) BY MOUTH DAILY. TAKE WITH OR IMMEDIATELY FOLLOWING A MEAL. 30 tablet 8   No facility-administered medications prior to visit.      Allergies:   Paxil [paroxetine]   Social History   Socioeconomic History  . Marital status: Married    Spouse name: Not on file  . Number of children: Not on file  . Years of education: Not on file  . Highest education level: Not on file  Occupational History  . Occupation: Freight forwarder for [employer]  . Financial resource strain: Not on file  . Food insecurity:    Worry: Not on file    Inability: Not on file  . Transportation needs:    Medical: Not on file    Non-medical: Not on file  Tobacco Use  . Smoking status: Never Smoker  . Smokeless tobacco: Current User    Types: Chew  . Tobacco comment: he does need a patch  Substance and Sexual Activity  . Alcohol use: Yes    Comment: social  . Drug use: No  . Sexual activity: Not on file  Lifestyle  . Physical activity:    Days per week: Not on file    Minutes per session: Not on file  . Stress: Not on file  Relationships  . Social connections:    Talks on phone: Not on file    Gets together: Not on file    Attends religious service: Not on file    Active member of club or organization: Not on file    Attends meetings of clubs or organizations: Not on file    Relationship status: Not on file  Other Topics Concern  . Not on file  Social History Narrative   Married.   6 children, 1 grandchild.   Works as a Health and safety inspector  Enjoys spending time with family, video games.     Socially he is married.  He has 3 children and 2 grandchildren.   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  He completed 12th grade of education.  He chews tobacco.  He does not drink alcohol.  He does not routinely exercise.  Family History:  The patient's family history includes Arthritis in his mother; Diabetes in his mother; Heart disease (age of onset: 50) in his father; Hyperlipidemia in his father and mother; Hypertension in his father and mother; Stomach cancer in his mother; Stroke in his mother.  His mother 56, father 56.  He has one alive.  Brother at age 84, one brother died age 34.  He has 2 sisters and 3 children.  ROS General: Negative; No fevers, chills, or night sweats;  HEENT: Negative; No changes in vision or hearing, sinus congestion, difficulty swallowing Pulmonary: Negative; No cough, wheezing, shortness of breath, hemoptysis Cardiovascular: see HPI GI: Negative; No nausea, vomiting, diarrhea, or abdominal pain GU: Negative; No dysuria, hematuria, or difficulty voiding Musculoskeletal: Negative; no myalgias, joint pain, or weakness Hematologic/Oncology: Negative; no easy bruising, bleeding Endocrine: Negative; no heat/cold intolerance; no diabetes Neuro: Positive for history of migraines and panic attacks. Skin: Negative; No rashes or skin lesions Psychiatric: Negative; No behavioral problems, depression Sleep: Positive for reported history of OSA, and admits to CPAP use currently.  He admits to snoring, nocturia, nonrestorative sleep and awakening gasping for breath.   Other comprehensive 14 point system review is negative.   PHYSICAL EXAM:   VS:  BP 122/84   Pulse 79   Ht 5' 8" (1.727 m)   Wt 198 lb (89.8 kg)   BMI 30.11 kg/m     Repeat blood pressure by me was 120/80  Wt Readings from Last 3 Encounters:  06/25/18 198 lb (89.8 kg)  06/08/18 194 lb 8 oz (88.2 kg)  05/27/18 198 lb (89.8 kg)    General: Alert, oriented, no distress.  Skin: normal turgor, no rashes, warm and dry HEENT:  Normocephalic, atraumatic. Pupils equal round and reactive to light; sclera anicteric; extraocular muscles intact;  Nose without nasal septal hypertrophy Mouth/Parynx benign; Mallinpatti scale 3 Neck: No JVD, no carotid bruits; normal carotid upstroke Lungs: clear to ausculatation and percussion; no wheezing or rales Chest wall: without tenderness to palpitation Heart: PMI not displaced, RRR, no ectopy S1 s2 normal, 1/6 systolic murmur, no diastolic murmur, no rubs, gallops, thrills, or heaves Abdomen: soft, nontender; no hepatosplenomehaly, BS+; abdominal aorta nontender and not dilated by palpation. Back: no CVA tenderness Pulses 2+ Musculoskeletal: full range of motion, normal strength, no joint deformities Extremities: no clubbing cyanosis or edema, Homan's sign negative  Neurologic: grossly nonfocal; Cranial nerves grossly wnl Psychologic: Normal mood and affect   Studies/Labs Reviewed:   EKG:  EKG is ordered today. ECG (independently read by me): Normal sinus rhythm at 79 bpm.  No ectopy.  Normal intervals.  April 27, 2017 ECG (independently read by me): Normal sinus rhythm at 78 bpm.  No ectopy.  Normal intervals.  October 25, /2018 ECG (independently read by me): Normal sinus rhythm at 92 bpm.  Normal intervals.  No ectopy.  Recent Labs: BMP Latest Ref Rng & Units 06/05/2018 04/02/2018 01/20/2018  Glucose 70 - 99 mg/dL 139(H) 106(H) 103(H)  BUN 6 - 20 mg/dL _0 Creatinine 0.61 - 1.24 mg/dL 1.10 1.10 1.08  Sodium 135 - 145 mmol/L 134(L)  139 137  Potassium 3.5 - 5.1 mmol/L 3.2(L) 4.1 3.7  Chloride 98 - 111 mmol/L 100 108 103  CO2 22 - 32 mmol/L _0 Calcium 8.9 - 10.3 mg/dL 9.6 9.4 9.3     Hepatic Function Latest Ref Rng & Units 01/20/2018 01/14/2017 12/01/2016  Total Protein 6.0 - 8.3 g/dL 7.0 7.0 7.0  Albumin 3.5 - 5.2 g/dL 4.1 4.1 4.3  AST 0 - 37 U/L _1 ALT 0 - 53 U/L _2 Alk Phosphatase 39 - 117 U/L 74 77 73  Total Bilirubin 0.2 - 1.2 mg/dL  1.3(H) 1.2 1.1    CBC Latest Ref Rng & Units 06/05/2018 04/02/2018 10/16/2017  WBC 4.0 - 10.5 K/uL 12.1(H) 9.7 11.0(H)  Hemoglobin 13.0 - 17.0 g/dL 15.8 16.0 14.2  Hematocrit 39.0 - 52.0 % 46.6 48.1 42.8  Platelets 150 - 400 K/uL 269 172 331   Lab Results  Component Value Date   MCV 80.6 06/05/2018   MCV 83.2 04/02/2018   MCV 82.5 10/16/2017   Lab Results  Component Value Date   TSH 1.00 06/08/2018   Lab Results  Component Value Date   HGBA1C 6.0 01/20/2018     BNP No results found for: BNP  ProBNP No results found for: PROBNP   Lipid Panel     Component Value Date/Time   CHOL 131 10/16/2017 0308   TRIG 90 10/16/2017 0308   HDL 31 (L) 10/16/2017 0308   CHOLHDL 4.2 10/16/2017 0308   VLDL 18 10/16/2017 0308   LDLCALC 82 10/16/2017 0308     RADIOLOGY: Dg Chest 2 View  Result Date: 06/05/2018 CLINICAL DATA:  Chest pain for 1 week EXAM: CHEST - 2 VIEW COMPARISON:  04/02/2018 FINDINGS: Cardiac shadows within normal limits. Mild aortic calcifications are noted. Lungs are well aerated bilaterally. No focal infiltrate is seen. No bony abnormality is noted. IMPRESSION: No acute abnormality noted. Electronically Signed   By: Inez Catalina M.D.   On: 06/05/2018 21:09     Additional studies/ records that were reviewed today include:  I reviewed the emergency room records and the multiple evaluations he has had since his last office visit with me in December 2018    ASSESSMENT:    1. Palpitations   2. Essential hypertension   3. OSA (obstructive sleep apnea)   4. History of migraine headaches   5. Hyperlipidemia, unspecified hyperlipidemia type   6. Nicotine dependence with current use     PLAN:  Ronald Burton is a 48 year old African-American male who has a  history of hypertension, a history of migraine headaches, panic attacks, and remotely had experienced episodes of chest pain for which he had undergone a stress test while living in [place], [place] which he  states was normal.  He he had experienced increasing palpitations as well as episodes of sharp chest pain as well as chest burning and some vague chest tightness.  His echo Doppler study revealed normal systolic and diastolic function.  He had been on lisinopril HCT in the past.  When I last saw him his palpitations were improved with initiation of metoprolol 25 mg twice a day.  He has experienced continued episodes where sense of increased heart rate at night.  More recently he has been on metoprolol succinate 50 mg.  He has continued to use nicotine products with dipping.  He is now using CPAP therapy for his obstructive sleep apnea.  With his ongoing sensation of increased heart rate  I have recommended further titration of Toprol XL to 75 mg.  He is blood pressure is normal today and he has been taking hydrochlorothiazide daily.  There are no signs of edema.  I have suggested he reduce this to every other day.  I strongly recommended discontinuance of nicotine products for which he is currently dipping 1/2 cans/day.  I again discussed the importance of his continued CPAP use.  We discussed untreated sleep apnea being associated with nocturnal palpitations, PACs, PVCs in addition to increased risk for atrial fibrillation.  He will monitor his blood pressure.  He continues to be on simvastatin 20 mg for hyperlipidemia.  He is on Topamax for his migraine headaches.  His GERD is controlled with Dexilant.  He will return to see Jerrel Ivory, NP for his primary care.  I will see him in 6 months for cardiology reevaluation.     Medication Adjustments/Labs and Tests Ordered: Current medicines are reviewed at length with the patient today.  Concerns regarding medicines are outlined above.  Medication changes, Labs and Tests ordered today are listed in the Patient Instructions below. Patient Instructions  Medication Instructions:  Increase Metoprolol to 75 mg (1.5 tablets) daily Decrease HCTZ to every other  day. If you need a refill on your cardiac medications before your next appointment, please call your pharmacy.     Follow-Up: At [facility], you and your health needs are our priority.  As part of our continuing mission to provide you with exceptional heart care, we have created designated Provider Care Teams.  These Care Teams include your primary Cardiologist (physician) and Advanced Practice Providers (APPs -  Physician Assistants and Nurse Practitioners) who all work together to provide you with the care you need, when you need it. You will need a follow up appointment in 6 months.  Please call our office 2 months in advance to schedule this appointment.  You may see Shelva Majestic, MD or one of the following Advanced Practice Providers on your designated Care Team: [place], [place] . Fabian Sharp, PA-C        Signed, Shelva Majestic, MD  06/27/2018 9:13 AM    [facility] [address] [place], [place], [place]  [address] Phone: [phone]

## 2018-06-27 ENCOUNTER — Encounter: Payer: Self-pay | Admitting: Cardiovascular Disease

## 2018-07-02 DIAGNOSIS — R42 Dizziness and giddiness: Secondary | ICD-10-CM | POA: Diagnosis not present

## 2018-07-16 ENCOUNTER — Other Ambulatory Visit: Payer: Self-pay | Admitting: Allergy

## 2018-07-26 ENCOUNTER — Other Ambulatory Visit: Payer: Self-pay | Admitting: *Deleted

## 2018-07-26 DIAGNOSIS — K219 Gastro-esophageal reflux disease without esophagitis: Secondary | ICD-10-CM

## 2018-07-26 MED ORDER — DEXLANSOPRAZOLE 60 MG PO CPDR: 1.0000 | DELAYED_RELEASE_CAPSULE | Freq: Every day | ORAL | 0 refills | Status: DC

## 2018-07-26 NOTE — Telephone Encounter (Signed)
Refill sent in

## 2018-07-28 ENCOUNTER — Encounter: Payer: Self-pay | Admitting: Family Medicine

## 2018-07-28 ENCOUNTER — Other Ambulatory Visit: Payer: Self-pay

## 2018-07-28 ENCOUNTER — Ambulatory Visit: Payer: 59 | Admitting: Family Medicine

## 2018-07-28 VITALS — BP 118/78 | HR 100 | Temp 99.0°F | Ht 69.0 in | Wt 199.0 lb

## 2018-07-28 DIAGNOSIS — J3089 Other allergic rhinitis: Secondary | ICD-10-CM | POA: Diagnosis not present

## 2018-07-28 DIAGNOSIS — J019 Acute sinusitis, unspecified: Secondary | ICD-10-CM | POA: Diagnosis not present

## 2018-07-28 MED ORDER — GUAIFENESIN ER 600 MG PO TB12: 600.0000 mg | ORAL_TABLET | Freq: Two times a day (BID) | ORAL | 0 refills | Status: DC | PRN

## 2018-07-28 MED ORDER — PREDNISONE 10 MG PO TABS: ORAL_TABLET | ORAL | 0 refills | Status: DC

## 2018-07-28 NOTE — Assessment & Plan Note (Signed)
Severe symptoms followed by ENT on antihistamine and Timmothy Sours

## 2018-07-28 NOTE — Progress Notes (Signed)
BP 118/78 (BP Location: Left Arm, Patient Position: Sitting, Cuff Size: Large)   Pulse 100   Temp 99 F (37.2 C) (Oral)   Ht 5\' 9"  (1.753 m)   Wt 199 lb (90.3 kg)   SpO2 97%   BMI 29.39 kg/m    CC: cough Subjective:    Patient ID: Ronald Burton, male    DOB: 26-Oct-1970, 48 y.o.   MRN: [id number]  HPI: Ronald Burton is a 48 y.o. male presenting on 07/28/2018 for Cough (C/o cough, scrathy throat, sinus pressure, drainage and HA. Sxs started 07/25/18. Tried Dayquil, barely helpful.  Pt accompanied by his wife, Misty Stanley. )   3d h/o scratchy throat, head congestion, PNDrainage, rhinorrhea. Cough productive of yellow to white mucous. Cough keeping him up at night. Some earaches and muffled hearing. Sinus pressure headache. Sneezing, itchy watery eyes.   Treating with dayquil and vicks rub at night.   No fevers/chills, ear or tooth pain, dyspnea or wheezing. No significant body aches.   Had flu 1 month ago, did fully resolve.  H/o migraines.  H/o allergic rhinitis - on xhance, zyrtec, eye drops.  No sick contacts at home.  No smokers at home. No h/o asthma.      Relevant past medical, surgical, family and social history reviewed and updated as indicated. Interim medical history since our last visit reviewed. Allergies and medications reviewed and updated. Outpatient Medications Prior to Visit  Medication Sig Dispense Refill  . aspirin EC 81 MG tablet Take 81 mg by mouth daily at 12 noon.     . bismuth subsalicylate (PEPTO BISMOL) 262 MG/15ML suspension Take 30 mLs by mouth every 6 (six) hours as needed for indigestion.    . busPIRone (BUSPAR) 5 MG tablet Take 15 mg by mouth 2 (two) times daily.     . Carbinoxamine Maleate (RYVENT) 6 MG TABS Take 6 mg by mouth 2 (two) times daily. (Patient taking differently: Take 6 mg by mouth 2 (two) times daily as needed (allergies). ) 120 tablet 4  . cetirizine (ZYRTEC) 10 MG tablet Take 10 mg by mouth daily.    Marland Kitchen dexlansoprazole (DEXILANT) 60 MG  capsule Take 1 capsule (60 mg total) by mouth daily. 90 capsule 0  . diclofenac sodium (VOLTAREN) 1 % GEL Apply 2-4 g topically daily as needed (for knee pain).     . hydrochlorothiazide (HYDRODIURIL) 12.5 MG tablet Take 1 tablet (12.5 mg total) by mouth every other day. For blood pressure. 90 tablet 0  . ipratropium (ATROVENT) 0.03 % nasal spray Place 2 sprays into both nostrils 4 (four) times daily. 30 mL 5  . ketotifen (ZADITOR) 0.025 % ophthalmic solution Place 1 drop into both eyes 2 (two) times daily. 5 mL 5  . levalbuterol (XOPENEX HFA) 45 MCG/ACT inhaler TAKE 2 PUFFS BY MOUTH EVERY 6 HOURS AS NEEDED FOR WHEEZE 15 Inhaler 2  . levocetirizine (XYZAL) 2.5 MG/5ML solution Take 2.5 mg by mouth every evening.    . meclizine (ANTIVERT) 25 MG tablet Take 1 tablet (25 mg total) by mouth 3 (three) times daily as needed for dizziness. 30 tablet 0  . metoprolol succinate (TOPROL-XL) 50 MG 24 hr tablet Take 1.5 tablets (75 mg) daily. 30 tablet 8  . montelukast (SINGULAIR) 10 MG tablet TAKE 1 TABLET BY MOUTH EVERYDAY AT BEDTIME 30 tablet 3  . nicotine polacrilex (NICORETTE) 4 MG gum Take 1 each (4 mg total) by mouth as needed for smoking cessation. 100 tablet 0  . ondansetron (ZOFRAN  ODT) 4 MG disintegrating tablet Take 1 tablet (4 mg total) by mouth every 8 (eight) hours as needed for nausea or vomiting. 15 tablet 0  . promethazine (PHENERGAN) 25 MG tablet Take 1 tablet (25 mg total) by mouth every 6 (six) hours as needed for nausea or vomiting. 12 tablet 1  . ranitidine (ZANTAC) 150 MG tablet Take 1 tablet (150 mg total) by mouth 2 (two) times daily. 60 tablet 5  . simvastatin (ZOCOR) 20 MG tablet Take 20 mg by mouth every evening.     . topiramate (TOPAMAX) 25 MG tablet Take 25 mg by mouth daily as needed (at onset of headaches).     Marland Kitchen UNABLE TO FIND CPAP: At bedtime    . XHANCE 93 MCG/ACT EXHU BLOW TWO DOSES IN EACH NOSTRIL TWICE DAILY AS DIRECTED. 16 mL 0   No facility-administered medications  prior to visit.      Per HPI unless specifically indicated in ROS section below Review of Systems Objective:    BP 118/78 (BP Location: Left Arm, Patient Position: Sitting, Cuff Size: Large)   Pulse 100   Temp 99 F (37.2 C) (Oral)   Ht  (1.753 m)   Wt 199 lb (90.3 kg)   SpO2 97%   BMI 29.39 kg/m   Wt Readings from Last 3 Encounters:  07/28/18 199 lb (90.3 kg)  06/25/18 198 lb (89.8 kg)  06/08/18 194 lb 8 oz (88.2 kg)    Physical Exam Vitals signs and nursing note reviewed.  Constitutional:      General: He is not in acute distress.    Appearance: Normal appearance. He is well-developed.  HENT:     Head: Normocephalic and atraumatic.     Right Ear: Hearing, tympanic membrane, ear canal and external ear normal.     Left Ear: Hearing, tympanic membrane, ear canal and external ear normal.     Nose: Mucosal edema and congestion present. No rhinorrhea.     Right Sinus: No maxillary sinus tenderness or frontal sinus tenderness.     Left Sinus: No maxillary sinus tenderness or frontal sinus tenderness.     Mouth/Throat:     Mouth: Mucous membranes are moist.     Pharynx: Uvula midline. Posterior oropharyngeal erythema present. No oropharyngeal exudate.     Tonsils: No tonsillar abscesses.  Eyes:     General: No scleral icterus.    Conjunctiva/sclera: Conjunctivae normal.     Pupils: Pupils are equal, round, and reactive to light.  Neck:     Musculoskeletal: Normal range of motion and neck supple.  Cardiovascular:     Rate and Rhythm: Normal rate and regular rhythm.     Pulses: Normal pulses.     Heart sounds: Normal heart sounds. No murmur.  Pulmonary:     Effort: Pulmonary effort is normal. No respiratory distress.     Breath sounds: Normal breath sounds. No wheezing, rhonchi or rales.     Comments: Lungs clear Lymphadenopathy:     Cervical: No cervical adenopathy.  Skin:    General: Skin is warm and dry.     Findings: No rash.  Neurological:     Mental Status:  He is alert.       Assessment & Plan:   Problem List Items Addressed This Visit    Allergic rhinitis    Severe symptoms followed by ENT on antihistamine and Xhance      Acute sinusitis - Primary    Anticipate viral given short duration as  well as allergic rhinitis flare. Will treat with low dose prednisone taper. Supportive care reviewed as per instructions. Red flags suggestive of bacterial infection reviewed, advised to let us know if these symptoms develop for abx course.      Relevant Medications   predniSONE (DELTASONE) 10 MG tablet   guaiFENesin (MUCINEX) 600 MG 12 hr tablet       Meds ordered this encounter  Medications  . predniSONE (DELTASONE) 10 MG tablet    Sig: Take three tablets for 2 days followed by two tablets for 2 days followed by one tablet for 2 days    Dispense:  12 tablet    Refill:  0  . guaiFENesin (MUCINEX) 600 MG 12 hr tablet    Sig: Take 1 tablet (600 mg total) by mouth 2 (two) times daily as needed for cough or to loosen phlegm.    Dispense:  20 tablet    Refill:  0   No orders of the defined types were placed in this encounter.   Patient Instructions  You have a sinus infection, likely viral, in conjunction with allergy flare.  Continue allergy medicines.  Take medicine as prescribed: prednisone taper.  Push fluids and plenty of rest. Nasal saline irrigation or neti pot to help drain sinuses. May use plain mucinex (guaifenesin) with plenty of fluid to help mobilize mucous. If fever >101, worsening productive cough, worsening facial pain or ongoing symptoms past 7-10 days, let us know - these are signs of bacterial infection.    Follow up plan: No follow-ups on file.  Eustaquio Boyden, MD

## 2018-07-28 NOTE — Patient Instructions (Signed)
You have a sinus infection, likely viral, in conjunction with allergy flare.  Continue allergy medicines.  Take medicine as prescribed: prednisone taper.  Push fluids and plenty of rest. Nasal saline irrigation or neti pot to help drain sinuses. May use plain mucinex (guaifenesin) with plenty of fluid to help mobilize mucous. If fever >101, worsening productive cough, worsening facial pain or ongoing symptoms past 7-10 days, let us know - these are signs of bacterial infection.

## 2018-07-28 NOTE — Assessment & Plan Note (Addendum)
Anticipate viral given short duration as well as allergic rhinitis flare. Will treat with low dose prednisone taper. Supportive care reviewed as per instructions. Red flags suggestive of bacterial infection reviewed, advised to let us know if these symptoms develop for abx course.

## 2018-08-03 ENCOUNTER — Telehealth: Payer: Self-pay

## 2018-08-03 ENCOUNTER — Other Ambulatory Visit: Payer: Self-pay | Admitting: Allergy

## 2018-08-03 MED ORDER — PREDNISONE 10 MG PO TABS: ORAL_TABLET | ORAL | 0 refills | Status: DC

## 2018-08-03 MED ORDER — GUAIFENESIN-CODEINE 100-10 MG/5ML PO SYRP: 5.0000 mL | ORAL_SOLUTION | Freq: Three times a day (TID) | ORAL | 0 refills | Status: DC | PRN

## 2018-08-03 NOTE — Telephone Encounter (Signed)
Patient called to give an update on his symptoms. Patient was seen on 07/28/2018 and was diagnosed with Allergic Rhinitis and Sinusitis. Patient was started on Prednisone and Mucinex. Patient is still taking Mucinex 2 times daily but he finished Prednisone on 08/01/2018. Symptoms were resolved while on medications. Now he is feeling symptoms coming back again but not as severe as when he was in the office at this time. He states he was told to call with an update if there are any issues. He is still using saline nasal spray, Flonase spray, Singulair daily also. Started to feel symptoms yesterday-08/02/2018. Has sore throat sharp, irritated sensation, runny nose, post nasal drip, some sneezing present, off and on ear pain but not severe, coughing up yellowish phlegm, sore chest from coughing so much. No fever, no headache, no head pressure, no body aches, no chills, no SOB. Advised patient will let Dr. Sharen Hones know to see how to proceed. CB: [phone], uses [facility] in [place] just in case.

## 2018-08-03 NOTE — Telephone Encounter (Signed)
May do another prednisone course as well as codeine cough syrup for night time. Let us know if worsening cough, fever >101, or persistent sinus pressure pain that is localizing to one side of face.

## 2018-08-03 NOTE — Telephone Encounter (Signed)
Spoke with pt relaying Dr. G's message. Pt verbalizes understanding.  

## 2018-08-04 ENCOUNTER — Telehealth: Payer: Self-pay | Admitting: Allergy

## 2018-08-04 MED ORDER — FLUTICASONE PROPIONATE 93 MCG/ACT NA EXHU: 2.0000 | INHALANT_SUSPENSION | Freq: Two times a day (BID) | NASAL | 5 refills | Status: DC

## 2018-08-04 NOTE — Telephone Encounter (Signed)
Wife called in and stated they tried to get [facility] filled and the pharmacy told them they were sending a PA and she just wanted to make sure a PA was received.  Ronald Burton would like a call back at [phone].

## 2018-08-04 NOTE — Telephone Encounter (Signed)
Spoke with wife and re-sent prescription. Advised that as soon as we receive the fax for a PA it will be completed. Patient's wife advised understanding.

## 2018-08-05 NOTE — Telephone Encounter (Signed)
Followed up on PA for [facility] and the PA has already been approved for 1 year in January of 2020. Called and informed patient's wife and informed. Patient's wife verbalized understanding and stated that she will talk with them and inform.

## 2018-08-09 ENCOUNTER — Other Ambulatory Visit: Payer: Self-pay | Admitting: *Deleted

## 2018-08-09 DIAGNOSIS — E7849 Other hyperlipidemia: Secondary | ICD-10-CM

## 2018-08-09 MED ORDER — SIMVASTATIN 20 MG PO TABS: 20.0000 mg | ORAL_TABLET | Freq: Every evening | ORAL | 0 refills | Status: DC

## 2018-08-09 NOTE — Telephone Encounter (Signed)
Please notify patient that he will be due for CPE in mid to late June 2020.  Please schedule. Refill sent to pharmacy.

## 2018-08-09 NOTE — Telephone Encounter (Signed)
Patient called stating that he needs a refill sent in by Mayra Reel NP for his cholesterol medication Simvastatin. Patient stated that this will be a new script by Jae Dire. [facility]acy - [facility] [place]

## 2018-08-16 ENCOUNTER — Telehealth: Payer: Self-pay | Admitting: Primary Care

## 2018-08-16 NOTE — Telephone Encounter (Signed)
Please call patient and schedule appointment.

## 2018-08-16 NOTE — Telephone Encounter (Signed)
Called to schedule a physical in June. No answer and no voicemail.

## 2018-08-18 ENCOUNTER — Other Ambulatory Visit: Payer: Self-pay | Admitting: Primary Care

## 2018-08-18 ENCOUNTER — Telehealth: Payer: Self-pay

## 2018-08-18 DIAGNOSIS — I10 Essential (primary) hypertension: Secondary | ICD-10-CM

## 2018-08-18 NOTE — Telephone Encounter (Signed)
Yes please refill.   [place] a version of ketotifen.   If it is covered we can refill the ketotifen

## 2018-08-18 NOTE — Telephone Encounter (Signed)
[facility]:  Pharmacy asking for refill of [place] drops, pt has no record of [facility] giving him these drops.  Pt has always received ketotifen. Should [facility] refill the [place] drops or the Ketotifen Please advise

## 2018-08-19 MED ORDER — KETOTIFEN FUMARATE 0.025 % OP SOLN: 1.0000 [drp] | Freq: Two times a day (BID) | OPHTHALMIC | 5 refills | Status: AC

## 2018-08-19 NOTE — Telephone Encounter (Signed)
Medication sent to pharmacy  

## 2018-08-22 ENCOUNTER — Emergency Department: Payer: No Typology Code available for payment source

## 2018-08-22 ENCOUNTER — Encounter: Payer: Self-pay | Admitting: *Deleted

## 2018-08-22 ENCOUNTER — Other Ambulatory Visit: Payer: Self-pay

## 2018-08-22 ENCOUNTER — Emergency Department
Admission: EM | Admit: 2018-08-22 | Discharge: 2018-08-22 | Disposition: A | Discharge status: Home / Self Care | Payer: No Typology Code available for payment source | Attending: Student in an Organized Health Care Education/Training Program | Admitting: Student in an Organized Health Care Education/Training Program

## 2018-08-22 DIAGNOSIS — I10 Essential (primary) hypertension: Secondary | ICD-10-CM | POA: Diagnosis not present

## 2018-08-22 DIAGNOSIS — F17220 Nicotine dependence, chewing tobacco, uncomplicated: Secondary | ICD-10-CM | POA: Diagnosis not present

## 2018-08-22 DIAGNOSIS — Z7982 Long term (current) use of aspirin: Secondary | ICD-10-CM | POA: Diagnosis not present

## 2018-08-22 DIAGNOSIS — R002 Palpitations: Secondary | ICD-10-CM | POA: Insufficient documentation

## 2018-08-22 DIAGNOSIS — R0789 Other chest pain: Secondary | ICD-10-CM | POA: Diagnosis not present

## 2018-08-22 DIAGNOSIS — I499 Cardiac arrhythmia, unspecified: Secondary | ICD-10-CM | POA: Diagnosis not present

## 2018-08-22 DIAGNOSIS — R069 Unspecified abnormalities of breathing: Secondary | ICD-10-CM | POA: Diagnosis not present

## 2018-08-22 DIAGNOSIS — R079 Chest pain, unspecified: Secondary | ICD-10-CM | POA: Diagnosis not present

## 2018-08-22 DIAGNOSIS — R0602 Shortness of breath: Secondary | ICD-10-CM | POA: Diagnosis not present

## 2018-08-22 DIAGNOSIS — Z79899 Other long term (current) drug therapy: Secondary | ICD-10-CM | POA: Diagnosis not present

## 2018-08-22 DIAGNOSIS — R791 Abnormal coagulation profile: Secondary | ICD-10-CM | POA: Diagnosis not present

## 2018-08-22 LAB — TROPONIN I: Troponin I: <0.03 ng/mL (ref <0.03)

## 2018-08-22 LAB — FIBRIN DERIVATIVES D-DIMER (ARMC ONLY): Fibrin derivatives D-dimer (ARMC): 772 ng/mL (FEU) — ABNORMAL HIGH (ref 0.00–499.00)

## 2018-08-22 LAB — COMPREHENSIVE METABOLIC PANEL
ALT: 25 U/L (ref 0–44)
AST: 21 U/L (ref 15–41)
Albumin: 4.2 g/dL (ref 3.5–5.0)
Alkaline Phosphatase: 81 U/L (ref 38–126)
Anion gap: 10 (ref 5–15)
BUN: 11 mg/dL (ref 6–20)
CO2: 24 mmol/L (ref 22–32)
Calcium: 9.6 mg/dL (ref 8.9–10.3)
Chloride: 103 mmol/L (ref 98–111)
Creatinine, Ser: 0.94 mg/dL (ref 0.61–1.24)
GFR calc Af Amer: >60 mL/min (ref >60)
GFR calc non Af Amer: >60 mL/min (ref >60)
Glucose, Bld: 116 mg/dL — ABNORMAL HIGH (ref 70–99)
Potassium: 3.1 mmol/L — ABNORMAL LOW (ref 3.5–5.1)
Sodium: 137 mmol/L (ref 135–145)
Total Bilirubin: 1 mg/dL (ref 0.3–1.2)
Total Protein: 7.9 g/dL (ref 6.5–8.1)

## 2018-08-22 LAB — CBC
HCT: 45.2 % (ref 39.0–52.0)
Hemoglobin: 15.6 g/dL (ref 13.0–17.0)
MCH: 28.1 pg (ref 26.0–34.0)
MCHC: 34.5 g/dL (ref 30.0–36.0)
MCV: 81.4 fL (ref 80.0–100.0)
Platelets: 279 10*3/uL (ref 150–400)
RBC: 5.55 MIL/uL (ref 4.22–5.81)
RDW: 13.4 % (ref 11.5–15.5)
WBC: 12.4 10*3/uL — ABNORMAL HIGH (ref 4.0–10.5)
nRBC: 0 % (ref 0.0–0.2)

## 2018-08-22 MED ORDER — LORAZEPAM 1 MG PO TABS
1.0000 mg | ORAL_TABLET | Freq: Once | ORAL | Status: AC
  Administered 2018-08-22: 1 mg via ORAL
  Filled 2018-08-22: qty 1

## 2018-08-22 MED ORDER — IOHEXOL 300 MG/ML  SOLN: 75.0000 mL | Freq: Once | INTRAMUSCULAR | Status: DC | PRN

## 2018-08-22 MED ORDER — IOHEXOL 350 MG/ML SOLN
75.0000 mL | Freq: Once | INTRAVENOUS | Status: AC | PRN
  Administered 2018-08-22: 75 mL via INTRAVENOUS

## 2018-08-22 NOTE — ED Triage Notes (Signed)
Per EMS report, patient c/o palpitations after taking generic Lexapro for first time this a.m. Patient also c/o chest pressure that was relieved with NItro spray. Patient was given 324mg  Aspirin en route and 4mg  Zofran for nausea. Patient arrived to ED alert, calm, NAD.

## 2018-08-22 NOTE — ED Notes (Signed)
Patient report that pain was increasing in mid-sternal area and heart rate increased to 122bpm. Patient was able to relax and deep breathe and pulse came down to 106.

## 2018-08-22 NOTE — ED Provider Notes (Signed)
[facility] Emergency Department Provider Note    First MD Initiated Contact with Patient 08/22/18 1506     (approximate)  I have reviewed the triage vital signs and the nursing notes.   HISTORY  Chief Complaint Palpitations    HPI Ronald Burton is a 48 y.o. male Blose listed past medical history presents the ER for evaluation of chest pain palpitations and shortness of breath.  States he is been having intermittent symptoms over the past week that is steadily worsened.  States he has some discomfort when he takes a deep breath.  Denies any measured fevers.  States he is followed by cardiology and has had issues with palpitations and chest discomfort in the past.  Denies any worsening pain with exertion.  No diaphoresis.  States it felt  dissimilar from previous panic attacks.    Past Medical History:  Diagnosis Date  . Anxiety   . Arthritis    knees  . BPH (benign prostatic hyperplasia)   . Complication of anesthesia    DIFFICULTY WAKING AFTER SINUS SURGERY  . Depression   . Deviated septum   . GERD (gastroesophageal reflux disease)   . Headache   . Hyperlipidemia   . Hypertension   . Irregular heart beat   . Migraines   . OSA (obstructive sleep apnea)    does not use CPAP  . Palpitations   . Seasonal allergies   . Tinnitus   . Urine incontinence    Family History  Problem Relation Age of Onset  . Arthritis Mother   . Hyperlipidemia Mother   . Hypertension Mother   . Stroke Mother   . Diabetes Mother   . Stomach cancer Mother   . Hyperlipidemia Father   . Heart disease Father 850  . Hypertension Father    Past Surgical History:  Procedure Laterality Date  . ETHMOIDECTOMY Bilateral 08/04/2017   Procedure: BILATERAL ETHMOIDECTOMY WITH TISSUE REMOVAL;  Surgeon: Newman Pieseoh, Su, MD;  Location: [facility];  Service: ENT;  Laterality: Bilateral;  . EXCISION CHONCHA BULLOSA Left 08/04/2017   Procedure: LEFT CONCHA BULLOSA RESECTION;   Surgeon: Newman Pieseoh, Su, MD;  Location: [facility];  Service: ENT;  Laterality: Left;  . FRONTAL SINUS EXPLORATION Bilateral 08/04/2017   Procedure: BILATERAL FRONTAL RECESS SINUS EXPLORATION;  Surgeon: Newman Pieseoh, Su, MD;  Location: [facility];  Service: ENT;  Laterality: Bilateral;  . LEFT HEART CATH AND CORONARY ANGIOGRAPHY N/A 10/16/2017   Procedure: LEFT HEART CATH AND CORONARY ANGIOGRAPHY;  Surgeon: Marykay LexHarding, David W, MD;  [facility];  Service: Cardiovascular;  Laterality: N/A;  . MAXILLARY ANTROSTOMY Bilateral 08/04/2017   Procedure: BILATERAL MAXILLARY ANTROSTOMY WITH TISSUE REMOVAL;  Surgeon: Newman Pieseoh, Su, MD;  Location: [facility];  Service: ENT;  Laterality: Bilateral;  . NASAL SEPTOPLASTY W/ TURBINOPLASTY N/A 08/04/2017   Procedure: NASAL SEPTOPLASTY WITH TURBINATE REDUCTION;  Surgeon: Newman Pieseoh, Su, MD;  Location: [facility];  Service: ENT;  Laterality: N/A;  . SINOSCOPY    . SINUS ENDO WITH FUSION Bilateral 08/04/2017   Procedure: SINUS ENDO WITH FUSION NAVIGATION;  Surgeon: Newman Pieseoh, Su, MD;  Location:  SURGERY CENTER;  Service: ENT;  Laterality: Bilateral;  . SPHENOIDECTOMY Bilateral 08/04/2017   Procedure: BILATERAL SPHENOIDECTOMY WITH TISSUE REMOVAL;  Surgeon: Newman Pieseoh, Su, MD;  Location:  SURGERY CENTER;  Service: ENT;  Laterality: Bilateral;   Patient Active Problem List   Diagnosis Date Noted  . Acute sinusitis 07/28/2018  .  Palpitations 06/08/2018  . Dizziness 06/08/2018  . Prediabetes 01/20/2018  . Chronic pain of both knees 10/19/2017  . Unstable angina (HCC)   . Chest pain 10/15/2017  . OSA (obstructive sleep apnea) 10/15/2017  . Tobacco dependence 10/15/2017  . Recurrent sinusitis 06/11/2017  . Allergic rhinitis 12/24/2016  . Hyperlipidemia 12/24/2016  . Preventative health care 12/24/2016  . Essential hypertension 02/27/2016  . BPH (benign prostatic hyperplasia) 02/27/2016  . Arthralgia 02/27/2016   . GERD (gastroesophageal reflux disease) 02/27/2016      Prior to Admission medications   Medication Sig Start Date End Date Taking? Authorizing Provider  aspirin EC 81 MG tablet Take 81 mg by mouth daily at 12 noon.     [provider]  bismuth subsalicylate (PEPTO BISMOL) 262 MG/15ML suspension Take 30 mLs by mouth every 6 (six) hours as needed for indigestion.    [provider]  busPIRone (BUSPAR) 5 MG tablet Take 15 mg by mouth 2 (two) times daily.     [provider]  Carbinoxamine Maleate (RYVENT) 6 MG TABS Take 6 mg by mouth 2 (two) times daily. Patient taking differently: Take 6 mg by mouth 2 (two) times daily as needed (allergies).  03/04/18   Marcelyn Bruins, MD  cetirizine (ZYRTEC) 10 MG tablet Take 10 mg by mouth daily.    [provider]  dexlansoprazole (DEXILANT) 60 MG capsule Take 1 capsule (60 mg total) by mouth daily. 07/26/18   Marcelyn Bruins, MD  diclofenac sodium (VOLTAREN) 1 % GEL Apply 2-4 g topically daily as needed (for knee pain).  06/27/16   [provider]  Fluticasone Propionate (XHANCE) 93 MCG/ACT EXHU Place 2 sprays into the nose 2 (two) times daily. 08/04/18   Marcelyn Bruins, MD  guaiFENesin (MUCINEX) 600 MG 12 hr tablet Take 1 tablet (600 mg total) by mouth 2 (two) times daily as needed for cough or to loosen phlegm. 07/28/18   Eustaquio Boyden, MD  guaiFENesin-codeine [facility]) 100-10 MG/5ML syrup Take 5 mLs by mouth 3 (three) times daily as needed for cough (sedation precautions). 08/03/18   Eustaquio Boyden, MD  hydrochlorothiazide (HYDRODIURIL) 12.5 MG tablet Take 1 tablet (12.5 mg total) by mouth every other day. For blood pressure. 06/25/18   Lennette Bihari, MD  ipratropium (ATROVENT) 0.03 % nasal spray Place 2 sprays into both nostrils 4 (four) times daily. 11/19/17   Marcelyn Bruins, MD  ketotifen (ZADITOR) 0.025 % ophthalmic solution Place 1 drop into both eyes 2  (two) times daily. 08/19/18   Marcelyn Bruins, MD  levalbuterol [facility]) 45 MCG/ACT inhaler TAKE 2 PUFFS BY MOUTH EVERY 6 HOURS AS NEEDED FOR WHEEZE 05/14/18   Padgett, Pilar Grammes, MD  levocetirizine (XYZAL) 2.5 MG/5ML solution Take 2.5 mg by mouth every evening.    [provider]  meclizine (ANTIVERT) 25 MG tablet Take 1 tablet (25 mg total) by mouth 3 (three) times daily as needed for dizziness. 06/08/18   Doreene Nest, NP  metoprolol succinate (TOPROL-XL) 50 MG 24 hr tablet Take 1.5 tablets (75 mg) daily. 06/25/18   Lennette Bihari, MD  montelukast (SINGULAIR) 10 MG tablet TAKE 1 TABLET BY MOUTH EVERYDAY AT BEDTIME 05/10/18   Marcelyn Bruins, MD  nicotine polacrilex (NICORETTE) 4 MG gum Take 1 each (4 mg total) by mouth as needed for smoking cessation. 10/19/17   Doreene Nest, NP  ondansetron (ZOFRAN ODT) 4 MG disintegrating tablet Take 1 tablet (4 mg total) by  mouth every 8 (eight) hours as needed for nausea or vomiting. 06/08/18   Doreene Nest, NP  predniSONE (DELTASONE) 10 MG tablet Take three tablets for 2 days followed by two tablets for 2 days followed by one tablet for 2 days 08/03/18   Eustaquio Boyden, MD  promethazine (PHENERGAN) 25 MG tablet Take 1 tablet (25 mg total) by mouth every 6 (six) hours as needed for nausea or vomiting. 01/14/17   Vanetta Mulders, MD  ranitidine (ZANTAC) 150 MG tablet Take 1 tablet (150 mg total) by mouth 2 (two) times daily. 11/19/17   Marcelyn Bruins, MD  simvastatin (ZOCOR) 20 MG tablet Take 1 tablet (20 mg total) by mouth every evening. For cholesterol. 08/09/18   Doreene Nest, NP  topiramate (TOPAMAX) 25 MG tablet Take 25 mg by mouth daily as needed (at onset of headaches).     [provider]  UNABLE TO FIND CPAP: At bedtime    [provider]  Timmothy Sours 93 MCG/ACT EXHU BLOW TWO DOSES IN EACH NOSTRIL TWICE DAILY AS DIRECTED. 08/03/18   Marcelyn Bruins, MD     Allergies Paxil [paroxetine]    Social History Social History   Tobacco Use  . Smoking status: Never Smoker  . Smokeless tobacco: Current User    Types: Chew  . Tobacco comment: he does need a patch  Substance Use Topics  . Alcohol use: Not Currently    Comment: social  . Drug use: No    Review of Systems Patient denies headaches, rhinorrhea, blurry vision, numbness, shortness of breath, chest pain, edema, cough, abdominal pain, nausea, vomiting, diarrhea, dysuria, fevers, rashes or hallucinations unless otherwise stated above in HPI. ____________________________________________   PHYSICAL EXAM:  VITAL SIGNS: Vitals:   08/22/18 1500 08/22/18 1530  BP: (!) 154/96 (!) 143/92  Pulse: (!) 114 97  Resp: 19 16  Temp:    SpO2: 96% 91%    Constitutional: Alert and oriented.  Eyes: Conjunctivae are normal.  Head: Atraumatic. Nose: No congestion/rhinnorhea. Mouth/Throat: Mucous membranes are moist.   Neck: No stridor. Painless ROM.  Cardiovascular: Normal rate, regular rhythm. Grossly normal heart sounds.  Good peripheral circulation. Respiratory: Normal respiratory effort.  No retractions. Lungs CTAB. Gastrointestinal: Soft and nontender. No distention. No abdominal bruits. No CVA tenderness. Genitourinary:  Musculoskeletal: No lower extremity tenderness nor edema.  No joint effusions. Neurologic:  Normal speech and language. No gross focal neurologic deficits are appreciated. No facial droop Skin:  Skin is warm, dry and intact. No rash noted. Psychiatric: Mood and affect are normal. Speech and behavior are normal.  ____________________________________________   LABS (all labs ordered are listed, but only abnormal results are displayed)  Results for orders placed or performed during the hospital encounter of 08/22/18 (from the past 24 hour(s))  CBC     Status: Abnormal   Collection Time: 08/22/18  2:52 PM  Result Value Ref Range   WBC 12.4 (H) 4.0 - 10.5 K/uL    RBC 5.55 4.22 - 5.81 MIL/uL   Hemoglobin 15.6 13.0 - 17.0 g/dL   HCT 16.1 09.6 - 04.5 %   MCV 81.4 80.0 - 100.0 fL   MCH 28.1 26.0 - 34.0 pg   MCHC 34.5 30.0 - 36.0 g/dL   RDW 40.9 81.1 - 91.4 %   Platelets 279 150 - 400 K/uL   nRBC 0.0 0.0 - 0.2 %  Comprehensive metabolic panel     Status: Abnormal   Collection Time: 08/22/18  2:52 PM  Result Value Ref Range   Sodium 137 135 - 145 mmol/L   Potassium 3.1 (L) 3.5 - 5.1 mmol/L   Chloride 103 98 - 111 mmol/L   CO2 24 22 - 32 mmol/L   Glucose, Bld 116 (H) 70 - 99 mg/dL   BUN 11 6 - 20 mg/dL   Creatinine, Ser 8.29 0.61 - 1.24 mg/dL   Calcium 9.6 8.9 - 93.7 mg/dL   Total Protein 7.9 6.5 - 8.1 g/dL   Albumin 4.2 3.5 - 5.0 g/dL   AST 21 15 - 41 U/L   ALT 25 0 - 44 U/L   Alkaline Phosphatase 81 38 - 126 U/L   Total Bilirubin 1.0 0.3 - 1.2 mg/dL   GFR calc non Af Amer >60 >60 mL/min   GFR calc Af Amer >60 >60 mL/min   Anion gap 10 5 - 15  Troponin I - ONCE - STAT     Status: None   Collection Time: 08/22/18  2:52 PM  Result Value Ref Range   Troponin I <0.03 <0.03 ng/mL  Fibrin derivatives D-Dimer ([facility] only)     Status: Abnormal   Collection Time: 08/22/18  2:52 PM  Result Value Ref Range   Fibrin derivatives D-dimer ([facility]) 772.00 (H) 0.00 - 499.00 ng/mL (FEU)   ____________________________________________  EKG My review and personal interpretation at Time: 14:44   Indication: palpitations  Rate: 90  Rhythm: sinus Axis: normal Other: normal intervals, no stemi ____________________________________________  RADIOLOGY  I personally reviewed all radiographic images ordered to evaluate for the above acute complaints and reviewed radiology reports and findings.  These findings were personally discussed with the patient.  Please see medical record for radiology report.  ____________________________________________   PROCEDURES  Procedure(s) performed:  Procedures    Critical Care performed: no  ____________________________________________   INITIAL IMPRESSION / ASSESSMENT AND PLAN / ED COURSE  Pertinent labs & imaging results that were available during my care of the patient were reviewed by me and considered in my medical decision making (see chart for details).   DDX: Dysrhythmia, ACS, PE, dissection, pneumonia, pneumothorax, electrolyte abnormality, panic attack  Ronald Burton is a 48 y.o. who presents to the ED with symptoms as described above.  Patient well-appearing nontoxic does appear mildly anxious.  Patient low risk by Anner Crete but is little bit tachycardic therefore will send d-dimer to further stratify.  EKG shows sinus rhythm with no abnormal intervals and no evidence of ischemia based on duration of his symptoms with a normal troponin this does not seem clinically consistent with ACS.  Heat score is 2.  The patient will be placed on continuous pulse oximetry and telemetry for monitoring.  Laboratory evaluation will be sent to evaluate for the above complaints.     Clinical Course as of Aug 22 1827  Sun Aug 22, 2018  1718 D-dimer is elevated.  Will further evaluate with CTA.   [PR]  1824 Sound and CTA reassuring.  Remains hemodynamically stable.  No hypoxia.  Dysrhythmia on cardiac monitor.  At this point do believe patient stable and appropriate for outpatient follow-up.   [PR]    Clinical Course User Index [PR] Willy Eddy, MD    The patient was evaluated in Emergency Department today for the symptoms described in the history of present illness. He/she was evaluated in the context of the global COVID-19 pandemic, which necessitated consideration that the patient might be at risk for infection with the SARS-CoV-2 virus that causes COVID-19. Institutional protocols and  algorithms that pertain to the evaluation of patients at risk for COVID-19 are in a state of rapid change based on information released by regulatory bodies including the CDC and federal and state  organizations. These policies and algorithms were followed during the patient's care in the ED.  As part of my medical decision making, I reviewed the following data within the electronic MEDICAL RECORD NUMBER Nursing notes reviewed and incorporated, Labs reviewed, notes from prior ED visits and [place] Controlled Substance Database   ____________________________________________   FINAL CLINICAL IMPRESSION(S) / ED DIAGNOSES  Final diagnoses:  Palpitations  Chest pain, unspecified type      NEW MEDICATIONS STARTED DURING THIS VISIT:  New Prescriptions   No medications on file     Note:  This document was prepared using Dragon voice recognition software and may include unintentional dictation errors.    Willy Eddy, MD 08/22/18 [phone]

## 2018-08-22 NOTE — ED Notes (Signed)
Patient taken to CT scan.

## 2018-08-23 ENCOUNTER — Telehealth: Payer: Self-pay | Admitting: *Deleted

## 2018-08-23 ENCOUNTER — Encounter: Payer: Self-pay | Admitting: Physician Assistant

## 2018-08-23 ENCOUNTER — Telehealth: Payer: 59 | Admitting: Physician Assistant

## 2018-08-23 DIAGNOSIS — R000 Tachycardia, unspecified: Secondary | ICD-10-CM

## 2018-08-23 DIAGNOSIS — R0602 Shortness of breath: Secondary | ICD-10-CM

## 2018-08-23 DIAGNOSIS — R42 Dizziness and giddiness: Secondary | ICD-10-CM

## 2018-08-23 MED ORDER — ONDANSETRON 4 MG PO TBDP: 4.0000 mg | ORAL_TABLET | Freq: Three times a day (TID) | ORAL | 0 refills | Status: DC | PRN

## 2018-08-23 NOTE — Progress Notes (Signed)
Based on what you shared with me, I feel your condition warrants further evaluation and I recommend that you be seen for a face to face office visit.  Mr. Kuczek,  Based of your answers and based on review of your chart (recent ER visit for palpitations), it has been recommended that you follow up with your Cardiologist. If you have not been able to follow up with them, my suggestion would be to go to the ER for further workup of your symptoms.     NOTE: If you entered your credit card information for this eVisit, you will not be charged. You may see a "hold" on your card for the $35 but that hold will drop off and you will not have a charge processed.  If you are having a true medical emergency please call 911.  If you need an urgent face to face visit, [facility] has four urgent care centers for your convenience.    PLEASE NOTE: THE [facility] LOCATIONS AND URGENT CARE CLINICS DO NOT HAVE THE TESTING FOR CORONAVIRUS COVID19 AVAILABLE.  IF YOU FEEL YOU NEED THIS TEST YOU MUST GO TO A TRIAGE LOCATION AT ONE OF THE HOSPITAL EMERGENCY DEPARTMENTS   [email or web address] to reserve your spot online an avoid wait times  [facility] [address] [place], [place] [address] Modified hours of operation: Monday-Friday, 10 AM to 6 PM  Saturday & Sunday 10 AM to 4 PM *Across the street from [employer]  [employer] (New Address!) [address], [address] [place], [place] [address] *Just off [employer], across the road from [place]* Modified hours of operation: Monday-Friday, 10 AM to 5 PM  Closed Saturday & Sunday   The following sites will take your insurance:  . [facility]  [phone] Get Driving Directions Find a Provider at this Location  [address] [place], [place] [address] . 10 am to 8 pm Monday-Friday . 12 pm to 8 pm Saturday-Sunday   . [facility]   [phone] Get Driving Directions Find a Provider at this Location  [address], [address] [place], [place] [address] . 8 am to 8 pm Monday-Friday . 9 am to 6 pm Saturday . 11 am to 6 pm Sunday   . [facility] at [facility]  [phone] Get Driving Directions  [address] [place], [place] [address] . 8 am to 8 pm Monday-Friday . 8 am to 4 pm Saturday-Sunday   Your e-visit answers were reviewed by a board certified advanced clinical practitioner to complete your personal care plan.  Thank you for using e-Visits. I have spent 7 min in completion and review of this note- Illa Level [facility]

## 2018-08-23 NOTE — Telephone Encounter (Signed)
Spoke with patient regarding symptoms, will treat nausea.  Overall he's doing better. He will continue to try to get in touch with his cardiologist. Shirlee Limerick, can you provide the patient with phone numbers to Dr. Evlyn Clines office?

## 2018-08-23 NOTE — Telephone Encounter (Signed)
Spoken to patient and notifed Graylon Gunning comments. He stated that he did try to call Dr Landry Dyke office but just busy signal. Patient is having a lot of nausea, high BP reading and heart fluttering.  I also tried as well to Dr Landry Dyke office at [phone]. Still busy signal.

## 2018-08-23 NOTE — Telephone Encounter (Signed)
Noted, chart reviewed. I also reviewed the message he sent to his cardiologist via my chart.  I agree that he needs a Holter Monitor for further evaluation of his symptoms. Has he tried calling the cardiology office? There should be someone who can get his message to Dr. Tresa Endo.

## 2018-08-23 NOTE — Telephone Encounter (Signed)
Patient left a voicemail stating that he went tot he ER last night with heart flutters. Patient stated that he had tried to get in touch with his cardiologist, but wants to discuss with Mayra Reel NP.  See chart looks like patient is in process of doing e-visit.

## 2018-08-24 ENCOUNTER — Telehealth: Payer: Self-pay | Admitting: Cardiovascular Disease

## 2018-08-24 ENCOUNTER — Telehealth: Payer: Self-pay

## 2018-08-24 NOTE — Telephone Encounter (Signed)
Received mychart message- patient was advised to do a virtual visit with Dr.Kelly on Friday.  Patient verbalized understanding.  Scheduled for 04/17

## 2018-08-24 NOTE — Telephone Encounter (Signed)
Patient was schedule for virtual visit with Dr.Kelly on Friday 04/17 to discuss.

## 2018-08-24 NOTE — Telephone Encounter (Signed)
Patient is calling to report that he was in the ER on Sunday for palpitations and chest pain. Ronald Burton is wondering if he should wear a heart monitor like was discussed in last visit.
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
## 2018-08-24 NOTE — Telephone Encounter (Signed)
Returned call to patient.He stated palpitations are worse waking him up in mornings.He went to ED this past Sunday.Dr.advised he might need to wear a monitor.Stated he increased Toprol to 1&1/2 tablets ( 75 mg ) daily 06/25/18.He has decreased caffeine.Advised I will send message to [facility] for advice.

## 2018-08-26 ENCOUNTER — Telehealth: Payer: Self-pay | Admitting: Cardiovascular Disease

## 2018-08-26 NOTE — Telephone Encounter (Addendum)
Smartphone/ my chart/ virtual consent/ pre reg completed °

## 2018-08-27 ENCOUNTER — Encounter: Payer: Self-pay | Admitting: Cardiovascular Disease

## 2018-08-27 ENCOUNTER — Telehealth (INDEPENDENT_AMBULATORY_CARE_PROVIDER_SITE_OTHER): Payer: 59 | Admitting: Cardiovascular Disease

## 2018-08-27 VITALS — BP 112/84 | HR 78 | Ht 69.0 in

## 2018-08-27 DIAGNOSIS — G4733 Obstructive sleep apnea (adult) (pediatric): Secondary | ICD-10-CM | POA: Diagnosis not present

## 2018-08-27 DIAGNOSIS — Z8669 Personal history of other diseases of the nervous system and sense organs: Secondary | ICD-10-CM

## 2018-08-27 DIAGNOSIS — I10 Essential (primary) hypertension: Secondary | ICD-10-CM

## 2018-08-27 DIAGNOSIS — R002 Palpitations: Secondary | ICD-10-CM | POA: Diagnosis not present

## 2018-08-27 DIAGNOSIS — R0789 Other chest pain: Secondary | ICD-10-CM

## 2018-08-27 DIAGNOSIS — F419 Anxiety disorder, unspecified: Secondary | ICD-10-CM

## 2018-08-27 DIAGNOSIS — K219 Gastro-esophageal reflux disease without esophagitis: Secondary | ICD-10-CM

## 2018-08-27 DIAGNOSIS — E876 Hypokalemia: Secondary | ICD-10-CM

## 2018-08-27 MED ORDER — METOPROLOL SUCCINATE ER 50 MG PO TB24: ORAL_TABLET | ORAL | 1 refills | Status: DC

## 2018-08-27 NOTE — Progress Notes (Signed)
Virtual Visit via Video Note   This visit type was conducted due to national recommendations for restrictions regarding the COVID-19 Pandemic (e.g. social distancing) in an effort to limit this patient's exposure and mitigate transmission in our community.  Due to his co-morbid illnesses, this patient is at least at moderate risk for complications without adequate follow up.  This format is felt to be most appropriate for this patient at this time.  All issues noted in this document were discussed and addressed.  A limited physical exam was performed with this format.  Please refer to the patient's chart for his consent to telehealth for [facility].   Evaluation Performed:  Follow-up visit  Date:  08/27/2018   ID:  Ronald Burton, DOB 06-08-1970, MRN [id number]  Patient Location: Home Provider Location: Home  PCP:  Pleas Koch, NP  Cardiologist:  Shelva Majestic, MD  Electrophysiologist:  None   Chief Complaint: Follow-up office visit, recent ER evaluation August 22, 2018  History of Present Illness:    Ronald Burton is a 48 y.o. male who has a history of hypertension for migraine headaches, panic attacks, experiences episodes of chest pain which he describes as chest burning, at times her chest tightness and more often are a sharp knifelike sensation.  He has experienced palpitations off and on for several months and oftentimes wakes up in the middle of night with palpitations.  These palpitations may last 10-15 minutes.  He admits to having significant sinus issues.  He was told of having sleep apnea in 2016 while living in [place].  He has a CPAP machine but when I had seen him initially, he was not using CPAP and admitted to snoring and intermittently waking up gasping for breath.   He  presented to the emergency room with a feeling that his heart was beating out of his chest and also with some sharp chest pain.  He was tachycardic with a pulse up to 100.  ECG showed early  repolarization.  I saw for initial cardiology evaluation 03/05/2017.  I reviewed his prior laboratory and recommended magnesium, TSH and free T4 checked which were normal.  An echo Doppler study on 03/12/2017 showed an ejection fraction of 60-65% with mild LVH.  He had normal diastolic function.  He had normal valvular architecture. He initially felt significant improved since initiating metoprolol and is now taking this 25 mg twice a day.  He is unaware of any recurrent palpitations.   I recommended that he reinstitute his CPAP therapy and discussed potential adverse consequences of untreated sleep apnea particularly with reference to his hypertension as well as nocturnal palpitations and other related issues.  I discussed the importance of avoiding pseudoephedrine preparations.  In June 2019, he had undergone cardiac catheterization after he had presented with recurrent chest pain symptomatology.  Catheterization performed by Dr. Ellyn Hack revealed entirely normal coronary arteries and normal LV function.  Previously, he was on lisinopril HCT but when I last saw him in February 2020 he was no longer on lisinopril but has been on HCTZ 12.5 mg for the past 3 weeks and also on Toprol-XL 50 mg daily.  He has been on simvastatin for hyperlipidemia.  He has been on BuSpar.  He states he has continued to use his CPAP.  He has GERD.  At times he continues to have some blood pressure lability.  He is still dipping tobacco, previously 1 can/day more recently 1/2 can/day.  He presents for evaluation.  During that evaluation,  due to The sensation of increased heart rates, I recommended further titration of Toprol-XL to 75 mg daily.  During that evaluation his blood pressure was normal and I suggested that since there were no signs of edema that he reduce HCTZ and take this 12.5 mg only every other day.  Since I saw him, he states last week he had noticed his heart rate was increasing fairly consistently.  His diet was  somewhat poor.  He was more anxious.  He ultimately presented to the emergency room on August 22, 2018 (Easter Sunday) with complaints of increased heart rate, anxiety, waking up with increased heartbeat and palpitations.  He also had some atypical sharp pain.  A d-dimer was minimally positive.  A chest CT was negative for PE and lower extremity duplex evaluation was negative for DVT.  Of note, chemistry revealed a potassium of 3.1.  He was not given any supplementation.  He states he has been using CPAP every night with 100% compliance.  He typically has been sleeping well with CPAP and this has helped with some nocturnal palpitations.  Over the past several days, he notes his blood pressure has improved, although when he had presented to the emergency room his blood pressure was elevated at 154/96 initially and on repeat was 147/92 with heart rates at 114 and at 97.  He presents now for follow-up evaluation   The patient does not have symptoms concerning for COVID-19 infection (fever, chills, cough, or new shortness of breath).    Past Medical History:  Diagnosis Date   Anxiety    Arthritis    knees   BPH (benign prostatic hyperplasia)    Complication of anesthesia    DIFFICULTY WAKING AFTER SINUS SURGERY   Depression    Deviated septum    GERD (gastroesophageal reflux disease)    Headache    Hyperlipidemia    Hypertension    Irregular heart beat    Migraines    OSA (obstructive sleep apnea)    does not use CPAP   Palpitations    Seasonal allergies    Tinnitus    Urine incontinence    Past Surgical History:  Procedure Laterality Date   ETHMOIDECTOMY Bilateral 08/04/2017   Procedure: BILATERAL ETHMOIDECTOMY WITH TISSUE REMOVAL;  Surgeon: Leta Baptist, MD;  Location: [place];  Service: ENT;  Laterality: Bilateral;   EXCISION CHONCHA BULLOSA Left 08/04/2017   Procedure: LEFT CONCHA BULLOSA RESECTION;  Surgeon: Leta Baptist, MD;  Location: [place];  Service: ENT;  Laterality: Left;   FRONTAL SINUS EXPLORATION Bilateral 08/04/2017   Procedure: BILATERAL FRONTAL RECESS SINUS EXPLORATION;  Surgeon: Leta Baptist, MD;  Location: [place];  Service: ENT;  Laterality: Bilateral;   LEFT HEART CATH AND CORONARY ANGIOGRAPHY N/A 10/16/2017   Procedure: LEFT HEART CATH AND CORONARY ANGIOGRAPHY;  Surgeon: Leonie Man, MD;  Location: [facility];  Service: Cardiovascular;  Laterality: N/A;   MAXILLARY ANTROSTOMY Bilateral 08/04/2017   Procedure: BILATERAL MAXILLARY ANTROSTOMY WITH TISSUE REMOVAL;  Surgeon: Leta Baptist, MD;  Location: [place];  Service: ENT;  Laterality: Bilateral;   NASAL SEPTOPLASTY W/ TURBINOPLASTY N/A 08/04/2017   Procedure: NASAL SEPTOPLASTY WITH TURBINATE REDUCTION;  Surgeon: Leta Baptist, MD;  Location: [place];  Service: ENT;  Laterality: N/A;   SINOSCOPY     SINUS ENDO WITH FUSION Bilateral 08/04/2017   Procedure: SINUS ENDO WITH FUSION NAVIGATION;  Surgeon: Leta Baptist, MD;  Location: [facility];  Service: ENT;  Laterality: Bilateral;   SPHENOIDECTOMY Bilateral 08/04/2017   Procedure: BILATERAL SPHENOIDECTOMY WITH TISSUE REMOVAL;  Surgeon: Leta Baptist, MD;  Location: [place];  Service: ENT;  Laterality: Bilateral;     Current Meds  Medication Sig   aspirin EC 81 MG tablet Take 81 mg by mouth daily at 12 noon.    busPIRone (BUSPAR) 5 MG tablet Take 15 mg by mouth 2 (two) times daily.    cetirizine (ZYRTEC) 10 MG tablet Take 10 mg by mouth daily.   dexlansoprazole (DEXILANT) 60 MG capsule Take 1 capsule (60 mg total) by mouth daily.   diclofenac sodium (VOLTAREN) 1 % GEL Apply 2-4 g topically daily as needed (for knee pain).    Famotidine (PEPCID AC PO) Take by mouth as needed.   Fluticasone Propionate (XHANCE) 93 MCG/ACT EXHU Place 2 sprays into the nose 2 (two) times daily.   guaiFENesin (MUCINEX) 600 MG 12 hr tablet Take 1  tablet (600 mg total) by mouth 2 (two) times daily as needed for cough or to loosen phlegm.   guaiFENesin-codeine (CHERATUSSIN AC) 100-10 MG/5ML syrup Take 5 mLs by mouth 3 (three) times daily as needed for cough (sedation precautions).   hydrochlorothiazide (HYDRODIURIL) 12.5 MG tablet Take 1 tablet (12.5 mg total) by mouth every other day. For blood pressure.   ipratropium (ATROVENT) 0.03 % nasal spray Place 2 sprays into both nostrils 4 (four) times daily.   ketotifen (ZADITOR) 0.025 % ophthalmic solution Place 1 drop into both eyes 2 (two) times daily.   metoprolol succinate (TOPROL-XL) 50 MG 24 hr tablet Take 1.5 tablets (75 mg) daily.   montelukast (SINGULAIR) 10 MG tablet TAKE 1 TABLET BY MOUTH EVERYDAY AT BEDTIME   nicotine polacrilex (NICORETTE) 4 MG gum Take 1 each (4 mg total) by mouth as needed for smoking cessation.   ondansetron (ZOFRAN ODT) 4 MG disintegrating tablet Take 1 tablet (4 mg total) by mouth every 8 (eight) hours as needed for nausea or vomiting.   simvastatin (ZOCOR) 20 MG tablet Take 1 tablet (20 mg total) by mouth every evening. For cholesterol.   topiramate (TOPAMAX) 25 MG tablet Take 25 mg by mouth daily as needed (at onset of headaches).    UNABLE TO FIND CPAP: At bedtime   XHANCE 93 MCG/ACT EXHU BLOW TWO DOSES IN EACH NOSTRIL TWICE DAILY AS DIRECTED.   [DISCONTINUED] bismuth subsalicylate (PEPTO BISMOL) 262 MG/15ML suspension Take 30 mLs by mouth every 6 (six) hours as needed for indigestion.   [DISCONTINUED] ranitidine (ZANTAC) 150 MG tablet Take 1 tablet (150 mg total) by mouth 2 (two) times daily.     Allergies:   Paxil [paroxetine]   Social History   Tobacco Use   Smoking status: Never Smoker   Smokeless tobacco: Current User    Types: Chew   Tobacco comment: he does need a patch  Substance Use Topics   Alcohol use: Not Currently    Comment: social   Drug use: No     Family Hx: The patient's family history includes Arthritis in  his mother; Diabetes in his mother; Heart disease (age of onset: 41) in his father; Hyperlipidemia in his father and mother; Hypertension in his father and mother; Stomach cancer in his mother; Stroke in his mother.  ROS:   Please see the history of present illness.    Positive for palpitations, positive for migraines, positive for obstructive sleep apnea.  Positive for hypertension.  No leg swelling.  He denies pulmonary symptoms.Positive for GERD which he takes Dexilant.  Positive for apnea as of a statin.  Positive for anxiety on BuSpar.  No exertional chest tightness or pressure All other systems reviewed and are negative.   Prior CV studies:   The following studies were reviewed today:  CARDIAC CATH 10/16/2017  Angiographically normal coronary arteries  The left ventricular systolic function is normal. The left ventricular ejection fraction is 55-65% by visual estimate.  LV end diastolic pressure is mildly elevated.  There is no mitral valve regurgitation.  There is no aortic valve stenosis.    Angiographically normal coronary arteries  Normal LV function with mildly elevated LVEDP.  Likely nonischemic chest pain  Venous Doppler LE: 08/22/2018   MPRESSION: No evidence of deep venous thrombosis in either lower extremity.  CTA CHEST: 08/22/2018 IMPRESSION: Negative chest CTA.   Labs/Other Tests and Data Reviewed:    EKG:  An ECG dated 06/25/2018 was personally reviewed today and demonstrated: Normal sinus rhythm at 89 bpm.  No significant ST-T abnormalities.  No ectopy.  QTc interval normal at 415 ms.  Recent Labs: 06/08/2018: TSH 1.00 08/22/2018: ALT 25; BUN 11; Creatinine, Ser 0.94; Hemoglobin 15.6; Platelets 279; Potassium 3.1; Sodium 137   Recent Lipid Panel Lab Results  Component Value Date/Time   CHOL 131 10/16/2017 03:08 AM   TRIG 90 10/16/2017 03:08 AM   HDL 31 (L) 10/16/2017 03:08 AM   CHOLHDL 4.2 10/16/2017 03:08 AM   LDLCALC 82 10/16/2017 03:08 AM     Wt Readings from Last 3 Encounters:  08/22/18 195 lb (88.5 kg)  07/28/18 199 lb (90.3 kg)  06/25/18 198 lb (89.8 kg)     Objective:    Vital Signs:  BP 112/84    Pulse 78    Ht _0  (1.753 m)    BMI 28.80 kg/m    Well-developed African-American male in no acute distress. Breathing was normal and not labored HEENT appeared unremarkable. There did not appear to be any neck vein distention. There was no audible wheezing. When asked to palpate his chest there was no pressure or tenderness There was no abdominal pain to palpation There was no swelling Neurologically he appeared intact without focal abnormalities He had normal affect and cognition  ASSESSMENT & PLAN:    1. Palpitations: Most recently, Mr. Arlan Organ has been on Toprol-XL 75 mg daily but he has been taking a quarter of a pill after lunch and a whole pill at bedtime.  He has been taking HCTZ 12.5 mg every other day.  For the week prior to his ER evaluation he experienced increasing episodes of increased heart rate.  His potassium was low at 3.1 which may have contributed to some potential ectopy.  He admitted to being anxious at times.  Presently, his blood pressure and heart rate are better controlled.  However, I have recommended he change his metoprolol succinate and take 50 mg in the morning and 50 mg 12 hours later.  This should give him more adequate coverage.  We also discussed this is a once a day medication which he could also take 100 milligrams daily.  If he continues to experience palpitations and increased heart rate, I have suggested that once we are back in able to a telemetry evaluations he will let us know and we could always set him up for a 2-week event monitor for assessment of heart rate and potential ectopy. 2. Essential hypertension: I reviewed his hospital records from Easter Sunday.  Blood pressure was elevated at 154/96.  He was anxious and he was tachycardic.  His chest CT was negative for PE.  There  was no DVT.  By increasing Toprol-XL to 100 mg daily this should improve his blood pressure.  If blood pressure continues to be elevated he may benefit possibly from the addition of ACE inhibition or ARB therapy.  He has no signs of leg swelling in particular with his low serum I will not increase his diuretic regimen presently. 3. Hypokalemia: Potassium was 3.1 at his August 22, 2018 ER evaluation.  He admits that his diet was poor that week.  I discussed potassium containing foods.  I will recheck a be met and I will also check a magnesium level next week.  If potassium remains low he will be started on K-Dur 20 milliequivalents the days he takes HCTZ. 4. Obstructive sleep apnea: He now admits to 1% CPAP use.  He is sleeping better.  His nocturnal palpitations have improved. 5. History of migraines: On Topamax.  Relatively stable presently. 6. GERD: On Dexilant.  Not having any recent increasing symptomatology 7. Anxiety: On BuSpar.  COVID-19 Education: The signs and symptoms of COVID-19 were discussed with the patient and how to seek care for testing (follow up with PCP or arrange E-visit).  The importance of social distancing was discussed today.  Time:   Today, I have spent 30 minutes with the patient with telehealth technology discussing the above problems.     Medication Adjustments/Labs and Tests Ordered: Current medicines are reviewed at length with the patient today.  Concerns regarding medicines are outlined above.   Tests Ordered: No orders of the defined types were placed in this encounter.   Medication Changes: No orders of the defined types were placed in this encounter.   Disposition:  Follow up 2 months  Signed, Shelva Majestic, MD  08/27/2018 9:00 AM    [place]

## 2018-08-27 NOTE — Patient Instructions (Signed)
Medication Instructions:  Increase Metoprolol to 50 mg in the morning and 50 mg at bedtime If you need a refill on your cardiac medications before your next appointment, please call your pharmacy.   Lab work: BMET, MAG in 1 week. If you have labs (blood work) drawn today and your tests are completely normal, you will receive your results only by: Marland Kitchen MyChart Message (if you have MyChart) OR . A paper copy in the mail If you have any lab test that is abnormal or we need to change your treatment, we will call you to review the results.  Follow-Up: At [facility], you and your health needs are our priority.  As part of our continuing mission to provide you with exceptional heart care, we have created designated Provider Care Teams.  These Care Teams include your primary Cardiologist (physician) and Advanced Practice Providers (APPs -  Physician Assistants and Nurse Practitioners) who all work together to provide you with the care you need, when you need it. You will need a follow up appointment in 2-3 months. You may see Nicki Guadalajara, MD or one of the following Advanced Practice Providers on your designated Care Team: [place], [place] . Micah Flesher, PA-C  Any Other Special Instructions Will Be Listed Below (If Applicable). Please monitor if palpitations continue to occur with the increase medication and let us know.

## 2018-08-30 ENCOUNTER — Telehealth: Payer: Self-pay | Admitting: Cardiovascular Disease

## 2018-08-30 NOTE — Telephone Encounter (Signed)
LVM to call and schedule 3 month Dr. Tresa Endo followup.

## 2018-09-09 ENCOUNTER — Other Ambulatory Visit: Payer: Self-pay | Admitting: Cardiovascular Disease

## 2018-09-09 DIAGNOSIS — I10 Essential (primary) hypertension: Secondary | ICD-10-CM

## 2018-09-09 NOTE — Telephone Encounter (Signed)
HTCZ refilled.

## 2018-09-18 ENCOUNTER — Other Ambulatory Visit: Payer: Self-pay | Admitting: Allergy

## 2018-10-13 MED ORDER — METOPROLOL SUCCINATE ER 50 MG PO TB24: ORAL_TABLET | ORAL | 3 refills | Status: DC

## 2018-10-22 ENCOUNTER — Other Ambulatory Visit: Payer: Self-pay | Admitting: Primary Care

## 2018-10-22 DIAGNOSIS — I10 Essential (primary) hypertension: Secondary | ICD-10-CM

## 2018-10-22 DIAGNOSIS — E7849 Other hyperlipidemia: Secondary | ICD-10-CM

## 2018-10-22 DIAGNOSIS — R7303 Prediabetes: Secondary | ICD-10-CM

## 2018-10-25 ENCOUNTER — Other Ambulatory Visit (INDEPENDENT_AMBULATORY_CARE_PROVIDER_SITE_OTHER): Payer: 59

## 2018-10-25 DIAGNOSIS — R7303 Prediabetes: Secondary | ICD-10-CM

## 2018-10-25 DIAGNOSIS — I10 Essential (primary) hypertension: Secondary | ICD-10-CM

## 2018-10-25 DIAGNOSIS — E7849 Other hyperlipidemia: Secondary | ICD-10-CM | POA: Diagnosis not present

## 2018-10-25 LAB — LIPID PANEL
Cholesterol: 167 mg/dL (ref 0–200)
HDL: 43 mg/dL (ref >39.00)
LDL Cholesterol: 99 mg/dL (ref 0–99)
NonHDL: 124.39
Total CHOL/HDL Ratio: 4
Triglycerides: 125 mg/dL (ref 0.0–149.0)
VLDL: 25 mg/dL (ref 0.0–40.0)

## 2018-10-25 LAB — BASIC METABOLIC PANEL
BUN: 11 mg/dL (ref 6–23)
CO2: 26 mEq/L (ref 19–32)
Calcium: 9.4 mg/dL (ref 8.4–10.5)
Chloride: 101 mEq/L (ref 96–112)
Creatinine, Ser: 1.09 mg/dL (ref 0.40–1.50)
GFR: 86.81 mL/min (ref >60.00)
Glucose, Bld: 105 mg/dL — ABNORMAL HIGH (ref 70–99)
Potassium: 3.7 mEq/L (ref 3.5–5.1)
Sodium: 137 mEq/L (ref 135–145)

## 2018-10-25 LAB — HEMOGLOBIN A1C: Hgb A1c MFr Bld: 6 % (ref 4.6–6.5)

## 2018-10-27 ENCOUNTER — Encounter: Payer: 59 | Admitting: Primary Care

## 2018-11-04 ENCOUNTER — Other Ambulatory Visit: Payer: Self-pay | Admitting: Primary Care

## 2018-11-04 DIAGNOSIS — E7849 Other hyperlipidemia: Secondary | ICD-10-CM

## 2018-11-09 ENCOUNTER — Telehealth: Payer: Self-pay | Admitting: Cardiovascular Disease

## 2018-11-09 NOTE — Telephone Encounter (Signed)
I called pt, no answer and no voicemail. °

## 2018-11-10 ENCOUNTER — Encounter: Payer: Self-pay | Admitting: Cardiovascular Disease

## 2018-11-10 ENCOUNTER — Other Ambulatory Visit: Payer: Self-pay

## 2018-11-10 ENCOUNTER — Ambulatory Visit (INDEPENDENT_AMBULATORY_CARE_PROVIDER_SITE_OTHER): Payer: 59 | Admitting: Cardiovascular Disease

## 2018-11-10 VITALS — BP 132/80 | HR 72 | Ht 69.0 in | Wt 203.8 lb

## 2018-11-10 DIAGNOSIS — Z8669 Personal history of other diseases of the nervous system and sense organs: Secondary | ICD-10-CM

## 2018-11-10 DIAGNOSIS — F419 Anxiety disorder, unspecified: Secondary | ICD-10-CM

## 2018-11-10 DIAGNOSIS — E876 Hypokalemia: Secondary | ICD-10-CM

## 2018-11-10 DIAGNOSIS — R002 Palpitations: Secondary | ICD-10-CM | POA: Diagnosis not present

## 2018-11-10 DIAGNOSIS — I10 Essential (primary) hypertension: Secondary | ICD-10-CM | POA: Diagnosis not present

## 2018-11-10 DIAGNOSIS — G4733 Obstructive sleep apnea (adult) (pediatric): Secondary | ICD-10-CM

## 2018-11-10 DIAGNOSIS — K219 Gastro-esophageal reflux disease without esophagitis: Secondary | ICD-10-CM | POA: Diagnosis not present

## 2018-11-10 DIAGNOSIS — E785 Hyperlipidemia, unspecified: Secondary | ICD-10-CM

## 2018-11-10 MED ORDER — HYDROCHLOROTHIAZIDE 12.5 MG PO TABS: 12.5000 mg | ORAL_TABLET | ORAL | 2 refills | Status: DC | PRN

## 2018-11-10 NOTE — Patient Instructions (Signed)
Medication Instructions:  Only use HCTZ medication for as needed- if BP goes above 130. If you need a refill on your cardiac medications before your next appointment, please call your pharmacy.   Follow-Up: At [facility], you and your health needs are our priority.  As part of our continuing mission to provide you with exceptional heart care, we have created designated Provider Care Teams.  These Care Teams include your primary Cardiologist (physician) and Advanced Practice Providers (APPs -  Physician Assistants and Nurse Practitioners) who all work together to provide you with the care you need, when you need it. You will need a follow up appointment in 6 months.  Please call our office 2 months in advance to schedule this appointment.  You may see Shelva Majestic, MD or one of the following Advanced Practice Providers on your designated Care Team: [place], [place] . Fabian Sharp, PA-C

## 2018-11-10 NOTE — Progress Notes (Signed)
Cardiology Office Note    Date:  11/10/2018   ID:  Ronald Burton, DOB Nov 13, 1970, MRN [id number]  PCP:  Pleas Koch, NP  Cardiologist:  Shelva Majestic, MD   No chief complaint on file.  F/U initially referred by Dr. Sherwood Gambler for evaluation of palpitations and atypical chest pain  History of Present Illness:  Ronald Burton is a 48 y.o. male who presents for a 44-monthfollow-up evaluation following his most recent telemedicine video visit for palpitations.   Mr. HHinnenkamphas a history of hypertension for at least 6 years.  He also has a history of migraine headaches, panic attacks, experience episodes of chest pain which he describes as chest burning, at times her chest tightness and more often are a sharp knifelike sensation.  He has experienced palpitations off and on for several months and oftentimes wakes up in the middle of night with palpitations.  These palpitations may last 10-15 minutes.  He admits to having significant sinus issues.  He is sleep is poor.  He apparently was told of having sleep [place]  He has a CPAP machine but has not been using it.  He admits to snoring and at times wakes up gasping for breath.  [place] was [place].  He  presented to the emergency room with a feeling that his heart was beating out of his chest and also with some sharp chest pain.  He was tachycardic with a pulse up to 100.  ECG showed early repolarization.  I saw for initial cardiology evaluation 03/05/2017.  I reviewed his prior laboratory and recommended magnesium, TSH and free T4 checked which were normal.  An echo Doppler study on 03/12/2017 showed an ejection fraction of 60-65% with mild LVH.  He had normal diastolic function.  He had normal valvular architecture.  He felt significant improved since initiating metoprolol and is now taking this 25 mg twice a day.  He is unaware of any recurrent  palpitations.  When I last saw him, I recommended that he reinstitute his CPAP therapy and discussed potential adverse consequences of untreated sleep apnea particularly with reference to his hypertension as well as nocturnal palpitations and other related issues.  I discussed the importance of avoiding pseudoephedrine preparations.  He reinstituted CPAP therapy and his sleeping significantly improved.  He has had several evaluations over the past year and was hospitalized overnight in June, and was reevaluated again in the emergency room in November 2019 and January 2020.  He saw KJerrel Ivorynurse practitioner in follow-up of his most recent ER evaluation.  He still notes at times his chest pounding at night.  I last saw him in the office setting in February 2020. Since I saw him, he  had noticed his heart rate was increasing fairly consistently.  His diet was somewhat poor.  He was more anxious.  He ultimately presented to the emergency room on August 22, 2018 (Easter Sunday) with complaints of increased heart rate, anxiety, waking up with increased heartbeat and palpitations.  He also had some atypical sharp pain.  A d-dimer was minimally positive.  A chest CT was negative for PE and lower extremity duplex evaluation was negative for DVT.  Of note, chemistry revealed a potassium of 3.1.  He was not given any supplementation.  He states he has been using CPAP every night with 100% compliance.  He typically has been sleeping well  with CPAP and this has helped with some nocturnal palpitations. I evaluated him in a video telemedicine visit on August 27, 2018. Over the past several days, he noted his blood pressure has improved, although when he had presented to the emergency room his blood pressure was elevated at 154/96 initially and on repeat was 147/92 with heart rates at 114 and at 97.  During that evaluation, I recommended further titration of metoprolol succinate to 50 mg twice a day for both improved control  of palpitations as well as blood pressure.  On this therapy, he has noticed resolution of prior palpitations.  Most of the time his blood pressure has been stable but at times it still may get into the 299M with diastolics in the mid to upper 80s.  He has been trying to reduce his sodium intake.  He continues to have occasional migraine headaches which is followed at the [facility].  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  He continues to use CPAP with 100% compliance and is averaging 7-1/2 to 8 hours of sleep per night.  There has been resolution of his nocturnal palpitations.  He presents for evaluation.  Past Medical History:  Diagnosis Date   Anxiety    Arthritis    knees   BPH (benign prostatic hyperplasia)    Complication of anesthesia    DIFFICULTY WAKING AFTER SINUS SURGERY   Depression    Deviated septum    GERD (gastroesophageal reflux disease)    Headache    Hyperlipidemia    Hypertension    Irregular heart beat    Migraines    OSA (obstructive sleep apnea)    does not use CPAP   Palpitations    Seasonal allergies    Tinnitus    Urine incontinence     Past Surgical History:  Procedure Laterality Date   ETHMOIDECTOMY Bilateral 08/04/2017   Procedure: BILATERAL ETHMOIDECTOMY WITH TISSUE REMOVAL;  Surgeon: Leta Baptist, MD;  Location: [facility];  Service: ENT;  Laterality: Bilateral;   EXCISION CHONCHA BULLOSA Left 08/04/2017   Procedure: LEFT CONCHA BULLOSA RESECTION;  Surgeon: Leta Baptist, MD;  Location: [facility];  Service: ENT;  Laterality: Left;   FRONTAL SINUS EXPLORATION Bilateral 08/04/2017   Procedure: BILATERAL FRONTAL RECESS SINUS EXPLORATION;  Surgeon: Leta Baptist, MD;  Location: [facility];  Service: ENT;  Laterality: Bilateral;   LEFT HEART CATH AND CORONARY ANGIOGRAPHY N/A 10/16/2017   Procedure: LEFT HEART CATH AND CORONARY ANGIOGRAPHY;  Surgeon: Leonie Man, MD;  Location: [facility];  Service: Cardiovascular;  Laterality: N/A;   MAXILLARY ANTROSTOMY Bilateral 08/04/2017   Procedure: BILATERAL MAXILLARY ANTROSTOMY WITH TISSUE REMOVAL;  Surgeon: Leta Baptist, MD;  Location: [facility];  Service: ENT;  Laterality: Bilateral;   NASAL SEPTOPLASTY W/ TURBINOPLASTY N/A 08/04/2017   Procedure: NASAL SEPTOPLASTY WITH TURBINATE REDUCTION;  Surgeon: Leta Baptist, MD;  Location: [facility];  Service: ENT;  Laterality: N/A;   SINOSCOPY     SINUS ENDO WITH FUSION Bilateral 08/04/2017   Procedure: SINUS ENDO WITH FUSION NAVIGATION;  Surgeon: Leta Baptist, MD;  Location: [facility];  Service: ENT;  Laterality: Bilateral;   SPHENOIDECTOMY Bilateral 08/04/2017   Procedure: BILATERAL SPHENOIDECTOMY WITH TISSUE REMOVAL;  Surgeon: Leta Baptist, MD;  Location: [facility];  Service: ENT;  Laterality: Bilateral;    Current Medications: Outpatient Medications Prior to Visit  Medication Sig Dispense Refill  aspirin EC 81 MG tablet Take 81 mg by mouth daily at 12 noon.      busPIRone (BUSPAR) 5 MG tablet Take 15 mg by mouth 2 (two) times daily.      cetirizine (ZYRTEC) 10 MG tablet Take 10 mg by mouth daily.     dexlansoprazole (DEXILANT) 60 MG capsule Take 1 capsule (60 mg total) by mouth daily. 90 capsule 0   diclofenac sodium (VOLTAREN) 1 % GEL Apply 2-4 g topically daily as needed (for knee pain).      Famotidine (PEPCID AC PO) Take by mouth as needed.     ipratropium (ATROVENT) 0.03 % nasal spray Place 2 sprays into both nostrils 4 (four) times daily. 30 mL 5   ketotifen (ZADITOR) 0.025 % ophthalmic solution Place 1 drop into both eyes 2 (two) times daily. 5 mL 5   levalbuterol (XOPENEX HFA) 45 MCG/ACT inhaler TAKE 2 PUFFS BY MOUTH EVERY 6 HOURS AS NEEDED FOR WHEEZE 15 Inhaler 2   metoprolol succinate (TOPROL-XL) 50 MG 24 hr tablet Take 50 mg in the morning and 50 mg in the evening 180 tablet 3   montelukast (SINGULAIR)  10 MG tablet TAKE 1 TABLET BY MOUTH EVERYDAY AT BEDTIME 30 tablet 3   nicotine polacrilex (NICORETTE) 4 MG gum Take 1 each (4 mg total) by mouth as needed for smoking cessation. 100 tablet 0   ondansetron (ZOFRAN ODT) 4 MG disintegrating tablet Take 1 tablet (4 mg total) by mouth every 8 (eight) hours as needed for nausea or vomiting. 15 tablet 0   simvastatin (ZOCOR) 20 MG tablet TAKE 1 TABLET (20 MG TOTAL) BY MOUTH EVERY EVENING. FOR CHOLESTEROL. 30 tablet 2   topiramate (TOPAMAX) 25 MG tablet Take 25 mg by mouth daily as needed (at onset of headaches).      UNABLE TO FIND CPAP: At bedtime     XHANCE 93 MCG/ACT EXHU BLOW TWO DOSES IN EACH NOSTRIL TWICE DAILY AS DIRECTED. 16 mL 2   hydrochlorothiazide (HYDRODIURIL) 12.5 MG tablet TAKE 1 TABLET (12.5 MG TOTAL) BY MOUTH EVERY OTHER DAY. FOR BLOOD PRESSURE. 30 tablet 2   Carbinoxamine Maleate (RYVENT) 6 MG TABS Take 6 mg by mouth 2 (two) times daily. (Patient not taking: Reported on 08/27/2018) 120 tablet 4   guaiFENesin (MUCINEX) 600 MG 12 hr tablet Take 1 tablet (600 mg total) by mouth 2 (two) times daily as needed for cough or to loosen phlegm. 20 tablet 0   guaiFENesin-codeine (CHERATUSSIN AC) 100-10 MG/5ML syrup Take 5 mLs by mouth 3 (three) times daily as needed for cough (sedation precautions). 120 mL 0   XHANCE 93 MCG/ACT EXHU BLOW 2 DOSES IN EACH NOSTRIL TWICE DAILY 16 mL 7   No facility-administered medications prior to visit.      Allergies:   Paxil [paroxetine]   Social History   Socioeconomic History   Marital status: Married    Spouse name: Not on file   Number of children: Not on file   Years of education: Not on file   Highest education level: Not on file  Occupational History   Occupation: Freight forwarder for [employer] resource strain: Not on file   Food insecurity    Worry: Not on file    Inability: Not on file   Transportation needs    Medical: Not on file    Non-medical: Not on  file  Tobacco Use   Smoking status: Never Smoker   Smokeless tobacco: Current User  Types: Chew   Tobacco comment: he does need a patch  Substance and Sexual Activity   Alcohol use: Not Currently    Comment: social   Drug use: No   Sexual activity: Not on file  Lifestyle   Physical activity    Days per week: Not on file    Minutes per session: Not on file   Stress: Not on file  Relationships   Social connections    Talks on phone: Not on file    Gets together: Not on file    Attends religious service: Not on file    Active member of club or organization: Not on file    Attends meetings of clubs or organizations: Not on file    Relationship status: Not on file  Other Topics Concern   Not on file  Social History Narrative   Married.   6 children, 1 grandchild.   Works as a Health and safety inspector   Enjoys spending time with family, video games.     Socially he is married.  He has 3 children and 2 grandchildren.  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  He completed 12th grade of education.  He chews tobacco.  He does not drink alcohol.  He does not routinely exercise.  Family History:  The patient's family history includes Arthritis in his mother; Diabetes in his mother; Heart disease (age of onset: 59) in his father; Hyperlipidemia in his father and mother; Hypertension in his father and mother; Stomach cancer in his mother; Stroke in his mother.  His mother 43, father 13.  He has one alive.  Brother at age 70, one brother died age 82.  He has 2 sisters and 3 children.  ROS General: Negative; No fevers, chills, or night sweats;  HEENT: Negative; No changes in vision or hearing, sinus congestion, difficulty swallowing Pulmonary: Negative; No cough, wheezing, shortness of breath, hemoptysis Cardiovascular: see HPI GI: Negative; No nausea, vomiting, diarrhea, or abdominal pain GU: Negative; No dysuria, hematuria, or difficulty  voiding Musculoskeletal: Negative; no myalgias, joint pain, or weakness Hematologic/Oncology: Negative; no easy bruising, bleeding Endocrine: Negative; no heat/cold intolerance; no diabetes Neuro: Positive for history of migraines and panic attacks. Skin: Negative; No rashes or skin lesions Psychiatric: Negative; No behavioral problems, depression Sleep: Positive for OSA, now with 100% compliance to CPAP.  Previous symptoms included snoring, nocturia, nonrestorative sleep and awakening gasping for breath.   Other comprehensive 14 point system review is negative.   PHYSICAL EXAM:   VS:  BP 132/80    Pulse 72    Ht _0  (1.753 m)    Wt 203 lb 12.8 oz (92.4 kg)    BMI 30.10 kg/m     Repeat blood pressure by me was 122/78  Wt Readings from Last 3 Encounters:  11/10/18 203 lb 12.8 oz (92.4 kg)  08/22/18 195 lb (88.5 kg)  07/28/18 199 lb (90.3 kg)     General: Alert, oriented, no distress.  Skin: normal turgor, no rashes, warm and dry HEENT: Normocephalic, atraumatic. Pupils equal round and reactive to light; sclera anicteric; extraocular muscles intact;  Nose without nasal septal hypertrophy Mouth/Parynx benign; Mallinpatti scale, previously assessed at 3 Neck: No JVD, no carotid bruits; normal carotid upstroke Lungs: clear to ausculatation and percussion; no wheezing or rales Chest wall: without tenderness to palpitation Heart: PMI not displaced, RRR, s1 s2 normal, 1/6 systolic murmur, no diastolic murmur, no rubs, gallops, thrills, or heaves Abdomen:  soft, nontender; no hepatosplenomehaly, BS+; abdominal aorta nontender and not dilated by palpation. Back: no CVA tenderness Pulses 2+ Musculoskeletal: full range of motion, normal strength, no joint deformities Extremities: no clubbing cyanosis or edema, Homan's sign negative  Neurologic: grossly nonfocal; Cranial nerves grossly wnl Psychologic: Normal mood and affect   Studies/Labs Reviewed:   EKG:  EKG is ordered today. ECG  (independently read by me): Normal sinus rhythm at 79 bpm.  No ectopy.  Normal intervals.  April 27, 2017 ECG (independently read by me): Normal sinus rhythm at 78 bpm.  No ectopy.  Normal intervals.  October 25, /2018 ECG (independently read by me): Normal sinus rhythm at 92 bpm.  Normal intervals.  No ectopy.  Recent Labs: BMP Latest Ref Rng & Units 10/25/2018 08/22/2018 06/05/2018  Glucose 70 - 99 mg/dL 105(H) 116(H) 139(H)  BUN 6 - 23 mg/dL _0 Creatinine 0.40 - 1.50 mg/dL 1.09 0.94 1.10  Sodium 135 - 145 mEq/L 137 137 134(L)  Potassium 3.5 - 5.1 mEq/L 3.7 3.1(L) 3.2(L)  Chloride 96 - 112 mEq/L 101 103 100  CO2 19 - 32 mEq/L _1 Calcium 8.4 - 10.5 mg/dL 9.4 9.6 9.6     Hepatic Function Latest Ref Rng & Units 08/22/2018 01/20/2018 01/14/2017  Total Protein 6.5 - 8.1 g/dL 7.9 7.0 7.0  Albumin 3.5 - 5.0 g/dL 4.2 4.1 4.1  AST 15 - 41 U/L _2 ALT 0 - 44 U/L _3 Alk Phosphatase 38 - 126 U/L 81 74 77  Total Bilirubin 0.3 - 1.2 mg/dL 1.0 1.3(H) 1.2    CBC Latest Ref Rng & Units 08/22/2018 06/05/2018 04/02/2018  WBC 4.0 - 10.5 K/uL 12.4(H) 12.1(H) 9.7  Hemoglobin 13.0 - 17.0 g/dL 15.6 15.8 16.0  Hematocrit 39.0 - 52.0 % 45.2 46.6 48.1  Platelets 150 - 400 K/uL 279 269 172   Lab Results  Component Value Date   MCV 81.4 08/22/2018   MCV 80.6 06/05/2018   MCV 83.2 04/02/2018   Lab Results  Component Value Date   TSH 1.00 06/08/2018   Lab Results  Component Value Date   HGBA1C 6.0 10/25/2018     BNP No results found for: BNP  ProBNP No results found for: PROBNP   Lipid Panel     Component Value Date/Time   CHOL 167 10/25/2018 0839   TRIG 125.0 10/25/2018 0839   HDL 43.00 10/25/2018 0839   CHOLHDL 4 10/25/2018 0839   VLDL 25.0 10/25/2018 0839   LDLCALC 99 10/25/2018 0839     RADIOLOGY: No results found.   Additional studies/ records that were reviewed today include:  I reviewed the emergency room records and the multiple evaluations he  has had since his last office visit with me in December 2018    ASSESSMENT:    1. Palpitations   2. Essential hypertension   3. OSA (obstructive sleep apnea)   4. Gastroesophageal reflux disease without esophagitis   5. Anxiety   6. History of migraine headaches   7. Hypokalemia   8. Hyperlipidemia, unspecified hyperlipidemia type     PLAN:  Mr. Opdahl is a 48 year old African-American male who has a  history of hypertension, a history of migraine headaches, panic attacks, and remotely had experienced episodes of chest pain for which he had undergone a stress test while living in [place], [place] which he states was normal.  He he had experienced increasing palpitations as well as episodes of sharp chest  pain as well as chest burning and some vague chest tightness.  His echo Doppler study revealed normal systolic and diastolic function.  He had been on lisinopril HCT in the past.  He had recently been evaluated in the emergency room on Easter with increased palpitations despite taking metoprolol 50 mg in the morning and 25 mg at night.  This occurred in the setting of hypokalemia with a potassium of 3.1.  Subsequent potassium level had increased to 3.7.  I reviewed his hospital records from Easter Sunday.  During that time he was anxious and tachycardic.  A CT was negative for PE.  There was no DVT.  I recommended further titration of Toprol-XL to 100 mg daily which I believe he may be taking 50 mg twice a day.  This has been very beneficial and now he denies any awareness of recurrent tachypalpitations.  His blood pressure is also improved with typical blood pressures around 532 with diastolics in the 99M.  His blood pressure on repeat by me today was excellent at 122/78.  He had not been taking his hydrochlorothiazide but at times when he noted his blood pressure at home to be elevated he had taken it every other day.  I have suggested that since he does not have any edema and his pressure  seems to be stable to not take this routinely but if he consistently notes his systolic blood pressure being 130 or greater to take HCTZ 12.5 mg on a given day.  He is now using CPAP with 100% compliance.  He is sleeping on average 7-1/2 to 8 hours of sleep per night.  He denies any nocturnal palpitations.  He is sleeping better and has more energy throughout the day.  He continues to have a prescription for Topamax for his migraine headache.  This is been given to him by the [place].  He was told that he should be taking this every day but has not been taking it.  He will follow-up with the [facility].  He continues to be on simvastatin for hyperlipidemia and is tolerating this well.  His GERD is relatively stable on Dexilant but he still takes Pepcid on an as-needed basis.  His anxiety is improved on BuSpar.  We discussed the importance of exercise and avoidance of nicotine products with his history of dipping.  He will monitor his weight, blood pressure, and potential for palpitations.  I will see him in 6 months for reevaluation  Time spent: 25 minutes  Medication Adjustments/Labs and Tests Ordered: Current medicines are reviewed at length with the patient today.  Concerns regarding medicines are outlined above.  Medication changes, Labs and Tests ordered today are listed in the Patient Instructions below. Patient Instructions  Medication Instructions:  Only use HCTZ medication for as needed- if BP goes above 130. If you need a refill on your cardiac medications before your next appointment, please call your pharmacy.   Follow-Up: At [facility], you and your health needs are our priority.  As part of our continuing mission to provide you with exceptional heart care, we have created designated Provider Care Teams.  These Care Teams include your primary Cardiologist (physician) and Advanced Practice Providers (APPs -  Physician Assistants and Nurse Practitioners) who all work together to provide you with the  care you need, when you need it. You will need a follow up appointment in 6 months.  Please call our office 2 months in advance to schedule this appointment.  You may see  Shelva Majestic, MD or one of the following Advanced Practice Providers on your designated Care Team: Almyra Deforest, PA-C  Fabian Sharp, [place]        Signed, Shelva Majestic, MD  11/10/2018 9:19 AM    [facility] [address], [place], [place], [place]  [address] Phone: [phone]

## 2018-11-18 ENCOUNTER — Telehealth: Payer: Self-pay | Admitting: Primary Care

## 2018-11-18 NOTE — Telephone Encounter (Signed)
Called to confirm 7/10 appt and do covid screening. No answer and no voicemail set up.

## 2018-11-19 ENCOUNTER — Other Ambulatory Visit: Payer: Self-pay

## 2018-11-19 ENCOUNTER — Encounter: Payer: Self-pay | Admitting: Primary Care

## 2018-11-19 ENCOUNTER — Ambulatory Visit (INDEPENDENT_AMBULATORY_CARE_PROVIDER_SITE_OTHER): Payer: 59 | Admitting: Primary Care

## 2018-11-19 VITALS — BP 120/82 | HR 82 | Temp 98.1°F | Ht 69.0 in | Wt 205.0 lb

## 2018-11-19 DIAGNOSIS — J3089 Other allergic rhinitis: Secondary | ICD-10-CM

## 2018-11-19 DIAGNOSIS — F17200 Nicotine dependence, unspecified, uncomplicated: Secondary | ICD-10-CM

## 2018-11-19 DIAGNOSIS — I10 Essential (primary) hypertension: Secondary | ICD-10-CM

## 2018-11-19 DIAGNOSIS — G4733 Obstructive sleep apnea (adult) (pediatric): Secondary | ICD-10-CM | POA: Diagnosis not present

## 2018-11-19 DIAGNOSIS — R7303 Prediabetes: Secondary | ICD-10-CM

## 2018-11-19 DIAGNOSIS — E785 Hyperlipidemia, unspecified: Secondary | ICD-10-CM

## 2018-11-19 DIAGNOSIS — K219 Gastro-esophageal reflux disease without esophagitis: Secondary | ICD-10-CM

## 2018-11-19 DIAGNOSIS — R42 Dizziness and giddiness: Secondary | ICD-10-CM

## 2018-11-19 DIAGNOSIS — Z0000 Encounter for general adult medical examination without abnormal findings: Secondary | ICD-10-CM | POA: Diagnosis not present

## 2018-11-19 DIAGNOSIS — R002 Palpitations: Secondary | ICD-10-CM

## 2018-11-19 MED ORDER — ONDANSETRON 4 MG PO TBDP: 4.0000 mg | ORAL_TABLET | Freq: Three times a day (TID) | ORAL | 0 refills | Status: DC | PRN

## 2018-11-19 NOTE — Assessment & Plan Note (Signed)
Immunizations UTD. Discussed the importance of a healthy diet and regular exercise in order for weight loss, and to reduce the risk of any potential medical problems. Strongly advised he quit smoking. Exam stable. Labs reviewed.

## 2018-11-19 NOTE — Assessment & Plan Note (Signed)
Chronic, continue Singulair, Zyrtec, Atrovent.

## 2018-11-19 NOTE — Assessment & Plan Note (Signed)
Recent lipid panel stable, continue simvastatin. 

## 2018-11-19 NOTE — Assessment & Plan Note (Signed)
Stable on recent lab, still above goal. Strongly advised he work on diet and exercise. Repeat in 6 months.

## 2018-11-19 NOTE — Assessment & Plan Note (Signed)
Stable in the office today, continue HCTZ and metoprolol tartrate. BMP from June 2020 unremarkable.

## 2018-11-19 NOTE — Progress Notes (Signed)
Subjective:    Patient ID: Ronald Burton, male    DOB: [birth year], 48 y.o.   MRN: [id number]  HPI  Mr. Ronald Burton is a 48 year old male who presents today for complete physical.  Immunizations: -Tetanus: Completed in 2015 -Influenza: Due this season   Diet: He endorses a healthier diet. He eats home cooked meals. Is cutting back on pork, salty, and fatty foods. He is drinking mostly water, little soda, decaff coffee.  Exercise: He is not exercising  Eye exam: Completed in June 2020 Dental exam: Due soon  BP Readings from Last 3 Encounters:  11/19/18 120/82  11/10/18 132/80  08/27/18 112/84     Review of Systems  Constitutional: Negative for unexpected weight change.  HENT: Negative for rhinorrhea.   Respiratory: Negative for cough and shortness of breath.   Cardiovascular: Negative for chest pain.       Intermittent palpitations  Gastrointestinal: Positive for nausea. Negative for constipation and diarrhea.       Persistent GERD symptoms despite PPI and H2 Blocker, will take Pepto Bismol. Esophageal burning, epigastric pain, nausea, bloating.  Genitourinary: Negative for difficulty urinating.  Musculoskeletal: Negative for joint swelling.  Skin: Negative for rash.  Allergic/Immunologic: Negative for environmental allergies.  Neurological: Negative for dizziness and numbness.       Some headaches, not taking topiramate.   Psychiatric/Behavioral:       Intermittent anxiety, does feel well on Buspar per [facility].       Past Medical History:  Diagnosis Date  . Anxiety   . Arthritis    knees  . BPH (benign prostatic hyperplasia)   . Complication of anesthesia    DIFFICULTY WAKING AFTER SINUS SURGERY  . Depression   . Deviated septum   . GERD (gastroesophageal reflux disease)   . Headache   . Hyperlipidemia   . Hypertension   . Irregular heart beat   . Migraines   . OSA (obstructive sleep apnea)    does not use CPAP  . Palpitations   . Seasonal allergies   .  Tinnitus   . Urine incontinence      Social History   Socioeconomic History  . Marital status: Married    Spouse name: Not on file  . Number of children: Not on file  . Years of education: Not on file  . Highest education level: Not on file  Occupational History  . Occupation: Production designer, theatre/television/filmmanager for [employer]  Social Needs  . Financial resource strain: Not on file  . Food insecurity    Worry: Not on file    Inability: Not on file  . Transportation needs    Medical: Not on file    Non-medical: Not on file  Tobacco Use  . Smoking status: Never Smoker  . Smokeless tobacco: Current User    Types: Chew  . Tobacco comment: he does need a patch  Substance and Sexual Activity  . Alcohol use: Not Currently    Comment: social  . Drug use: No  . Sexual activity: Not on file  Lifestyle  . Physical activity    Days per week: Not on file    Minutes per session: Not on file  . Stress: Not on file  Relationships  . Social Musicianconnections    Talks on phone: Not on file    Gets together: Not on file    Attends religious service: Not on file    Active member of club or organization: Not on file    Attends  meetings of clubs or organizations: Not on file    Relationship status: Not on file  . Intimate partner violence    Fear of current or ex partner: Not on file    Emotionally abused: Not on file    Physically abused: Not on file    Forced sexual activity: Not on file  Other Topics Concern  . Not on file  Social History Narrative   Married.   6 children, 1 grandchild.   Works as a Health and safety inspector   Enjoys spending time with family, video games.     Past Surgical History:  Procedure Laterality Date  . ETHMOIDECTOMY Bilateral 08/04/2017   Procedure: BILATERAL ETHMOIDECTOMY WITH TISSUE REMOVAL;  Surgeon: Leta Baptist, MD;  Location: [facility];  Service: ENT;  Laterality: Bilateral;  . EXCISION CHONCHA BULLOSA Left 08/04/2017   Procedure: LEFT CONCHA BULLOSA RESECTION;  Surgeon:  Leta Baptist, MD;  Location: [facility];  Service: ENT;  Laterality: Left;  . FRONTAL SINUS EXPLORATION Bilateral 08/04/2017   Procedure: BILATERAL FRONTAL RECESS SINUS EXPLORATION;  Surgeon: Leta Baptist, MD;  Location: [facility];  Service: ENT;  Laterality: Bilateral;  . LEFT HEART CATH AND CORONARY ANGIOGRAPHY N/A 10/16/2017   Procedure: LEFT HEART CATH AND CORONARY ANGIOGRAPHY;  Surgeon: Leonie Man, MD;  Location: [facility];  Service: Cardiovascular;  Laterality: N/A;  . MAXILLARY ANTROSTOMY Bilateral 08/04/2017   Procedure: BILATERAL MAXILLARY ANTROSTOMY WITH TISSUE REMOVAL;  Surgeon: Leta Baptist, MD;  Location: [facility];  Service: ENT;  Laterality: Bilateral;  . NASAL SEPTOPLASTY W/ TURBINOPLASTY N/A 08/04/2017   Procedure: NASAL SEPTOPLASTY WITH TURBINATE REDUCTION;  Surgeon: Leta Baptist, MD;  Location: [facility];  Service: ENT;  Laterality: N/A;  . SINOSCOPY    . SINUS ENDO WITH FUSION Bilateral 08/04/2017   Procedure: SINUS ENDO WITH FUSION NAVIGATION;  Surgeon: Leta Baptist, MD;  Location: [facility];  Service: ENT;  Laterality: Bilateral;  . SPHENOIDECTOMY Bilateral 08/04/2017   Procedure: BILATERAL SPHENOIDECTOMY WITH TISSUE REMOVAL;  Surgeon: Leta Baptist, MD;  Location: [facility];  Service: ENT;  Laterality: Bilateral;    Family History  Problem Relation Age of Onset  . Arthritis Mother   . Hyperlipidemia Mother   . Hypertension Mother   . Stroke Mother   . Diabetes Mother   . Stomach cancer Mother   . Hyperlipidemia Father   . Heart disease Father 73  . Hypertension Father     Allergies  Allergen Reactions  . Paxil [Paroxetine] Other (See Comments)    Induced panic    Current Outpatient Medications on File Prior to Visit  Medication Sig Dispense Refill  . aspirin EC 81 MG tablet Take 81 mg by mouth daily at 12 noon.     . cetirizine (ZYRTEC) 10 MG tablet Take 10 mg by mouth daily.     Marland Kitchen dexlansoprazole (DEXILANT) 60 MG capsule Take 1 capsule (60 mg total) by mouth daily. 90 capsule 0  . diclofenac sodium (VOLTAREN) 1 % GEL Apply 2-4 g topically daily as needed (for knee pain).     . Famotidine (PEPCID AC PO) Take by mouth as needed.    . hydrochlorothiazide (HYDRODIURIL) 12.5 MG tablet Take 1 tablet (12.5 mg total) by mouth as needed (for BP above 130). For blood pressure. 30 tablet 2  . ipratropium (ATROVENT) 0.03 % nasal spray Place 2 sprays into both nostrils 4 (four) times daily. 30 mL 5  .  ketotifen (ZADITOR) 0.025 % ophthalmic solution Place 1 drop into both eyes 2 (two) times daily. 5 mL 5  . levalbuterol (XOPENEX HFA) 45 MCG/ACT inhaler TAKE 2 PUFFS BY MOUTH EVERY 6 HOURS AS NEEDED FOR WHEEZE 15 Inhaler 2  . metoprolol succinate (TOPROL-XL) 50 MG 24 hr tablet Take 50 mg in the morning and 50 mg in the evening 180 tablet 3  . montelukast (SINGULAIR) 10 MG tablet TAKE 1 TABLET BY MOUTH EVERYDAY AT BEDTIME 30 tablet 3  . nicotine polacrilex (NICORETTE) 4 MG gum Take 1 each (4 mg total) by mouth as needed for smoking cessation. 100 tablet 0  . simvastatin (ZOCOR) 20 MG tablet TAKE 1 TABLET (20 MG TOTAL) BY MOUTH EVERY EVENING. FOR CHOLESTEROL. 30 tablet 2  . topiramate (TOPAMAX) 25 MG tablet Take 25 mg by mouth daily as needed (at onset of headaches).     Marland Kitchen. UNABLE TO FIND CPAP: At bedtime    . XHANCE 93 MCG/ACT EXHU BLOW TWO DOSES IN EACH NOSTRIL TWICE DAILY AS DIRECTED. 16 mL 2   No current facility-administered medications on file prior to visit.     BP 120/82   Pulse 82   Temp 98.1 F (36.7 C) (Temporal)   Ht 5\' 9"  (1.753 m)   Wt 205 lb (93 kg)   SpO2 98%   BMI 30.27 kg/m    Objective:   Physical Exam  Constitutional: He is oriented to person, place, and time. He appears well-nourished.  HENT:  Mouth/Throat: No oropharyngeal exudate.  Eyes: Pupils are equal, round, and reactive to light. EOM are normal.  Neck: Neck supple. No thyromegaly present.   Cardiovascular: Normal rate and regular rhythm.  Respiratory: Effort normal and breath sounds normal.  GI: Soft. Bowel sounds are normal. There is no abdominal tenderness.  Musculoskeletal: Normal range of motion.  Neurological: He is alert and oriented to person, place, and time.  Skin: Skin is warm and dry.  Psychiatric: He has a normal mood and affect.           Assessment & Plan:

## 2018-11-19 NOTE — Assessment & Plan Note (Signed)
Discussed the importance of quitting and how this can trigger GERD.

## 2018-11-19 NOTE — Assessment & Plan Note (Signed)
Intermittent, full work up per cardiology, several hospital visits. He suspect that GERD may be contributing which is possible. Will send to GI for GERD evaluation.

## 2018-11-19 NOTE — Patient Instructions (Signed)
You will be contacted regarding your referral to GI for your GERD symptoms.  Please let us know if you have not been contacted within one week.   It's important to improve your diet by reducing consumption of fast food, fried food, processed snack foods, sugary drinks. Increase consumption of fresh vegetables and fruits, whole grains, water.  Ensure you are drinking 64 ounces of water daily.  Start exercising. You should be getting 150 minutes of moderate intensity exercise weekly.  Schedule a lab only appointment for 6 months to repeat your A1C blood sugar test.  It was a pleasure to see you today!   Preventive Care 47-37 Years Old, Male Preventive care refers to lifestyle choices and visits with your health care provider that can promote health and wellness. This includes:  A yearly physical exam. This is also called an annual well check.  Regular dental and eye exams.  Immunizations.  Screening for certain conditions.  Healthy lifestyle choices, such as eating a healthy diet, getting regular exercise, not using drugs or products that contain nicotine and tobacco, and limiting alcohol use. What can I expect for my preventive care visit? Physical exam Your health care provider will check:  Height and weight. These may be used to calculate body mass index (BMI), which is a measurement that tells if you are at a healthy weight.  Heart rate and blood pressure.  Your skin for abnormal spots. Counseling Your health care provider may ask you questions about:  Alcohol, tobacco, and drug use.  Emotional well-being.  Home and relationship well-being.  Sexual activity.  Eating habits.  Work and work Statistician. What immunizations do I need?  Influenza (flu) vaccine  This is recommended every year. Tetanus, diphtheria, and pertussis (Tdap) vaccine  You may need a Td booster every 10 years. Varicella (chickenpox) vaccine  You may need this vaccine if you have not  already been vaccinated. Zoster (shingles) vaccine  You may need this after age 60. Measles, mumps, and rubella (MMR) vaccine  You may need at least one dose of MMR if you were born in 1957 or later. You may also need a second dose. Pneumococcal conjugate (PCV13) vaccine  You may need this if you have certain conditions and were not previously vaccinated. Pneumococcal polysaccharide (PPSV23) vaccine  You may need one or two doses if you smoke cigarettes or if you have certain conditions. Meningococcal conjugate (MenACWY) vaccine  You may need this if you have certain conditions. Hepatitis A vaccine  You may need this if you have certain conditions or if you travel or work in places where you may be exposed to hepatitis A. Hepatitis B vaccine  You may need this if you have certain conditions or if you travel or work in places where you may be exposed to hepatitis B. Haemophilus influenzae type b (Hib) vaccine  You may need this if you have certain risk factors. Human papillomavirus (HPV) vaccine  If recommended by your health care provider, you may need three doses over 6 months. You may receive vaccines as individual doses or as more than one vaccine together in one shot (combination vaccines). Talk with your health care provider about the risks and benefits of combination vaccines. What tests do I need? Blood tests  Lipid and cholesterol levels. These may be checked every 5 years, or more frequently if you are over 72 years old.  Hepatitis C test.  Hepatitis B test. Screening  Lung cancer screening. You may have this screening  every year starting at age 33 if you have a 30-pack-year history of smoking and currently smoke or have quit within the past 15 years.  Prostate cancer screening. Recommendations will vary depending on your family history and other risks.  Colorectal cancer screening. All adults should have this screening starting at age 17 and continuing until age  58. Your health care provider may recommend screening at age 28 if you are at increased risk. You will have tests every 1-10 years, depending on your results and the type of screening test.  Diabetes screening. This is done by checking your blood sugar (glucose) after you have not eaten for a while (fasting). You may have this done every 1-3 years.  Sexually transmitted disease (STD) testing. Follow these instructions at home: Eating and drinking  Eat a diet that includes fresh fruits and vegetables, whole grains, lean protein, and low-fat dairy products.  Take vitamin and mineral supplements as recommended by your health care provider.  Do not drink alcohol if your health care provider tells you not to drink.  If you drink alcohol: ? Limit how much you have to 0-2 drinks a day. ? Be aware of how much alcohol is in your drink. In the [place] one drink equals one 12 oz bottle of beer (355 mL), one 5 oz glass of wine (148 mL), or one 1 oz glass of hard liquor (44 mL). Lifestyle  Take daily care of your teeth and gums.  Stay active. Exercise for at least 30 minutes on 5 or more days each week.  Do not use any products that contain nicotine or tobacco, such as cigarettes, e-cigarettes, and chewing tobacco. If you need help quitting, ask your health care provider.  If you are sexually active, practice safe sex. Use a condom or other form of protection to prevent STIs (sexually transmitted infections).  Talk with your health care provider about taking a low-dose aspirin every day starting at age 9. What's next?  Go to your health care provider once a year for a well check visit.  Ask your health care provider how often you should have your eyes and teeth checked.  Stay up to date on all vaccines. This information is not intended to replace advice given to you by your health care provider. Make sure you discuss any questions you have with your health care provider. Document Released:  05/25/2015 Document Revised: 04/22/2018 Document Reviewed: 04/22/2018 Elsevier Patient Education  2020 [employer].

## 2018-11-19 NOTE — Assessment & Plan Note (Signed)
Compliant to CPAP machine nightly, continue same. 

## 2018-11-19 NOTE — Assessment & Plan Note (Addendum)
Managed on famotidine and Dexilant daily, using Pepto Bismol on occasion due to continued symptoms. Suspects GERD may be contributing to palpitations.  Symptoms include palpitations, esophageal burning, epigastric discomfort, nausea. No recent endoscopy. He is requesting a refill of Zofran to use PRN, refill provided.  Given recurrent symptoms without improvement, will send to GI for evaluation and endoscopy.

## 2018-11-30 ENCOUNTER — Ambulatory Visit (INDEPENDENT_AMBULATORY_CARE_PROVIDER_SITE_OTHER): Payer: 59 | Admitting: Gastroenterology

## 2018-11-30 ENCOUNTER — Telehealth: Payer: Self-pay

## 2018-11-30 ENCOUNTER — Encounter: Payer: Self-pay | Admitting: Gastroenterology

## 2018-11-30 ENCOUNTER — Other Ambulatory Visit: Payer: Self-pay

## 2018-11-30 VITALS — BP 141/93 | HR 84 | Temp 98.4°F | Ht 69.0 in | Wt 205.0 lb

## 2018-11-30 DIAGNOSIS — K219 Gastro-esophageal reflux disease without esophagitis: Secondary | ICD-10-CM

## 2018-11-30 MED ORDER — OMEPRAZOLE 20 MG PO CPDR: 20.0000 mg | DELAYED_RELEASE_CAPSULE | Freq: Two times a day (BID) | ORAL | 1 refills | Status: DC

## 2018-11-30 NOTE — Progress Notes (Signed)
Ronald Burton [address]  [address]  [place], [place] [address]  Main: [phone]  Fax: [phone]   Gastroenterology Consultation  Referring Provider:     Doreene Nestlark, Katherine K, NP Primary Care Physician:  Doreene Nestlark, Katherine K, NP Reason for Consultation:     GERD        HPI:    Chief Complaint  Patient presents with  . Abdominal Pain  . Gastroesophageal Reflux    Ronald Burton is a 48 y.o. y/o male referred for consultation & management  by Dr. Chestine Sporelark, Keane ScrapeKatherine K, NP.  Patient with history of chronic GERD and has been on Dexilant for 2 years, with recent worsening of symptoms over the last 3 to 6 months.  States has severe regurgitation, heartburn, multiple times a day and especially at night.  In addition to Dexilant once daily he has also started taking Pepcid twice a day and having to use Pepto-Bismol on top of this due to ongoing symptoms.  No dysphagia.  No weight loss.  No nausea or vomiting.  No weight loss.  No blood in stool.  No abdominal pain.  No altered bowel habits.  No prior upper endoscopy.  Family history of gastric cancer in mother.  Reports history of a colonoscopy 1 year ago and polyps were found.  He was told to have a repeat in 3 years.  Procedure report not available.  Past Medical History:  Diagnosis Date  . Anxiety   . Arthritis    knees  . BPH (benign prostatic hyperplasia)   . Complication of anesthesia    DIFFICULTY WAKING AFTER SINUS SURGERY  . Depression   . Deviated septum   . GERD (gastroesophageal reflux disease)   . Headache   . Hyperlipidemia   . Hypertension   . Irregular heart beat   . Migraines   . OSA (obstructive sleep apnea)    does not use CPAP  . Palpitations   . Seasonal allergies   . Tinnitus   . Urine incontinence     Past Surgical History:  Procedure Laterality Date  . ETHMOIDECTOMY Bilateral 08/04/2017   Procedure: BILATERAL ETHMOIDECTOMY WITH TISSUE REMOVAL;  Surgeon: Newman Pieseoh, Su, MD;  Location: [facility];  Service: ENT;  Laterality: Bilateral;  . EXCISION CHONCHA BULLOSA Left 08/04/2017   Procedure: LEFT CONCHA BULLOSA RESECTION;  Surgeon: Newman Pieseoh, Su, MD;  Location: [facility];  Service: ENT;  Laterality: Left;  . FRONTAL SINUS EXPLORATION Bilateral 08/04/2017   Procedure: BILATERAL FRONTAL RECESS SINUS EXPLORATION;  Surgeon: Newman Pieseoh, Su, MD;  Location: [facility];  Service: ENT;  Laterality: Bilateral;  . LEFT HEART CATH AND CORONARY ANGIOGRAPHY N/A 10/16/2017   Procedure: LEFT HEART CATH AND CORONARY ANGIOGRAPHY;  Surgeon: Marykay LexHarding, David W, MD;  [facility];  Service: Cardiovascular;  Laterality: N/A;  . MAXILLARY ANTROSTOMY Bilateral 08/04/2017   Procedure: BILATERAL MAXILLARY ANTROSTOMY WITH TISSUE REMOVAL;  Surgeon: Newman Pieseoh, Su, MD;  Location: [facility];  Service: ENT;  Laterality: Bilateral;  . NASAL SEPTOPLASTY W/ TURBINOPLASTY N/A 08/04/2017   Procedure: NASAL SEPTOPLASTY WITH TURBINATE REDUCTION;  Surgeon: Newman Pieseoh, Su, MD;  Location: [facility];  Service: ENT;  Laterality: N/A;  . SINOSCOPY    . SINUS ENDO WITH FUSION Bilateral 08/04/2017   Procedure: SINUS ENDO WITH FUSION NAVIGATION;  Surgeon: Newman Pieseoh, Su, MD;  Location:  SURGERY CENTER;  Service: ENT;  Laterality: Bilateral;  . SPHENOIDECTOMY Bilateral 08/04/2017  Procedure: BILATERAL SPHENOIDECTOMY WITH TISSUE REMOVAL;  Surgeon: Leta Baptist, MD;  Location: [facility];  Service: ENT;  Laterality: Bilateral;    Prior to Admission medications   Medication Sig Start Date End Date Taking? Authorizing Provider  aspirin EC 81 MG tablet Take 81 mg by mouth daily at 12 noon.    Yes [provider]  cetirizine (ZYRTEC) 10 MG tablet Take 10 mg by mouth daily.   Yes [provider]  diclofenac sodium (VOLTAREN) 1 % GEL Apply 2-4 g topically daily as needed (for knee pain).  06/27/16  Yes [provider]  Famotidine  (PEPCID AC PO) Take by mouth as needed.   Yes [provider]  hydrochlorothiazide (HYDRODIURIL) 12.5 MG tablet Take 1 tablet (12.5 mg total) by mouth as needed (for BP above 130). For blood pressure. 11/10/18  Yes Troy Sine, MD  ipratropium (ATROVENT) 0.03 % nasal spray Place 2 sprays into both nostrils 4 (four) times daily. 11/19/17  Yes Padgett, Rae Halsted, MD  ketotifen (ZADITOR) 0.025 % ophthalmic solution Place 1 drop into both eyes 2 (two) times daily. 08/19/18  Yes Padgett, Rae Halsted, MD  levalbuterol [facility] HFA) 45 MCG/ACT inhaler TAKE 2 PUFFS BY MOUTH EVERY 6 HOURS AS NEEDED FOR WHEEZE 05/14/18  Yes Padgett, Rae Halsted, MD  metoprolol succinate (TOPROL-XL) 50 MG 24 hr tablet Take 50 mg in the morning and 50 mg in the evening 10/13/18  Yes Troy Sine, MD  montelukast (SINGULAIR) 10 MG tablet TAKE 1 TABLET BY MOUTH EVERYDAY AT BEDTIME 05/10/18  Yes Padgett, Rae Halsted, MD  nicotine polacrilex (NICORETTE) 4 MG gum Take 1 each (4 mg total) by mouth as needed for smoking cessation. 10/19/17  Yes Pleas Koch, NP  ondansetron (ZOFRAN ODT) 4 MG disintegrating tablet Take 1 tablet (4 mg total) by mouth every 8 (eight) hours as needed for nausea or vomiting. 11/19/18  Yes Pleas Koch, NP  simvastatin (ZOCOR) 20 MG tablet TAKE 1 TABLET (20 MG TOTAL) BY MOUTH EVERY EVENING. FOR CHOLESTEROL. 11/05/18  Yes Pleas Koch, NP  UNABLE TO FIND CPAP: At bedtime   Yes [provider]  Truett Perna 93 MCG/ACT EXHU BLOW TWO DOSES IN EACH NOSTRIL TWICE DAILY AS DIRECTED. 08/03/18  Yes Padgett, Rae Halsted, MD  omeprazole (PRILOSEC) 20 MG capsule Take 1 capsule (20 mg total) by mouth 2 (two) times a day. 11/30/18 01/29/19  Virgel Manifold, MD  topiramate (TOPAMAX) 25 MG tablet Take 25 mg by mouth daily as needed (at onset of headaches).     [provider]    Family History  Problem Relation Age of Onset  . Arthritis Mother   .  Hyperlipidemia Mother   . Hypertension Mother   . Stroke Mother   . Diabetes Mother   . Stomach cancer Mother   . Hyperlipidemia Father   . Heart disease Father 36  . Hypertension Father      Social History   Tobacco Use  . Smoking status: Never Smoker  . Smokeless tobacco: Current User    Types: Chew  . Tobacco comment: he does need a patch  Substance Use Topics  . Alcohol use: Not Currently    Comment: social  . Drug use: No    Allergies as of 11/30/2018 - Review Complete 11/30/2018  Allergen Reaction Noted  . Paxil [paroxetine] Other (See Comments) 01/14/2017    Review of Systems:    All systems reviewed and negative except where  noted in HPI.   Physical Exam:  BP (!) 141/93   Pulse 84   Temp 98.4 F (36.9 C)   Ht 5\' 9"  (1.753 m)   Wt 205 lb (93 kg)   BMI 30.27 kg/m  No LMP for male patient. Psych:  Alert and cooperative. Normal mood and affect. General:   Alert,  Well-developed, well-nourished, pleasant and cooperative in NAD Head:  Normocephalic and atraumatic. Eyes:  Sclera clear, no icterus.   Conjunctiva pink. Ears:  Normal auditory acuity. Nose:  No deformity, discharge, or lesions. Mouth:  No deformity or lesions,oropharynx pink & moist. Neck:  Supple; no masses or thyromegaly. Abdomen:  Normal bowel sounds.  No bruits.  Soft, non-tender and non-distended without masses, hepatosplenomegaly or hernias noted.  No guarding or rebound tenderness.    Msk:  Symmetrical without gross deformities. Good, equal movement & strength bilaterally. Pulses:  Normal pulses noted. Extremities:  No clubbing or edema.  No cyanosis. Neurologic:  Alert and oriented x3;  grossly normal neurologically. Skin:  Intact without significant lesions or rashes. No jaundice. Lymph Nodes:  No significant cervical adenopathy. Psych:  Alert and cooperative. Normal mood and affect.   Labs: CBC    Component Value Date/Time   WBC 12.4 (H) 08/22/2018 1452   RBC 5.55 08/22/2018 1452    HGB 15.6 08/22/2018 1452   HCT 45.2 08/22/2018 1452   PLT 279 08/22/2018 1452   MCV 81.4 08/22/2018 1452   MCH 28.1 08/22/2018 1452   MCHC 34.5 08/22/2018 1452   RDW 13.4 08/22/2018 1452   LYMPHSABS 1.7 03/03/2017 1030   MONOABS 0.5 03/03/2017 1030   EOSABS 0.0 03/03/2017 1030   BASOSABS 0.0 03/03/2017 1030   CMP     Component Value Date/Time   NA 137 10/25/2018 0839   K 3.7 10/25/2018 0839   CL 101 10/25/2018 0839   CO2 26 10/25/2018 0839   GLUCOSE 105 (H) 10/25/2018 0839   BUN 11 10/25/2018 0839   CREATININE 1.09 10/25/2018 0839   CALCIUM 9.4 10/25/2018 0839   PROT 7.9 08/22/2018 1452   ALBUMIN 4.2 08/22/2018 1452   AST 21 08/22/2018 1452   ALT 25 08/22/2018 1452   ALKPHOS 81 08/22/2018 1452   BILITOT 1.0 08/22/2018 1452   GFRNONAA >60 08/22/2018 1452   GFRAA >60 08/22/2018 1452    Imaging Studies: No results found.  Assessment and Plan:   Ronald Burton is a 48 y.o. y/o male has been referred for GERD  Due to ongoing reflux symptoms daily despite PPI and H2 RA therapy, EGD indicated for further evaluation Rule out Barrett's and esophagitis  We will discontinue Dexilant and start omeprazole twice daily  Patient was asked to use Pepcid as needed while omeprazole starts to work.  But only use it once a day as needed.  Can use Tums instead of Pepcid as well  Patient educated extensively on acid reflux lifestyle modification, including buying a bed wedge, not eating 3 hrs before bedtime, diet modifications, and handout given for the same.   I have discussed alternative options, risks & benefits,  which include, but are not limited to, bleeding, infection, perforation,respiratory complication & drug reaction.  The patient agrees with this plan & written consent will be obtained.    Follow-up with PCP to ensure he is getting his regular surveillance colonoscopy given his history of previous colon polyps.  He states he has reported months next visit as well.    Dr  Ronald BouillonVarnita Lluvia Gwynne  Speech recognition  software was used to dictate the above note.

## 2018-11-30 NOTE — Telephone Encounter (Signed)
   [facility] Pre-operative Risk Assessment    Request for surgical clearance:  1. What type of surgery is being performed? Upper Endoscopy   2. When is this surgery scheduled? 12/16/2018   3. What type of clearance is required (medical clearance vs. Pharmacy clearance to hold med vs. Both)? Medical clearance  4. Are there any medications that need to be held prior to surgery and how long? None   5. Practice name and name of physician performing surgery? [facility],  Dr. Vonda Antigua   6. What is your office phone number [phone]    7.   What is your office fax number [phone]  8.   Anesthesia type (None, local, MAC, general) ? General    Ronald Burton 11/30/2018, 4:55 PM  _________________________________________________________________   (provider comments below)

## 2018-11-30 NOTE — Patient Instructions (Signed)

## 2018-12-01 NOTE — Telephone Encounter (Signed)
   Primary Cardiologist: Shelva Majestic, MD  Chart reviewed as part of pre-operative protocol coverage. Patient was contacted 12/01/2018 in reference to pre-operative risk assessment for pending surgery as outlined below.  Ronald Burton was last seen on 11/10/2018 by Dr. Claiborne Billings. At that time, he was noted to be doing well from a cardiac perspective. His BP was better controlled, his palpitations were at a minimal, he had no anginal symptoms and was fully compliant with his CPAP.    Therefore, based on ACC/AHA guidelines, the patient would be at acceptable risk for the planned procedure without further cardiovascular testing.   I will route this recommendation to the requesting party via Epic fax function and remove from pre-op pool.  Please call with questions.  Kathyrn Drown, NP 12/01/2018, 8:19 AM

## 2018-12-02 ENCOUNTER — Telehealth: Payer: Self-pay | Admitting: Gastroenterology

## 2018-12-02 NOTE — Telephone Encounter (Signed)
Pt wife is calling stating pt insurance  Will not cover rx  ( she is not sure the same) that was prescribed 2x a day she also needs information about the C-Pap clearance for pt procedure so she can also notify the [facility] please call her at  cb [phone]

## 2018-12-06 ENCOUNTER — Other Ambulatory Visit: Payer: Self-pay

## 2018-12-06 MED ORDER — OMEPRAZOLE 40 MG PO CPDR: 40.0000 mg | DELAYED_RELEASE_CAPSULE | Freq: Every day | ORAL | 1 refills | Status: DC

## 2018-12-06 NOTE — Telephone Encounter (Signed)
No I don't. Just make sure there is no other history we would need clearance for in particular.

## 2018-12-06 NOTE — Telephone Encounter (Signed)
Spoke with pt wife, Ronald Burton, and informed her that Dr. Bonna Gains states clearance for pt sleep apnea won't be needed and that we have changed pt prescription for the omeprazole to 40 mg once daily.

## 2018-12-13 ENCOUNTER — Other Ambulatory Visit: Payer: Self-pay

## 2018-12-13 ENCOUNTER — Other Ambulatory Visit
Admission: RE | Admit: 2018-12-13 | Discharge: 2018-12-13 | Disposition: A | Discharge status: Home / Self Care | Payer: 59 | Source: Ambulatory Visit | Attending: Gastroenterology | Admitting: Gastroenterology

## 2018-12-13 DIAGNOSIS — Z20828 Contact with and (suspected) exposure to other viral communicable diseases: Secondary | ICD-10-CM | POA: Diagnosis not present

## 2018-12-13 DIAGNOSIS — Z01812 Encounter for preprocedural laboratory examination: Secondary | ICD-10-CM | POA: Insufficient documentation

## 2018-12-13 LAB — SARS CORONAVIRUS 2 (TAT 6-24 HRS): SARS Coronavirus 2: NEGATIVE

## 2018-12-16 ENCOUNTER — Other Ambulatory Visit: Payer: Self-pay

## 2018-12-16 ENCOUNTER — Encounter: Payer: Self-pay | Admitting: *Deleted

## 2018-12-16 ENCOUNTER — Ambulatory Visit
Admission: RE | Admit: 2018-12-16 | Discharge: 2018-12-16 | Disposition: A | Discharge status: Home / Self Care | Payer: 59 | Attending: Gastroenterology | Admitting: Gastroenterology

## 2018-12-16 ENCOUNTER — Ambulatory Visit: Payer: 59 | Admitting: Anesthesiology

## 2018-12-16 ENCOUNTER — Encounter
Admission: RE | Disposition: A | Discharge status: Home / Self Care | Payer: Self-pay | Source: Home / Self Care | Attending: Gastroenterology

## 2018-12-16 DIAGNOSIS — Z8261 Family history of arthritis: Secondary | ICD-10-CM | POA: Insufficient documentation

## 2018-12-16 DIAGNOSIS — I10 Essential (primary) hypertension: Secondary | ICD-10-CM | POA: Insufficient documentation

## 2018-12-16 DIAGNOSIS — Z7951 Long term (current) use of inhaled steroids: Secondary | ICD-10-CM | POA: Insufficient documentation

## 2018-12-16 DIAGNOSIS — Z888 Allergy status to other drugs, medicaments and biological substances status: Secondary | ICD-10-CM | POA: Diagnosis not present

## 2018-12-16 DIAGNOSIS — Z79899 Other long term (current) drug therapy: Secondary | ICD-10-CM | POA: Diagnosis not present

## 2018-12-16 DIAGNOSIS — E785 Hyperlipidemia, unspecified: Secondary | ICD-10-CM | POA: Insufficient documentation

## 2018-12-16 DIAGNOSIS — Z8249 Family history of ischemic heart disease and other diseases of the circulatory system: Secondary | ICD-10-CM | POA: Insufficient documentation

## 2018-12-16 DIAGNOSIS — H9319 Tinnitus, unspecified ear: Secondary | ICD-10-CM | POA: Diagnosis not present

## 2018-12-16 DIAGNOSIS — M1990 Unspecified osteoarthritis, unspecified site: Secondary | ICD-10-CM | POA: Diagnosis not present

## 2018-12-16 DIAGNOSIS — G43901 Migraine, unspecified, not intractable, with status migrainosus: Secondary | ICD-10-CM | POA: Insufficient documentation

## 2018-12-16 DIAGNOSIS — F419 Anxiety disorder, unspecified: Secondary | ICD-10-CM | POA: Insufficient documentation

## 2018-12-16 DIAGNOSIS — G4733 Obstructive sleep apnea (adult) (pediatric): Secondary | ICD-10-CM | POA: Diagnosis not present

## 2018-12-16 DIAGNOSIS — M170 Bilateral primary osteoarthritis of knee: Secondary | ICD-10-CM | POA: Diagnosis not present

## 2018-12-16 DIAGNOSIS — Z7982 Long term (current) use of aspirin: Secondary | ICD-10-CM | POA: Diagnosis not present

## 2018-12-16 DIAGNOSIS — R32 Unspecified urinary incontinence: Secondary | ICD-10-CM | POA: Diagnosis not present

## 2018-12-16 DIAGNOSIS — N400 Enlarged prostate without lower urinary tract symptoms: Secondary | ICD-10-CM | POA: Insufficient documentation

## 2018-12-16 DIAGNOSIS — Z833 Family history of diabetes mellitus: Secondary | ICD-10-CM | POA: Diagnosis not present

## 2018-12-16 DIAGNOSIS — Z800 Family history of malignant neoplasm of digestive organs: Secondary | ICD-10-CM | POA: Insufficient documentation

## 2018-12-16 DIAGNOSIS — R002 Palpitations: Secondary | ICD-10-CM | POA: Diagnosis not present

## 2018-12-16 DIAGNOSIS — K2950 Unspecified chronic gastritis without bleeding: Secondary | ICD-10-CM | POA: Diagnosis not present

## 2018-12-16 DIAGNOSIS — Z823 Family history of stroke: Secondary | ICD-10-CM | POA: Diagnosis not present

## 2018-12-16 DIAGNOSIS — K228 Other specified diseases of esophagus: Secondary | ICD-10-CM

## 2018-12-16 DIAGNOSIS — F329 Major depressive disorder, single episode, unspecified: Secondary | ICD-10-CM | POA: Diagnosis not present

## 2018-12-16 DIAGNOSIS — K2289 Other specified disease of esophagus: Secondary | ICD-10-CM

## 2018-12-16 DIAGNOSIS — K219 Gastro-esophageal reflux disease without esophagitis: Secondary | ICD-10-CM | POA: Diagnosis present

## 2018-12-16 DIAGNOSIS — Z791 Long term (current) use of non-steroidal anti-inflammatories (NSAID): Secondary | ICD-10-CM | POA: Diagnosis not present

## 2018-12-16 HISTORY — PX: ESOPHAGOGASTRODUODENOSCOPY (EGD) WITH PROPOFOL: SHX5813

## 2018-12-16 LAB — KOH PREP: Special Requests: NORMAL

## 2018-12-16 SURGERY — ESOPHAGOGASTRODUODENOSCOPY (EGD) WITH PROPOFOL
Anesthesia: General

## 2018-12-16 MED ORDER — LIDOCAINE HCL (CARDIAC) PF 100 MG/5ML IV SOSY
PREFILLED_SYRINGE | INTRAVENOUS | Status: DC | PRN
  Administered 2018-12-16: 50 mg via INTRAVENOUS

## 2018-12-16 MED ORDER — PROPOFOL 10 MG/ML IV BOLUS
INTRAVENOUS | Status: DC | PRN
  Administered 2018-12-16: 20 mg via INTRAVENOUS
  Administered 2018-12-16: 50 mg via INTRAVENOUS
  Administered 2018-12-16: 30 mg via INTRAVENOUS

## 2018-12-16 MED ORDER — PROPOFOL 500 MG/50ML IV EMUL
INTRAVENOUS | Status: AC
  Filled 2018-12-16: qty 50

## 2018-12-16 MED ORDER — PROPOFOL 500 MG/50ML IV EMUL
INTRAVENOUS | Status: DC | PRN
  Administered 2018-12-16: 175 ug/kg/min via INTRAVENOUS

## 2018-12-16 MED ORDER — PROPOFOL 500 MG/50ML IV EMUL
INTRAVENOUS | Status: AC
  Filled 2018-12-16: qty 100

## 2018-12-16 MED ORDER — SODIUM CHLORIDE 0.9 % IV SOLN
INTRAVENOUS | Status: DC
  Administered 2018-12-16: 09:00:00 via INTRAVENOUS

## 2018-12-16 NOTE — Op Note (Signed)
[facility] Gastroenterology Patient Name: Ronald Burton Procedure Date: 12/16/2018 9:06 AM MRN: [id number] Account #: [id number] Date of Birth: [birth year] Admit Type: Outpatient Age: 48 Room: [name] Gender: Male Note Status: Finalized Procedure:            Upper GI endoscopy Indications:          Heartburn Providers:            Bertha Lokken B. Maximino Greenlandahiliani MD, MD Referring MD:         Doreene NestKatherine K. Clark (Referring MD) Medicines:            Monitored Anesthesia Care Complications:        No immediate complications. Procedure:            Pre-Anesthesia Assessment:                       - Prior to the procedure, a History and Physical was                        performed, and patient medications, allergies and                        sensitivities were reviewed. The patient's tolerance of                        previous anesthesia was reviewed.                       - The risks and benefits of the procedure and the                        sedation options and risks were discussed with the                        patient. All questions were answered and informed                        consent was obtained.                       - Patient identification and proposed procedure were                        verified prior to the procedure by the physician, the                        nurse, the anesthesiologist, the anesthetist and the                        technician. The procedure was verified in the procedure                        room.                       - ASA Grade Assessment: II - A patient with mild                        systemic disease.                       After obtaining  informed consent, the endoscope was                        passed under direct vision. Throughout the procedure,                        the patient's blood pressure, pulse, and oxygen                        saturations were monitored continuously. The Endoscope                        was  introduced through the mouth, and advanced to the                        second part of duodenum. The upper GI endoscopy was                        accomplished with ease. The patient tolerated the                        procedure well. Findings:      The examined esophagus was normal.      White nummular lesions were noted in the mid esophagus. Brushings for       KOH prep were obtained.      The entire examined stomach was normal. Biopsies were obtained in the       gastric body, at the incisura and in the gastric antrum with cold       forceps for histology. Biopsies were taken with a cold forceps for       Helicobacter pylori testing.      The duodenal bulb, second portion of the duodenum and examined duodenum       were normal. Impression:           - Normal esophagus.                       - White nummular lesions in esophageal mucosa.                        Brushings performed.                       - Normal stomach. Biopsied.                       - Normal duodenal bulb, second portion of the duodenum                        and examined duodenum.                       - Biopsies were obtained in the gastric body, at the                        incisura and in the gastric antrum. Recommendation:       - Await pathology results.                       - Discharge patient to home (with escort).                       -  Advance diet as tolerated.                       - Continue present medications.                       - Patient has a contact number available for                        emergencies. The signs and symptoms of potential                        delayed complications were discussed with the patient.                        Return to normal activities tomorrow. Written discharge                        instructions were provided to the patient.                       - Discharge patient to home (with escort).                       - The findings and recommendations were  discussed with                        the patient.                       - The findings and recommendations were discussed with                        the patient's family. Procedure Code(s):    --- Professional ---                       [id number], Esophagogastroduodenoscopy, flexible, transoral;                        with biopsy, single or multiple Diagnosis Code(s):    --- Professional ---                       K22.8, Other specified diseases of esophagus                       R12, Heartburn CPT copyright 2019 American Medical Association. All rights reserved. The codes documented in this report are preliminary and upon coder review may  be revised to meet current compliance requirements.  Melodie BouillonVarnita Wolf Boulay, MD Michel BickersVarnita B. Maximino Greenlandahiliani MD, MD 12/16/2018 9:37:10 AM This report has been signed electronically. Number of Addenda: 0 Note Initiated On: 12/16/2018 9:06 AM Estimated Blood Loss: Estimated blood loss: none.      [facility]

## 2018-12-16 NOTE — Anesthesia Preprocedure Evaluation (Signed)
Anesthesia Evaluation  Patient identified by MRN, date of birth, ID band Patient awake  General Assessment Comment:DEVIATED SEPTUM, TURBINATE HYPERTROPHY, LEFT CHONCHA BULLOSA, MAXILLARY SINUSITIS, FRONTAL SINUSITIS, ETHMOID AND SPHENOID SINUSITIS  Reviewed: Allergy & Precautions, NPO status , Patient's Chart, lab work & pertinent test results, reviewed documented beta blocker date and time   History of Anesthesia Complications (+) PROLONGED EMERGENCE and history of anesthetic complications  Airway Mallampati: II  TM Distance: >3 FB Neck ROM: Full    Dental  (+) Teeth Intact, Dental Advisory Given   Pulmonary sleep apnea ,    Pulmonary exam normal breath sounds clear to auscultation       Cardiovascular hypertension, Pt. on home beta blockers and Pt. on medications + angina + CAD  Normal cardiovascular exam Rhythm:Regular Rate:Normal     Neuro/Psych  Headaches, PSYCHIATRIC DISORDERS Anxiety Depression    GI/Hepatic Neg liver ROS, GERD  Medicated,  Endo/Other  negative endocrine ROS  Renal/GU negative Renal ROS Bladder dysfunction      Musculoskeletal  (+) Arthritis ,   Abdominal   Peds negative pediatric ROS (+)  Hematology negative hematology ROS (+)   Anesthesia Other Findings Past Medical History: No date: Anxiety No date: Arthritis     Comment:  knees No date: BPH (benign prostatic hyperplasia) No date: Complication of anesthesia     Comment:  DIFFICULTY WAKING AFTER SINUS SURGERY No date: Depression No date: Deviated septum No date: GERD (gastroesophageal reflux disease) No date: Headache No date: Hyperlipidemia No date: Hypertension No date: Irregular heart beat No date: Migraines No date: OSA (obstructive sleep apnea)     Comment:  does not use CPAP No date: Palpitations No date: Seasonal allergies No date: Tinnitus No date: Urine incontinence  Reproductive/Obstetrics                              Anesthesia Physical  Anesthesia Plan  ASA: II  Anesthesia Plan: General   Post-op Pain Management:    Induction: Intravenous  PONV Risk Score and Plan:   Airway Management Planned: Nasal Cannula  Additional Equipment:   Intra-op Plan:   Post-operative Plan:   Informed Consent: I have reviewed the patients History and Physical, chart, labs and discussed the procedure including the risks, benefits and alternatives for the proposed anesthesia with the patient or authorized representative who has indicated his/her understanding and acceptance.     Dental advisory given  Plan Discussed with: CRNA  Anesthesia Plan Comments:         Anesthesia Quick Evaluation

## 2018-12-16 NOTE — Transfer of Care (Signed)
Immediate Anesthesia Transfer of Care Note  Patient: Ronald Burton  Procedure(s) Performed: ESOPHAGOGASTRODUODENOSCOPY (EGD) WITH PROPOFOL (N/A )  Patient Location: PACU  Anesthesia Type:General  Level of Consciousness: drowsy  Airway & Oxygen Therapy: Patient Spontanous Breathing and Patient connected to nasal cannula oxygen  Post-op Assessment: Report given to RN and Post -op Vital signs reviewed and stable  Post vital signs: Reviewed and stable  Last Vitals:  Vitals Value Taken Time  BP 112/70 12/16/18 0940  Temp 36.4 C 12/16/18 0940  Pulse 82 12/16/18 0940  Resp 17 12/16/18 0940  SpO2 97 % 12/16/18 0940  Vitals shown include unvalidated device data.  Last Pain:  Vitals:   12/16/18 0940  TempSrc: Tympanic  PainSc: 0-No pain         Complications: No apparent anesthesia complications

## 2018-12-16 NOTE — Anesthesia Post-op Follow-up Note (Signed)
Anesthesia QCDR form completed.        

## 2018-12-16 NOTE — H&P (Signed)
Melodie BouillonVarnita , MD [address], [address], [place], [place], [id number] [address], [address], [place], [place], [address] Phone: [phone]  Fax: [phone]  Primary Care Physician:  Doreene Nestlark, Katherine K, NP   Pre-Procedure History & Physical: HPI:  Ronald Burton is a 48 y.o. male is here for an EGD.   Past Medical History:  Diagnosis Date  . Anxiety   . Arthritis    knees  . BPH (benign prostatic hyperplasia)   . Complication of anesthesia    DIFFICULTY WAKING AFTER SINUS SURGERY  . Depression   . Deviated septum   . GERD (gastroesophageal reflux disease)   . Headache   . Hyperlipidemia   . Hypertension   . Irregular heart beat   . Migraines   . OSA (obstructive sleep apnea)    does not use CPAP  . Palpitations   . Seasonal allergies   . Tinnitus   . Urine incontinence     Past Surgical History:  Procedure Laterality Date  . ETHMOIDECTOMY Bilateral 08/04/2017   Procedure: BILATERAL ETHMOIDECTOMY WITH TISSUE REMOVAL;  Surgeon: Newman Pieseoh, Su, MD;  Location: [facility];  Service: ENT;  Laterality: Bilateral;  . EXCISION CHONCHA BULLOSA Left 08/04/2017   Procedure: LEFT CONCHA BULLOSA RESECTION;  Surgeon: Newman Pieseoh, Su, MD;  Location: [facility];  Service: ENT;  Laterality: Left;  . FRONTAL SINUS EXPLORATION Bilateral 08/04/2017   Procedure: BILATERAL FRONTAL RECESS SINUS EXPLORATION;  Surgeon: Newman Pieseoh, Su, MD;  Location: [facility];  Service: ENT;  Laterality: Bilateral;  . LEFT HEART CATH AND CORONARY ANGIOGRAPHY N/A 10/16/2017   Procedure: LEFT HEART CATH AND CORONARY ANGIOGRAPHY;  Surgeon: Marykay LexHarding, David W, MD;  [facility];  Service: Cardiovascular;  Laterality: N/A;  . MAXILLARY ANTROSTOMY Bilateral 08/04/2017   Procedure: BILATERAL MAXILLARY ANTROSTOMY WITH TISSUE REMOVAL;  Surgeon: Newman Pieseoh, Su, MD;  Location: [facility];  Service: ENT;  Laterality: Bilateral;  . NASAL SEPTOPLASTY W/ TURBINOPLASTY  N/A 08/04/2017   Procedure: NASAL SEPTOPLASTY WITH TURBINATE REDUCTION;  Surgeon: Newman Pieseoh, Su, MD;  Location: [facility];  Service: ENT;  Laterality: N/A;  . SINOSCOPY    . SINUS ENDO WITH FUSION Bilateral 08/04/2017   Procedure: SINUS ENDO WITH FUSION NAVIGATION;  Surgeon: Newman Pieseoh, Su, MD;  Location: [facility];  Service: ENT;  Laterality: Bilateral;  . SPHENOIDECTOMY Bilateral 08/04/2017   Procedure: BILATERAL SPHENOIDECTOMY WITH TISSUE REMOVAL;  Surgeon: Newman Pieseoh, Su, MD;  Location: [facility];  Service: ENT;  Laterality: Bilateral;    Prior to Admission medications   Medication Sig Start Date End Date Taking? Authorizing Provider  aspirin EC 81 MG tablet Take 81 mg by mouth daily at 12 noon.    Yes [provider]  busPIRone (BUSPAR) 15 MG tablet Take 15 mg by mouth 3 (three) times daily.   Yes [provider]  cetirizine (ZYRTEC) 10 MG tablet Take 10 mg by mouth daily.   Yes [provider]  diclofenac sodium (VOLTAREN) 1 % GEL Apply 2-4 g topically daily as needed (for knee pain).  06/27/16  Yes [provider]  Famotidine (PEPCID AC PO) Take by mouth as needed.   Yes [provider]  hydrochlorothiazide (HYDRODIURIL) 12.5 MG tablet Take 1 tablet (12.5 mg total) by mouth as needed (for BP above 130). For blood pressure. 11/10/18  Yes Lennette BihariKelly, Thomas A, MD  ipratropium (ATROVENT) 0.03 % nasal spray Place 2 sprays into both nostrils 4 (  four) times daily. 11/19/17  Yes Padgett, Pilar GrammesShaylar Patricia, MD  ketotifen (ZADITOR) 0.025 % ophthalmic solution Place 1 drop into both eyes 2 (two) times daily. 08/19/18  Yes Padgett, Pilar GrammesShaylar Patricia, MD  levalbuterol [facility](XOPENEX HFA) 45 MCG/ACT inhaler TAKE 2 PUFFS BY MOUTH EVERY 6 HOURS AS NEEDED FOR WHEEZE 05/14/18  Yes Padgett, Pilar GrammesShaylar Patricia, MD  metoprolol succinate (TOPROL-XL) 50 MG 24 hr tablet Take 50 mg in the morning and 50 mg in the evening 10/13/18  Yes Lennette BihariKelly, Thomas A, MD   montelukast (SINGULAIR) 10 MG tablet TAKE 1 TABLET BY MOUTH EVERYDAY AT BEDTIME 05/10/18  Yes Padgett, Pilar GrammesShaylar Patricia, MD  omeprazole (PRILOSEC) 40 MG capsule Take 1 capsule (40 mg total) by mouth daily. 12/06/18  Yes Pasty Spillersahiliani,  B, MD  ondansetron (ZOFRAN ODT) 4 MG disintegrating tablet Take 1 tablet (4 mg total) by mouth every 8 (eight) hours as needed for nausea or vomiting. 11/19/18  Yes Doreene Nestlark, Katherine K, NP  simvastatin (ZOCOR) 20 MG tablet TAKE 1 TABLET (20 MG TOTAL) BY MOUTH EVERY EVENING. FOR CHOLESTEROL. 11/05/18  Yes Doreene Nestlark, Katherine K, NP  topiramate (TOPAMAX) 25 MG tablet Take 25 mg by mouth daily as needed (at onset of headaches).    Yes [provider]  UNABLE TO FIND CPAP: At bedtime   Yes [provider]  Timmothy SoursXHANCE 93 MCG/ACT EXHU BLOW TWO DOSES IN EACH NOSTRIL TWICE DAILY AS DIRECTED. 08/03/18  Yes Padgett, Pilar GrammesShaylar Patricia, MD  nicotine polacrilex (NICORETTE) 4 MG gum Take 1 each (4 mg total) by mouth as needed for smoking cessation. Patient not taking: Reported on 12/16/2018 10/19/17   Doreene Nestlark, Katherine K, NP    Allergies as of 12/01/2018 - Review Complete 11/30/2018  Allergen Reaction Noted  . Paxil [paroxetine] Other (See Comments) 01/14/2017    Family History  Problem Relation Age of Onset  . Arthritis Mother   . Hyperlipidemia Mother   . Hypertension Mother   . Stroke Mother   . Diabetes Mother   . Stomach cancer Mother   . Hyperlipidemia Father   . Heart disease Father [id number]  . Hypertension Father     Social History   Socioeconomic History  . Marital status: Married    Spouse name: Not on file  . Number of children: Not on file  . Years of education: Not on file  . Highest education level: Not on file  Occupational History  . Occupation: Production designer, theatre/television/filmmanager for [employer]  Social Needs  . Financial resource strain: Not on file  . Food insecurity    Worry: Not on file    Inability: Not on file  . Transportation needs    Medical: Not on file     Non-medical: Not on file  Tobacco Use  . Smoking status: Never Smoker  . Smokeless tobacco: Current User    Types: Snuff  . Tobacco comment: he does need a patch  Substance and Sexual Activity  . Alcohol use: Not Currently    Comment: social  . Drug use: No  . Sexual activity: Not on file  Lifestyle  . Physical activity    Days per week: Not on file    Minutes per session: Not on file  . Stress: Not on file  Relationships  . Social Musicianconnections    Talks on phone: Not on file    Gets together: Not on file    Attends religious service: Not on file    Active member of club or organization: Not on file  Attends meetings of clubs or organizations: Not on file    Relationship status: Not on file  . Intimate partner violence    Fear of current or ex partner: Not on file    Emotionally abused: Not on file    Physically abused: Not on file    Forced sexual activity: Not on file  Other Topics Concern  . Not on file  Social History Narrative   Married.   6 children, 1 grandchild.   Works as a Health and safety inspector   Enjoys spending time with family, video games.     Review of Systems: See HPI, otherwise negative ROS  Physical Exam: BP 126/80   Pulse 75   Temp (!) 96.7 F (35.9 C) (Tympanic)   Resp 20   Ht 5\' 9"  (1.753 m)   Wt 92.1 kg   SpO2 100%   BMI 29.98 kg/m  General:   Alert,  pleasant and cooperative in NAD Head:  Normocephalic and atraumatic. Neck:  Supple; no masses or thyromegaly. Lungs:  Clear throughout to auscultation, normal respiratory effort.    Heart:  +S1, +S2, Regular rate and rhythm, No edema. Abdomen:  Soft, nontender and nondistended. Normal bowel sounds, without guarding, and without rebound.   Neurologic:  Alert and  oriented x4;  grossly normal neurologically.  Impression/Plan: Ronald Burton is here for an EGD for Acid Reflux.  Risks, benefits, limitations, and alternatives regarding the procedure have been reviewed with the patient.  Questions  have been answered.  All parties agreeable.   Virgel Manifold, MD  12/16/2018, 9:05 AM

## 2018-12-16 NOTE — Brief Op Note (Signed)
Esophageal brushing specimen sent to lab to r/o KOH

## 2018-12-17 ENCOUNTER — Encounter: Payer: Self-pay | Admitting: Gastroenterology

## 2018-12-17 LAB — SURGICAL PATHOLOGY

## 2018-12-17 NOTE — Anesthesia Postprocedure Evaluation (Signed)
Anesthesia Post Note  Patient: Abdi Trombetta  Procedure(s) Performed: ESOPHAGOGASTRODUODENOSCOPY (EGD) WITH PROPOFOL (N/A )  Patient location during evaluation: Endoscopy Anesthesia Type: General Level of consciousness: awake and alert and oriented Pain management: pain level controlled Vital Signs Assessment: post-procedure vital signs reviewed and stable Respiratory status: spontaneous breathing Cardiovascular status: blood pressure returned to baseline Anesthetic complications: no     Last Vitals:  Vitals:   12/16/18 1000 12/16/18 1010  BP: 115/87 119/83  Pulse: 71 68  Resp: (!) 26 (!) 25  Temp:    SpO2: 100% 100%    Last Pain:  Vitals:   12/16/18 1010  TempSrc:   PainSc: 0-No pain                 Dajon Rowe

## 2018-12-20 ENCOUNTER — Other Ambulatory Visit: Payer: Self-pay | Admitting: Gastroenterology

## 2018-12-20 ENCOUNTER — Telehealth: Payer: Self-pay

## 2018-12-20 MED ORDER — FLUCONAZOLE 200 MG PO TABS: ORAL_TABLET | ORAL | 0 refills | Status: DC

## 2018-12-20 NOTE — Telephone Encounter (Signed)
Pt notified of EGD results and [facility]. Pt also was made aware to stop his Simvastatin.

## 2018-12-20 NOTE — Telephone Encounter (Signed)
-----   Message from Virgel Manifold, MD sent at 12/20/2018  3:04 PM EDT ----- Your esophageal brushings showed yeast. I have sent a medication, fluconazole, to the pharmacy. Please stop your simvastatin for the 15 days you are on this medication and then resume the simvastatin when you have completed the fluconazole.

## 2018-12-22 ENCOUNTER — Other Ambulatory Visit: Payer: Self-pay

## 2018-12-22 ENCOUNTER — Telehealth: Payer: Self-pay | Admitting: Gastroenterology

## 2018-12-22 MED ORDER — FLUCONAZOLE 200 MG PO TABS: ORAL_TABLET | ORAL | 0 refills | Status: DC

## 2018-12-22 MED ORDER — FLUCONAZOLE 200 MG PO TABS: ORAL_TABLET | ORAL | 0 refills | Status: AC

## 2018-12-22 NOTE — Telephone Encounter (Signed)
PATIENT CALLED BACK & STATES HIS PHARMACY HAS BEEN OUT OF THE MEDICATION THAT WAS PRESCRIBED FOR THE YEAST INFECTION. HE NEEDS ANOTHER SCRIPT PAPER SO HE COULD TRY TO GET AT ANOTHER PHARMACY. HE IS MISERABLE.

## 2018-12-22 NOTE — Telephone Encounter (Signed)
Informed patient that we have printed the Medication and he could come pick this medication up to take to the pharmacy

## 2018-12-22 NOTE — Telephone Encounter (Signed)
Pt left vm he had a Biopsy done last week and was receipted some medicine his pharmacy has been out of stock please call pt he is still having issues

## 2018-12-29 ENCOUNTER — Other Ambulatory Visit: Payer: Self-pay | Admitting: Allergy

## 2019-01-24 ENCOUNTER — Other Ambulatory Visit: Payer: Self-pay | Admitting: Gastroenterology

## 2019-01-24 ENCOUNTER — Other Ambulatory Visit: Payer: Self-pay

## 2019-01-24 MED ORDER — MONTELUKAST SODIUM 10 MG PO TABS: 10.0000 mg | ORAL_TABLET | Freq: Every day | ORAL | 1 refills | Status: DC

## 2019-01-24 NOTE — Telephone Encounter (Signed)
Last office visit 11/30/2018 GERD  Last refill 12/06/2018 1 refills No appointment is scheduled

## 2019-02-01 ENCOUNTER — Other Ambulatory Visit: Payer: Self-pay | Admitting: Allergy

## 2019-02-03 ENCOUNTER — Other Ambulatory Visit: Payer: Self-pay | Admitting: Cardiovascular Disease

## 2019-02-03 ENCOUNTER — Other Ambulatory Visit: Payer: Self-pay | Admitting: Primary Care

## 2019-02-03 DIAGNOSIS — I10 Essential (primary) hypertension: Secondary | ICD-10-CM

## 2019-02-03 DIAGNOSIS — E7849 Other hyperlipidemia: Secondary | ICD-10-CM

## 2019-02-20 ENCOUNTER — Other Ambulatory Visit: Payer: Self-pay | Admitting: Gastroenterology

## 2019-02-21 ENCOUNTER — Other Ambulatory Visit: Payer: Self-pay | Admitting: Allergy

## 2019-02-21 NOTE — Telephone Encounter (Signed)
Last office visit 11/30/18 GERD  Last refill 01/24/2019 0 refills  No appointment is scheduled

## 2019-02-24 ENCOUNTER — Other Ambulatory Visit: Payer: Self-pay | Admitting: Allergy

## 2019-03-10 ENCOUNTER — Ambulatory Visit (INDEPENDENT_AMBULATORY_CARE_PROVIDER_SITE_OTHER): Payer: 59 | Admitting: Gastroenterology

## 2019-03-10 ENCOUNTER — Telehealth: Payer: Self-pay | Admitting: Gastroenterology

## 2019-03-10 ENCOUNTER — Other Ambulatory Visit: Payer: Self-pay

## 2019-03-10 ENCOUNTER — Encounter: Payer: Self-pay | Admitting: Gastroenterology

## 2019-03-10 ENCOUNTER — Ambulatory Visit: Payer: 59

## 2019-03-10 DIAGNOSIS — R140 Abdominal distension (gaseous): Secondary | ICD-10-CM

## 2019-03-10 MED ORDER — OMEPRAZOLE 40 MG PO CPDR: 40.0000 mg | DELAYED_RELEASE_CAPSULE | Freq: Two times a day (BID) | ORAL | 0 refills | Status: DC

## 2019-03-10 NOTE — Telephone Encounter (Signed)
Patient was given Fluconazole on 12/20/18 for 14 days and then again on 12/22/18 for 14 days. Patient states after the first round he felt better. He states last week he went on a anniversary trip and he gave his wife the yeast now. He states now he is having abdominal pain, abdominal bloating. He states he has been using Pepcid Ac and Pepto. He states he started taking the Fluconazole again yesterday and wants to know if it is okay he takes this again and if there anything else he needs to do.

## 2019-03-10 NOTE — Telephone Encounter (Signed)
Patient states he called a while back & stated he had completed his 14 day course of antibiotics for the yeast in his stomach. Another round of antibiotics was called into the pharmacy which he has. He has to stop another medication when he takes the antibiotics. Does he need to take them? Then states his stomach was feeling so bad the other night he began taken them. Please call & advise.

## 2019-03-10 NOTE — Telephone Encounter (Signed)
Made appointment today at 3:30

## 2019-03-10 NOTE — Patient Instructions (Signed)

## 2019-03-10 NOTE — Progress Notes (Signed)
Vonda Antigua, MD [address]  [place]  [place], [place] [address]  Main: [phone]  Fax: [phone]   Primary Care Physician: Pleas Koch, NP  Virtual Visit via Telephone Note  I connected with patient on 03/10/19 at  3:30 PM EDT by telephone and verified that I am speaking with the correct person using two identifiers.   I discussed the limitations, risks, security and privacy concerns of performing an evaluation and management service by telephone and the availability of in person appointments. I also discussed with the patient that there may be a patient responsible charge related to this service. The patient expressed understanding and agreed to proceed.  Location of Patient: Home Location of Provider: Home Persons involved: Patient and provider only during the visit (nursing staff and front desk staff was involved in communicating with the patient prior to the appointment, reviewing medications and checking them in)   History of Present Illness: Chief Complaint  Patient presents with  . Abdominal Pain    Patient has had abdominal pain and bloating      HPI: Ronald Burton is a 48 y.o. male following up for abdominal pain and bloating.  States symptoms started about 1 to 2 weeks ago.  He has been taking omeprazole once daily Pepcid daily, and Pepto-Bismol as needed.  This has not helped his symptoms.  He previously was treated for yeast in his esophagus with fluconazole.  He is not having any dysphagia or odynophagia.  Somehow he had a second prescription of fluconazole which we did not prescribe and he states he picked this up but has not started taking it.  Indigestion is better.  No weight loss.  No nausea or vomiting.  Current Outpatient Medications  Medication Sig Dispense Refill  . aspirin EC 81 MG tablet Take 81 mg by mouth daily at 12 noon.     . busPIRone (BUSPAR) 15 MG tablet Take 15 mg by mouth 3 (three) times daily.    . diclofenac  sodium (VOLTAREN) 1 % GEL Apply 2-4 g topically daily as needed (for knee pain).     . Famotidine (PEPCID AC PO) Take by mouth as needed.    . fluconazole (DIFLUCAN) 200 MG tablet PLEASE SEE ATTACHED FOR DETAILED DIRECTIONS    . hydrochlorothiazide (HYDRODIURIL) 12.5 MG tablet TAKE 1 TABLET (12.5 MG TOTAL) BY MOUTH AS NEEDED (FOR BP ABOVE 130). FOR BLOOD PRESSURE. 30 tablet 2  . ipratropium (ATROVENT) 0.03 % nasal spray Place 2 sprays into both nostrils 4 (four) times daily. 30 mL 5  . ketotifen (ZADITOR) 0.025 % ophthalmic solution Place 1 drop into both eyes 2 (two) times daily. 5 mL 5  . levalbuterol (XOPENEX HFA) 45 MCG/ACT inhaler TAKE 2 PUFFS BY MOUTH EVERY 6 HOURS AS NEEDED FOR WHEEZE 15 Inhaler 2  . metoprolol succinate (TOPROL-XL) 50 MG 24 hr tablet Take 50 mg in the morning and 50 mg in the evening 180 tablet 3  . montelukast (SINGULAIR) 10 MG tablet Take 1 tablet (10 mg total) by mouth at bedtime. 30 tablet 1  . omeprazole (PRILOSEC) 40 MG capsule TAKE 1 CAPSULE BY MOUTH EVERY DAY 30 capsule 0  . ondansetron (ZOFRAN ODT) 4 MG disintegrating tablet Take 1 tablet (4 mg total) by mouth every 8 (eight) hours as needed for nausea or vomiting. 30 tablet 0  . topiramate (TOPAMAX) 25 MG tablet Take 25 mg by mouth daily as needed (at onset of headaches).     Marland Kitchen  UNABLE TO FIND CPAP: At bedtime    . XHANCE 93 MCG/ACT EXHU BLOW TWO DOSES IN EACH NOSTRIL TWICE DAILY AS DIRECTED. 16 mL 2  . cetirizine (ZYRTEC) 10 MG tablet Take 10 mg by mouth daily.    . nicotine polacrilex (NICORETTE) 4 MG gum Take 1 each (4 mg total) by mouth as needed for smoking cessation. (Patient not taking: Reported on 12/16/2018) 100 tablet 0  . simvastatin (ZOCOR) 20 MG tablet TAKE 1 TABLET (20 MG TOTAL) BY MOUTH EVERY EVENING. FOR CHOLESTEROL. (Patient not taking: Reported on 03/10/2019) 90 tablet 2   No current facility-administered medications for this visit.     Allergies as of 03/10/2019 - Review Complete 03/10/2019   Allergen Reaction Noted  . Paxil [paroxetine] Other (See Comments) 01/14/2017    Review of Systems:    All systems reviewed and negative except where noted in HPI.   Observations/Objective:  Labs: CMP     Component Value Date/Time   NA 137 10/25/2018 0839   K 3.7 10/25/2018 0839   CL 101 10/25/2018 0839   CO2 26 10/25/2018 0839   GLUCOSE 105 (H) 10/25/2018 0839   BUN 11 10/25/2018 0839   CREATININE 1.09 10/25/2018 0839   CALCIUM 9.4 10/25/2018 0839   PROT 7.9 08/22/2018 1452   ALBUMIN 4.2 08/22/2018 1452   AST 21 08/22/2018 1452   ALT 25 08/22/2018 1452   ALKPHOS 81 08/22/2018 1452   BILITOT 1.0 08/22/2018 1452   GFRNONAA >60 08/22/2018 1452   GFRAA >60 08/22/2018 1452   Lab Results  Component Value Date   WBC 12.4 (H) 08/22/2018   HGB 15.6 08/22/2018   HCT 45.2 08/22/2018   MCV 81.4 08/22/2018   PLT 279 08/22/2018    Imaging Studies: No results found.  Assessment and Plan:   Ronald Burton is a 48 y.o. y/o male for follow-up of abdominal bloating  Assessment and Plan: Previous EGD showed chronic gastritis with no H. Pylori  Due to recurrence in his symptoms, we will try Omeprazole 40 mg twice daily for 30 days.  If symptoms do not get better he was asked to call us  I have asked him not to take the fluconazole that he has picked up from the pharmacy and we did not send him a new prescription.  He can check with his PCP to see if they sent it over for something before taking it.  He is not having any odynophagia or dysphagia and does not need fluconazole  (Risks of PPI use were discussed with patient including bone loss, C. Diff diarrhea, pneumonia, infections, CKD, electrolyte abnormalities.  If clinically possible based on symptoms, goal would be to maintain patient on the lowest dose possible, or discontinue the medication with institution of acid reflux lifestyle modifications over time. Pt. Verbalizes understanding and chooses to continue the medication.)   Patient educated extensively on acid reflux lifestyle modification, including buying a bed wedge, not eating 3 hrs before bedtime, diet modifications, and handout given for the same.    Follow Up Instructions:    I discussed the assessment and treatment plan with the patient. The patient was provided an opportunity to ask questions and all were answered. The patient agreed with the plan and demonstrated an understanding of the instructions.   The patient was advised to call back or seek an in-person evaluation if the symptoms worsen or if the condition fails to improve as anticipated.  I provided 13 minutes of non-face-to-face time during this encounter.  Additional time was spent in reviewing patient's chart, placing orders etc.   Virgel Manifold, MD  Speech recognition software was used to dictate this note.

## 2019-03-10 NOTE — Progress Notes (Signed)
me

## 2019-03-20 ENCOUNTER — Other Ambulatory Visit: Payer: Self-pay | Admitting: Gastroenterology

## 2019-03-23 ENCOUNTER — Telehealth: Payer: Self-pay | Admitting: Primary Care

## 2019-03-23 DIAGNOSIS — K219 Gastro-esophageal reflux disease without esophagitis: Secondary | ICD-10-CM

## 2019-03-23 MED ORDER — ONDANSETRON 4 MG PO TBDP: 4.0000 mg | ORAL_TABLET | Freq: Three times a day (TID) | ORAL | 0 refills | Status: DC | PRN

## 2019-03-23 NOTE — Telephone Encounter (Signed)
Refill sent to pharmacy.   

## 2019-03-23 NOTE — Telephone Encounter (Signed)
Patient would like to see if he can get refill on the Ondansetron He spoke with the pharmacy and they are requesting a script be sent over due.   [facility]

## 2019-03-24 ENCOUNTER — Telehealth: Payer: Self-pay

## 2019-03-24 ENCOUNTER — Other Ambulatory Visit: Payer: Self-pay | Admitting: Gastroenterology

## 2019-03-24 DIAGNOSIS — R140 Abdominal distension (gaseous): Secondary | ICD-10-CM

## 2019-03-24 DIAGNOSIS — R110 Nausea: Secondary | ICD-10-CM

## 2019-03-24 DIAGNOSIS — R109 Unspecified abdominal pain: Secondary | ICD-10-CM

## 2019-03-24 NOTE — Telephone Encounter (Signed)
Patient is scheduled for CT scan on 04/04/2019 [facility] 8:30am. Nothing to eat or drank for 4 hours before procedure. Patient needs to pick up the contrast before the scan. Called patient and patient verbalized understanding of results.

## 2019-03-24 NOTE — Telephone Encounter (Signed)
Patient states she is still having the abdominal bloating and nausea. Patient states he was increased to twice a day. Patient states he is out of the omeprazole now. Please advised if you want to change him or refill his medication

## 2019-03-30 ENCOUNTER — Telehealth: Payer: Self-pay | Admitting: Allergy

## 2019-03-30 ENCOUNTER — Other Ambulatory Visit: Payer: Self-pay | Admitting: Allergy

## 2019-03-30 ENCOUNTER — Other Ambulatory Visit: Payer: Self-pay

## 2019-03-30 MED ORDER — XHANCE 93 MCG/ACT NA EXHU: 2.0000 | INHALANT_SUSPENSION | Freq: Two times a day (BID) | NASAL | 0 refills | Status: AC

## 2019-03-30 NOTE — Telephone Encounter (Signed)
Colletta [place] (ph [phone]) from [facility] called to get refill for xhance 9mcg. States they faxed the request 2x and no response. Please send new rx to fax [phone].

## 2019-03-30 NOTE — Telephone Encounter (Signed)
Sent in [place] to [facility] for one refill of Xhance only due to patient needing appointment. Sent in [place] due to doctor not in office today and unable to sign a physical copy of RX.

## 2019-04-01 ENCOUNTER — Other Ambulatory Visit: Payer: Self-pay | Admitting: Allergy

## 2019-04-04 ENCOUNTER — Ambulatory Visit: Admission: RE | Admit: 2019-04-04 | Payer: 59 | Source: Ambulatory Visit

## 2019-05-10 ENCOUNTER — Other Ambulatory Visit: Payer: Self-pay | Admitting: Allergy

## 2019-05-10 DIAGNOSIS — K219 Gastro-esophageal reflux disease without esophagitis: Secondary | ICD-10-CM

## 2019-05-11 ENCOUNTER — Other Ambulatory Visit: Payer: Self-pay

## 2019-05-11 ENCOUNTER — Ambulatory Visit
Admission: RE | Admit: 2019-05-11 | Discharge: 2019-05-11 | Disposition: A | Discharge status: Home / Self Care | Payer: 59 | Source: Ambulatory Visit | Attending: Gastroenterology | Admitting: Gastroenterology

## 2019-05-11 DIAGNOSIS — R140 Abdominal distension (gaseous): Secondary | ICD-10-CM | POA: Diagnosis present

## 2019-05-11 DIAGNOSIS — R109 Unspecified abdominal pain: Secondary | ICD-10-CM | POA: Insufficient documentation

## 2019-05-11 DIAGNOSIS — R110 Nausea: Secondary | ICD-10-CM | POA: Insufficient documentation

## 2019-05-11 LAB — POCT I-STAT CREATININE: Creatinine, Ser: 1.3 mg/dL — ABNORMAL HIGH (ref 0.61–1.24)

## 2019-05-11 MED ORDER — IOHEXOL 300 MG/ML  SOLN
100.0000 mL | Freq: Once | INTRAMUSCULAR | Status: AC | PRN
  Administered 2019-05-11: 100 mL via INTRAVENOUS

## 2019-05-18 ENCOUNTER — Other Ambulatory Visit: Payer: Self-pay | Admitting: Primary Care

## 2019-05-18 DIAGNOSIS — R7303 Prediabetes: Secondary | ICD-10-CM

## 2019-05-19 ENCOUNTER — Other Ambulatory Visit: Payer: Self-pay | Admitting: Cardiovascular Disease

## 2019-05-19 ENCOUNTER — Telehealth: Payer: Self-pay

## 2019-05-19 ENCOUNTER — Other Ambulatory Visit: Payer: Self-pay | Admitting: Gastroenterology

## 2019-05-19 DIAGNOSIS — I10 Essential (primary) hypertension: Secondary | ICD-10-CM

## 2019-05-19 NOTE — Telephone Encounter (Signed)
Last office visit 03/10/2019 Abdominal bloating  Last refill 03/10/2019 0 refills  No appointment is scheduled

## 2019-05-19 NOTE — Telephone Encounter (Signed)
LVM w COVID screen and back lab info 1.7.2021 TLJ

## 2019-05-20 ENCOUNTER — Encounter: Payer: 59 | Admitting: Physician Assistant

## 2019-05-21 NOTE — Progress Notes (Signed)
This encounter was created in error - please disregard.

## 2019-05-23 ENCOUNTER — Other Ambulatory Visit: Payer: Self-pay

## 2019-05-23 ENCOUNTER — Other Ambulatory Visit: Payer: 59

## 2019-05-26 ENCOUNTER — Other Ambulatory Visit (INDEPENDENT_AMBULATORY_CARE_PROVIDER_SITE_OTHER): Payer: 59

## 2019-05-26 DIAGNOSIS — R7303 Prediabetes: Secondary | ICD-10-CM | POA: Diagnosis not present

## 2019-05-26 LAB — POCT GLYCOSYLATED HEMOGLOBIN (HGB A1C): Hemoglobin A1C: 5.3 % (ref 4.0–5.6)

## 2019-06-05 IMAGING — DX DG CHEST 2V
2 series · 2 of 2 positions shown · non-contrast
Comparison: 10/15/2017.

Addendum:
CLINICAL DATA: Elevated blood pressure.

EXAM:
CHEST - 2 VIEW

[chest lat]
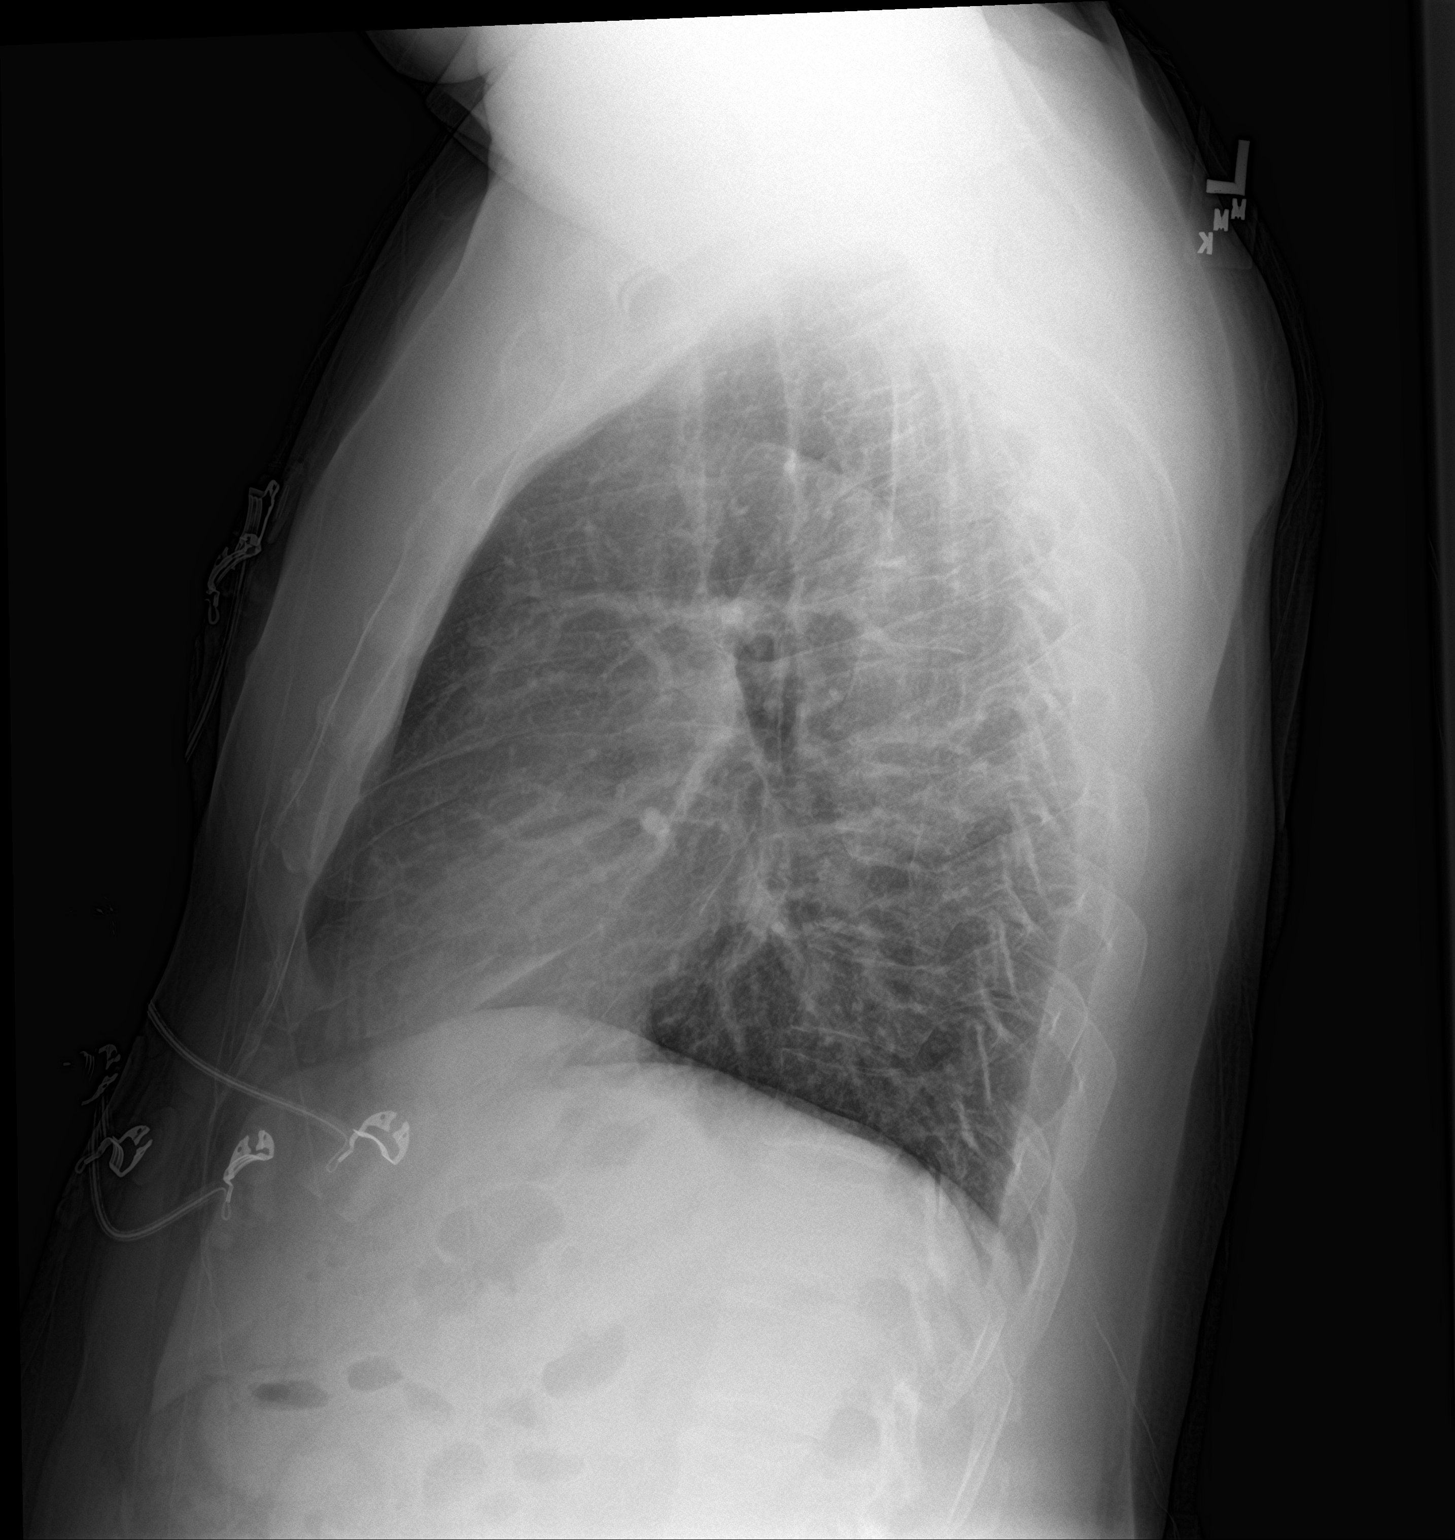

[chest pa]
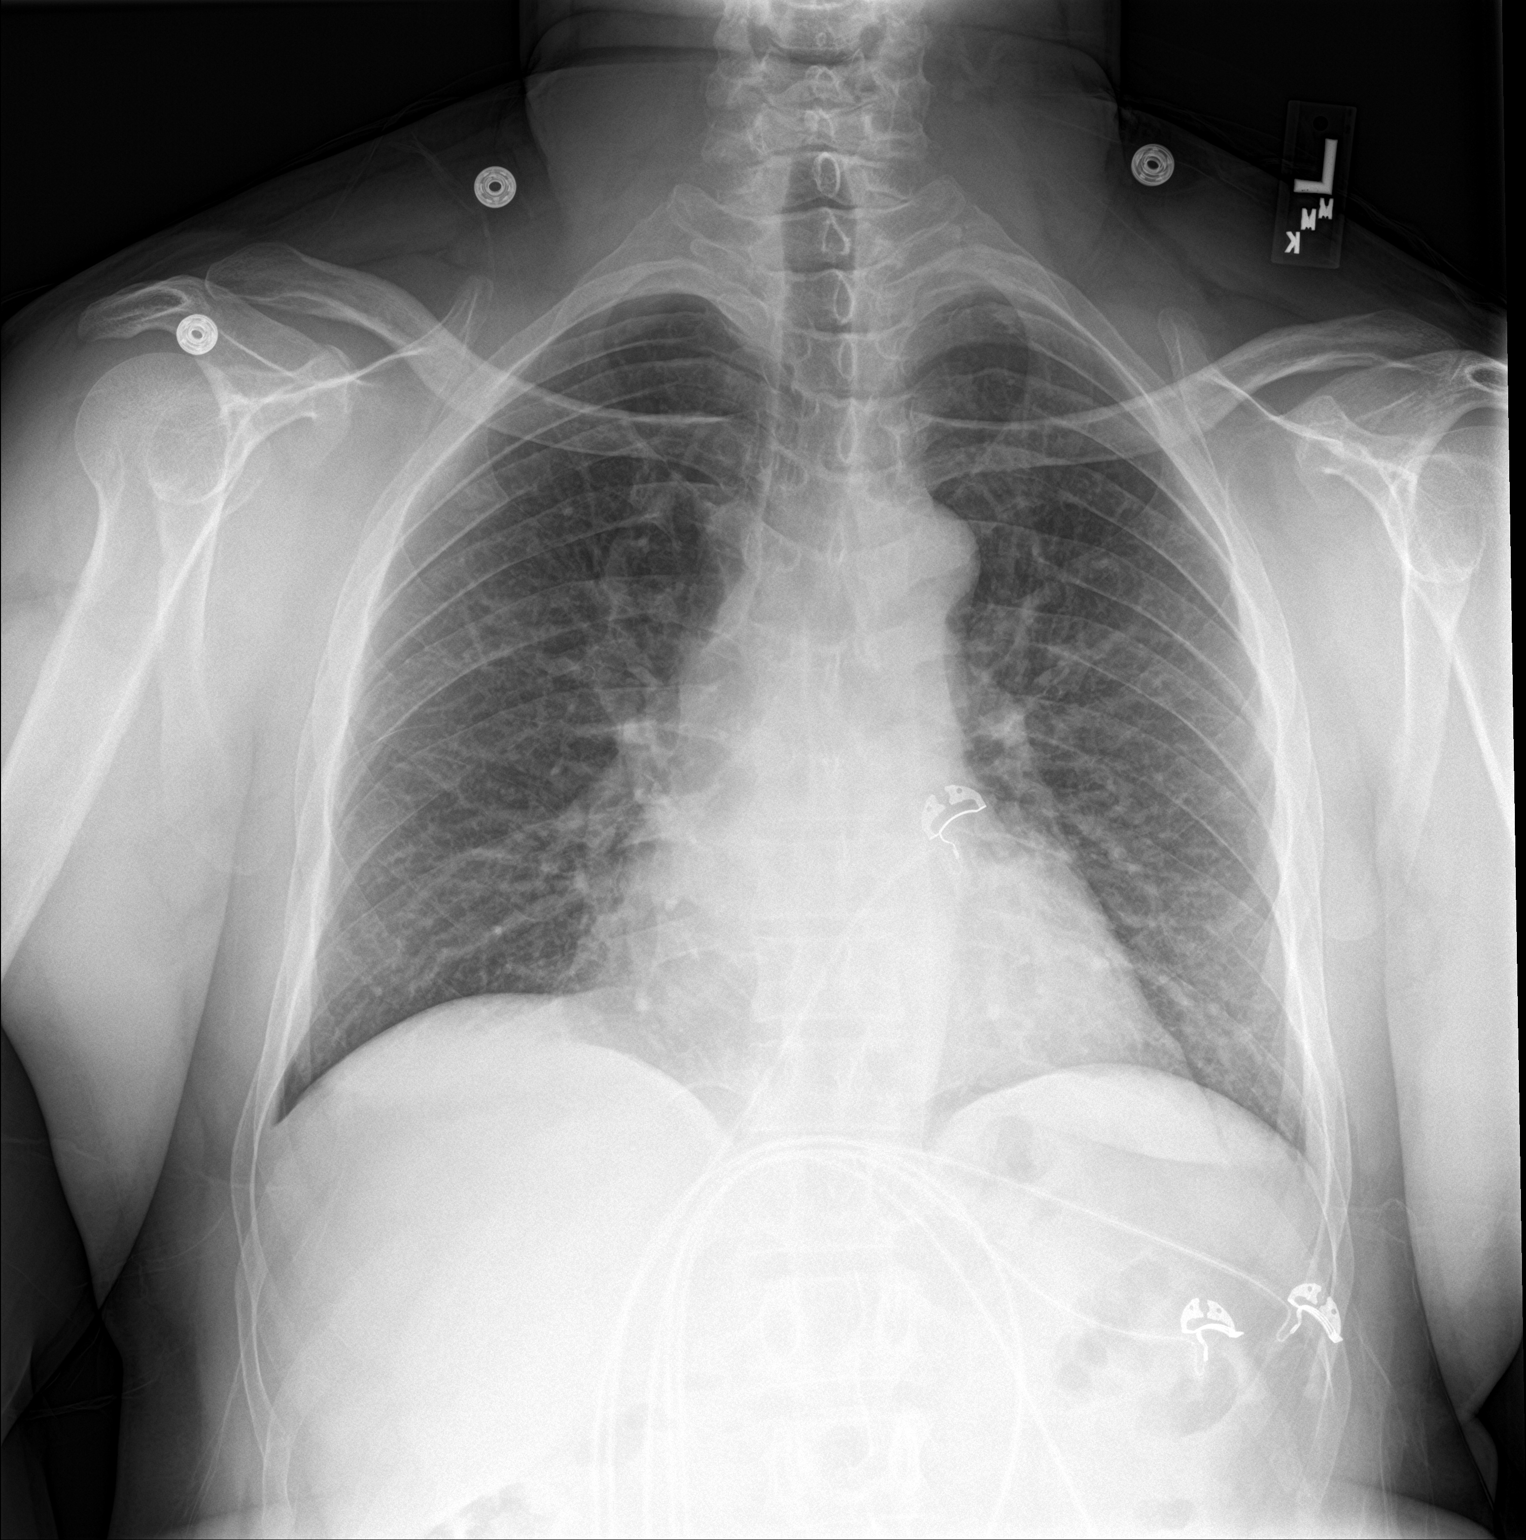

[2 of 2 positions shown; findings below may reference images not displayed]

FINDINGS: Mediastinum and hilar structures are normal. Lungs are clear of
acute infiltrates. Rounded density noted right upper lung possibly
an EKG lead pad. Repeat PA and lateral chest x-ray suggested to
demonstrate resolution no pleural effusion or pneumothorax. Heart
size normal. No acute bony abnormality.
IMPRESSION: No acute cardiopulmonary disease. Nodular density noted the right
upper lung may be secondary to an EKG lead pad. Repeat PA and
lateral chest x-ray without EKG lead pads suggested to further
evaluate in order to exclude a right upper lobe pulmonary nodule.

ADDENDUM:
It has come to my attention that the patient did have an EKG lead
over the right upper chest.

*** End of Addendum ***

## 2019-06-09 ENCOUNTER — Telehealth (INDEPENDENT_AMBULATORY_CARE_PROVIDER_SITE_OTHER): Payer: 59 | Admitting: Cardiovascular Disease

## 2019-06-09 ENCOUNTER — Encounter: Payer: Self-pay | Admitting: Cardiovascular Disease

## 2019-06-09 VITALS — BP 117/81 | HR 74 | Ht 69.0 in | Wt 187.0 lb

## 2019-06-09 DIAGNOSIS — R002 Palpitations: Secondary | ICD-10-CM | POA: Diagnosis not present

## 2019-06-09 DIAGNOSIS — G4733 Obstructive sleep apnea (adult) (pediatric): Secondary | ICD-10-CM

## 2019-06-09 DIAGNOSIS — R0789 Other chest pain: Secondary | ICD-10-CM

## 2019-06-09 DIAGNOSIS — K219 Gastro-esophageal reflux disease without esophagitis: Secondary | ICD-10-CM

## 2019-06-09 DIAGNOSIS — F419 Anxiety disorder, unspecified: Secondary | ICD-10-CM | POA: Diagnosis not present

## 2019-06-09 DIAGNOSIS — I10 Essential (primary) hypertension: Secondary | ICD-10-CM

## 2019-06-09 NOTE — Patient Instructions (Signed)
Medication Instructions:  NO CHANGES *If you need a refill on your cardiac medications before your next appointment, please call your pharmacy*  Follow-Up: At [facility], you and your health needs are our priority.  As part of our continuing mission to provide you with exceptional heart care, we have created designated Provider Care Teams.  These Care Teams include your primary Cardiologist (physician) and Advanced Practice Providers (APPs -  Physician Assistants and Nurse Practitioners) who all work together to provide you with the care you need, when you need it.  Your next appointment:   12 month(s)  The format for your next appointment:   In Person  Provider:   Thomas Kelly, MD  

## 2019-06-09 NOTE — Progress Notes (Signed)
Virtual Visit via Telephone Note   This visit type was conducted due to national recommendations for restrictions regarding the COVID-19 Pandemic (e.g. social distancing) in an effort to limit this patient's exposure and mitigate transmission in our community.  Due to his co-morbid illnesses, this patient is at least at moderate risk for complications without adequate follow up.  This format is felt to be most appropriate for this patient at this time.  The patient did not have access to video technology/had technical difficulties with video requiring transitioning to audio format only (telephone).  All issues noted in this document were discussed and addressed.  No physical exam could be performed with this format.  Please refer to the patient's chart for his  consent to telehealth for [facility].   Date:  06/09/2019   ID:  Ronald Burton, DOB 08-Oct-1970, MRN [id number]  Patient Location: Home Provider Location: Home  PCP:  Ronald Nestlark, Katherine K, NP  Cardiologist:  Ronald Guadalajarahomas Yunis Voorheis, MD  Electrophysiologist:  None   Evaluation Performed:  Follow-Up Visit  Chief Complaint:  6 month  History of Present Illness:    Ronald Burton is a 49 y.o. male who has a history of hypertension for at least 6 years.  He also has a history of migraine headaches, panic attacks, and has experienced episodes of chest pain which he describes as chest burning, at times a chest tightness and more often are a sharp knifelike sensation.  He has experienced palpitations off and on for several months and oftentimes wakes up in the middle of night with palpitations.  These palpitations may last 10-15 minutes.  He admits to having significant sinus issues.  He is sleep is poor.  He apparently was told of having sleep apnea in 2016 while living in [place].  He has a CPAP machine but has not been using it.  He admits to snoring and at times wakes up gasping for breath.  While living in [place] he had remotely undergone a  stress test which was normal.  He  presented to the emergency room with a feeling that his heart was beating out of his chest and also with some sharp chest pain.  He was tachycardic with a pulse up to 100.  ECG showed early repolarization.  I saw for initial cardiology evaluation 03/05/2017.  I reviewed his prior laboratory and recommended magnesium, TSH and free T4 checked which were normal.  An echo Doppler study on 03/12/2017 showed an ejection fraction of 60-65% with mild LVH.  He had normal diastolic function.  He had normal valvular architecture.  He felt significant improved since initiating metoprolol and is now taking this 25 mg twice a day.  He is unaware of any recurrent palpitations.  When I last saw him, I recommended that he reinstitute his CPAP therapy and discussed potential adverse consequences of untreated sleep apnea particularly with reference to his hypertension as well as nocturnal palpitations and other related issues.  I discussed the importance of avoiding pseudoephedrine preparations.  He reinstituted CPAP therapy and his sleeping significantly improved.  He has had several evaluations over the past year and was hospitalized overnight in June, and was reevaluated again in the emergency room in November 2019 and January 2020.  He saw Ronald Burton nurse practitioner in follow-up of his most recent ER evaluation.  He still notes at times his chest pounding at night.  I last saw him in the office setting in February 2020. Since I saw him, he  had  noticed his heart rate was increasing fairly consistently. His diet was somewhat poor. He was more anxious. He ultimately presented to the emergency room on August 22, 2018 (Easter Sunday) with complaints of increased heart rate, anxiety, waking up with increased heartbeat and palpitations. He also had some atypical sharp pain. A d-dimer was minimally positive. A chest CT was negative for PE and lower extremity duplex evaluation was  negative for DVT. Of note, chemistry revealed a potassium of 3.1. He was not given any supplementation. He states he has been using CPAP every night with 100% compliance. He typically has been sleeping well with CPAP and this has helped with some nocturnal palpitations. I evaluated him in a video telemedicine visit on August 27, 2018. His blood pressure had improved however when he had presented to the emergency room his blood pressure was elevated at 154/96 initially and on repeat was 147/92 with heart rates at 114 and at 97.  During that evaluation, I recommended further titration of metoprolol succinate to 50 mg twice a day for both improved control of palpitations as well as blood pressure.    I last saw him in the office on November 10, 2018.  At that time he felt better and notedresolution of prior palpitations.  Most of the time his blood pressure has been stable but at times it still may get into the 130s with diastolics in the mid to upper 80s.  He has been trying to reduce his sodium intake.  He continues to have occasional migraine headaches which is followed at the [place].  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  He continues to use CPAP with 100% compliance and was averaging 7-1/2 to 8 hours of sleep per night.  There has been resolution of his nocturnal palpitations.    Since I last saw him, he believes he is doing fairly well.  He had some increased stress over the past month and that his father-in-law and then later his mother died.  His mother died at age 32 with Covid.  He also was having some marital difficulty.  However, recently he feels improved.  He made a major change in his diet and has lost 12 pounds.  His palpitations have resolved and his GERD as well as his migraine headaches are better.  He has not had any soda over the past month.  He admits to CPAP use 100%.  He has been using over-the-counter melatonin to help with sleep initiation.  He presents for a  telemedicine evaluation.  The patient does not have symptoms concerning for COVID-19 infection (fever, chills, cough, or new shortness of breath).    Past Medical History:  Diagnosis Date  . Anxiety   . Arthritis    knees  . BPH (benign prostatic hyperplasia)   . Complication of anesthesia    DIFFICULTY WAKING AFTER SINUS SURGERY  . Depression   . Deviated septum   . GERD (gastroesophageal reflux disease)   . Headache   . Hyperlipidemia   . Hypertension   . Irregular heart beat   . Migraines   . OSA (obstructive sleep apnea)    does not use CPAP  . Palpitations   . Seasonal allergies   . Tinnitus   . Urine incontinence    Past Surgical History:  Procedure Laterality Date  . ESOPHAGOGASTRODUODENOSCOPY (EGD) WITH PROPOFOL N/A 12/16/2018   Procedure: ESOPHAGOGASTRODUODENOSCOPY (EGD) WITH PROPOFOL;  Surgeon: Pasty Spillers, MD;  Location: [facility];  Service:  Endoscopy;  Laterality: N/A;  . ETHMOIDECTOMY Bilateral 08/04/2017   Procedure: BILATERAL ETHMOIDECTOMY WITH TISSUE REMOVAL;  Surgeon: Newman Pies, MD;  Location: [facility];  Service: ENT;  Laterality: Bilateral;  . EXCISION CHONCHA BULLOSA Left 08/04/2017   Procedure: LEFT CONCHA BULLOSA RESECTION;  Surgeon: Newman Pies, MD;  Location: [facility];  Service: ENT;  Laterality: Left;  . FRONTAL SINUS EXPLORATION Bilateral 08/04/2017   Procedure: BILATERAL FRONTAL RECESS SINUS EXPLORATION;  Surgeon: Newman Pies, MD;  Location: [facility];  Service: ENT;  Laterality: Bilateral;  . LEFT HEART CATH AND CORONARY ANGIOGRAPHY N/A 10/16/2017   Procedure: LEFT HEART CATH AND CORONARY ANGIOGRAPHY;  Surgeon: Marykay Lex, MD;  Location: [facility] INVASIVE CV LAB;  Service: Cardiovascular;  Laterality: N/A;  . MAXILLARY ANTROSTOMY Bilateral 08/04/2017   Procedure: BILATERAL MAXILLARY ANTROSTOMY WITH TISSUE REMOVAL;  Surgeon: Newman Pies, MD;  Location: [facility];  Service: ENT;   Laterality: Bilateral;  . NASAL SEPTOPLASTY W/ TURBINOPLASTY N/A 08/04/2017   Procedure: NASAL SEPTOPLASTY WITH TURBINATE REDUCTION;  Surgeon: Newman Pies, MD;  Location: [facility];  Service: ENT;  Laterality: N/A;  . SINOSCOPY    . SINUS ENDO WITH FUSION Bilateral 08/04/2017   Procedure: SINUS ENDO WITH FUSION NAVIGATION;  Surgeon: Newman Pies, MD;  Location: [facility];  Service: ENT;  Laterality: Bilateral;  . SPHENOIDECTOMY Bilateral 08/04/2017   Procedure: BILATERAL SPHENOIDECTOMY WITH TISSUE REMOVAL;  Surgeon: Newman Pies, MD;  Location:  SURGERY CENTER;  Service: ENT;  Laterality: Bilateral;     Current Meds  Medication Sig  . aspirin EC 81 MG tablet Take 81 mg by mouth daily at 12 noon.   . busPIRone (BUSPAR) 15 MG tablet Take 15 mg by mouth 3 (three) times daily.  . cetirizine (ZYRTEC) 10 MG tablet Take 10 mg by mouth daily.  . diclofenac sodium (VOLTAREN) 1 % GEL Apply 2-4 g topically daily as needed (for knee pain).   . Fluticasone Propionate (XHANCE) 93 MCG/ACT EXHU Place 2 Doses into both nostrils 2 (two) times daily.  . hydrochlorothiazide (HYDRODIURIL) 12.5 MG tablet TAKE 1 TABLET (12.5 MG TOTAL) BY MOUTH EVERY OTHER DAY. FOR BLOOD PRESSURE.  Marland Kitchen ipratropium (ATROVENT) 0.03 % nasal spray Place 2 sprays into both nostrils 4 (four) times daily.  Marland Kitchen ketotifen (ZADITOR) 0.025 % ophthalmic solution Place 1 drop into both eyes 2 (two) times daily.  Marland Kitchen levalbuterol (XOPENEX HFA) 45 MCG/ACT inhaler TAKE 2 PUFFS BY MOUTH EVERY 6 HOURS AS NEEDED FOR WHEEZE  . metoprolol succinate (TOPROL-XL) 50 MG 24 hr tablet Take 50 mg in the morning and 50 mg in the evening  . omeprazole (PRILOSEC) 40 MG capsule Take 1 capsule (40 mg total) by mouth daily.  . ondansetron (ZOFRAN ODT) 4 MG disintegrating tablet Take 1 tablet (4 mg total) by mouth every 8 (eight) hours as needed for nausea or vomiting.  . simvastatin (ZOCOR) 20 MG tablet TAKE 1 TABLET (20 MG TOTAL) BY MOUTH  EVERY EVENING. FOR CHOLESTEROL.  Marland Kitchen topiramate (TOPAMAX) 25 MG tablet Take 25 mg by mouth daily as needed (at onset of headaches).   Marland Kitchen UNABLE TO FIND CPAP: At bedtime     Allergies:   Paxil [paroxetine]   Social History   Tobacco Use  . Smoking status: Never Smoker  . Smokeless tobacco: Current User    Types: Snuff  . Tobacco comment: he does need a patch  Substance Use Topics  . Alcohol  use: Not Currently    Comment: social  . Drug use: No     Family Hx: The patient's family history includes Arthritis in his mother; Diabetes in his mother; Heart disease (age of onset: 20) in his father; Hyperlipidemia in his father and mother; Hypertension in his father and mother; Stomach cancer in his mother; Stroke in his mother.  ROS:   Please see the history of present illness.    No fevers chills night sweats No cough Allergies controlled with Zyrtec No recent wheezing No anginal symptoms Palpitations improved No abdominal pain GERD controlled Anxiety/stress improved OSA on CPAP All other systems reviewed and are negative.   Prior CV studies:   The following studies were reviewed today:  I again reviewed his original sleep study from 2016 from [place] [facility].  Echo November 2018: EF 6065%, mild LVH, normal diastolic parameters  Chest CTA April 2020: Negative  Labs/Other Tests and Data Reviewed:    EKG: I personally viewed the ECG from November 10, 2018 which shows normal sinus rhythm at 79 bpm.  Normal intervals, no ectopy and no ST segment changes.  Recent Labs: 08/22/2018: ALT 25; Hemoglobin 15.6; Platelets 279 10/25/2018: BUN 11; Potassium 3.7; Sodium 137 05/11/2019: Creatinine, Ser 1.30   Recent Lipid Panel Lab Results  Component Value Date/Time   CHOL 167 10/25/2018 08:39 AM   TRIG 125.0 10/25/2018 08:39 AM   HDL 43.00 10/25/2018 08:39 AM   CHOLHDL 4 10/25/2018 08:39 AM   LDLCALC 99 10/25/2018 08:39 AM    Wt Readings from Last 3 Encounters:  06/09/19 187  lb (84.8 kg)  12/16/18 203 lb (92.1 kg)  11/30/18 205 lb (93 kg)     Objective:    Vital Signs:  BP 117/81 Comment: from 2 days ago  Pulse 74   Ht 5\' 9"  (1.753 m)   Wt 187 lb (84.8 kg)   BMI 27.62 kg/m     Since this was a telemedicine visit I could not physically examine the patient. Breathing was normal and not labored There was no audible wheezing He denied any chest wall discomfort to palpation Heart rhythm was regular by palpation There was no abdominal pain There was no swelling He had purposeful weight loss No neurologic symptoms Stress levels significantly improved  ASSESSMENT & PLAN:    1. Essential hypertension: Blood pressure today normal on current therapy consisting of metoprolol succinate in a 50 mg twice daily regimen, as well as HCTZ 12.5 mg. 2. Palpitations: Continues to be well controlled with metoprolol succinate 100 mg daily which he takes in the 50 twice daily regimen 3. Anxiety/depression: Recently exacerbated with the death of father-in-law and mother in the same week.  His mother died at age 26 with Covid.  At the time he also was having significant marital difficulties.  He is on p.o. BuSpar 15 3 times a day 4. Atypical chest pain: Resolved 5. GERD: Controlled on Pepcid 6. OSA on CPAP, followed at the [place] 7. Mild obesity: Resolved with weight loss from 203 to 187 since July.  COVID-19 Education: The signs and symptoms of COVID-19 were discussed with the patient and how to seek care for testing (follow up with PCP or arrange E-visit).  The importance of social distancing was discussed today.  Time:   Today, I have spent 23 minutes with the patient with telehealth technology discussing the above problems.     Medication Adjustments/Labs and Tests Ordered: Current medicines are reviewed at length with the patient today.  Concerns regarding medicines are outlined above.   Tests Ordered: No orders of the defined types were placed in this  encounter.   Medication Changes: No orders of the defined types were placed in this encounter.   Follow Up: In office, 1 year follow-up  Signed, Ronald Guadalajara, MD  06/09/2019 9:57 AM    [facility]

## 2019-06-15 ENCOUNTER — Other Ambulatory Visit: Payer: Self-pay | Admitting: Gastroenterology

## 2019-06-15 NOTE — Telephone Encounter (Signed)
Last office visit 03/10/2019 abdominal bloating  Last refill 05/23/2019 0 refills  No appointment is scheduled

## 2019-07-15 ENCOUNTER — Other Ambulatory Visit: Payer: Self-pay | Admitting: Gastroenterology

## 2019-07-20 ENCOUNTER — Encounter: Payer: Self-pay | Admitting: Physician Assistant

## 2019-07-29 ENCOUNTER — Encounter: Payer: Self-pay | Admitting: Primary Care

## 2019-07-29 ENCOUNTER — Other Ambulatory Visit: Payer: Self-pay

## 2019-07-29 ENCOUNTER — Ambulatory Visit (INDEPENDENT_AMBULATORY_CARE_PROVIDER_SITE_OTHER): Payer: 59 | Admitting: Primary Care

## 2019-07-29 DIAGNOSIS — I10 Essential (primary) hypertension: Secondary | ICD-10-CM

## 2019-07-29 DIAGNOSIS — Z23 Encounter for immunization: Secondary | ICD-10-CM

## 2019-07-29 DIAGNOSIS — K219 Gastro-esophageal reflux disease without esophagitis: Secondary | ICD-10-CM | POA: Diagnosis not present

## 2019-07-29 MED ORDER — ONDANSETRON 4 MG PO TBDP: 4.0000 mg | ORAL_TABLET | Freq: Three times a day (TID) | ORAL | 0 refills | Status: DC | PRN

## 2019-07-29 NOTE — Assessment & Plan Note (Signed)
Well controlled on today's visit. Continue current regimen.

## 2019-07-29 NOTE — Patient Instructions (Addendum)
Take a dose of Tylenol 500 mg the morning of your Covid-19 vaccine. You can take 500 mg every 8 hours if needed thereafter.  Use the ondansetron as needed for nausea.   We will see you this Summer for your physical.  It was a pleasure to see you today!

## 2019-07-29 NOTE — Progress Notes (Signed)
Subjective:    Patient ID: Ronald Burton, male    DOB: 12-02-1970, 49 y.o.   MRN: [id number]  HPI  This visit occurred during the SARS-CoV-2 public health emergency.  Safety protocols were in place, including screening questions prior to the visit, additional usage of staff PPE, and extensive cleaning of exam room while observing appropriate contact time as indicated for disinfecting solutions.   Mr. Canady is a 49 year old male with a history of hypertension, unstable angina, GERD, OSA, tobacco abuse, hyperlipidemia, chest pain, prediabetes who presents today to discuss the Covid-19 vaccine.   He is contemplating getting the Covid-19 vaccine which is currently being offered. His wife developed nausea and fevers with her first vaccine. He would like to ensure that he is a good candidate for the vaccine.  He denies anaphylaxis to any prior vaccine or medications. He has not have Covid-19 within the last 90 days. He has not been sick within the last two weeks.   Wt Readings from Last 3 Encounters:  07/29/19 198 lb (89.8 kg)  06/09/19 187 lb (84.8 kg)  12/16/18 203 lb (92.1 kg)     BP Readings from Last 3 Encounters:  07/29/19 110/70  06/09/19 117/81  12/16/18 119/83     Review of Systems  Constitutional: Negative for fever.  Respiratory: Negative for shortness of breath.   Cardiovascular: Negative for chest pain.  Gastrointestinal:       GERD has improved  Neurological: Negative for dizziness and headaches.       Past Medical History:  Diagnosis Date  . Anxiety   . Arthritis    knees  . BPH (benign prostatic hyperplasia)   . Complication of anesthesia    DIFFICULTY WAKING AFTER SINUS SURGERY  . Depression   . Deviated septum   . GERD (gastroesophageal reflux disease)   . Headache   . Hyperlipidemia   . Hypertension   . Irregular heart beat   . Migraines   . OSA (obstructive sleep apnea)    does not use CPAP  . Palpitations   . Seasonal allergies   .  Tinnitus   . Urine incontinence      Social History   Socioeconomic History  . Marital status: Married    Spouse name: Not on file  . Number of children: Not on file  . Years of education: Not on file  . Highest education level: Not on file  Occupational History  . Occupation: Freight forwarder for [employer]  Tobacco Use  . Smoking status: Never Smoker  . Smokeless tobacco: Current User    Types: Snuff  . Tobacco comment: he does need a patch  Substance and Sexual Activity  . Alcohol use: Not Currently    Comment: social  . Drug use: No  . Sexual activity: Not on file  Other Topics Concern  . Not on file  Social History Narrative   Married.   6 children, 1 grandchild.   Works as a Health and safety inspector   Enjoys spending time with family, video games.    Social Determinants of Health   Financial Resource Strain:   . Difficulty of Paying Living Expenses:   Food Insecurity:   . Worried About Charity fundraiser in the Last Year:   . Arboriculturist in the Last Year:   Transportation Needs:   . Film/video editor (Medical):   Marland Kitchen Lack of Transportation (Non-Medical):   Physical Activity:   . Days of Exercise per Week:   .  Minutes of Exercise per Session:   Stress:   . Feeling of Stress :   Social Connections:   . Frequency of Communication with Friends and Family:   . Frequency of Social Gatherings with Friends and Family:   . Attends Religious Services:   . Active Member of Clubs or Organizations:   . Attends Banker Meetings:   Marland Kitchen Marital Status:   Intimate Partner Violence:   . Fear of Current or Ex-Partner:   . Emotionally Abused:   Marland Kitchen Physically Abused:   . Sexually Abused:     Past Surgical History:  Procedure Laterality Date  . ESOPHAGOGASTRODUODENOSCOPY (EGD) WITH PROPOFOL N/A 12/16/2018   Procedure: ESOPHAGOGASTRODUODENOSCOPY (EGD) WITH PROPOFOL;  Surgeon: Pasty Spillers, MD;  Location: [facility];  Service: Endoscopy;  Laterality: N/A;  .  ETHMOIDECTOMY Bilateral 08/04/2017   Procedure: BILATERAL ETHMOIDECTOMY WITH TISSUE REMOVAL;  Surgeon: Newman Pies, MD;  Location: [facility];  Service: ENT;  Laterality: Bilateral;  . EXCISION CHONCHA BULLOSA Left 08/04/2017   Procedure: LEFT CONCHA BULLOSA RESECTION;  Surgeon: Newman Pies, MD;  Location: [facility];  Service: ENT;  Laterality: Left;  . FRONTAL SINUS EXPLORATION Bilateral 08/04/2017   Procedure: BILATERAL FRONTAL RECESS SINUS EXPLORATION;  Surgeon: Newman Pies, MD;  Location: [facility];  Service: ENT;  Laterality: Bilateral;  . LEFT HEART CATH AND CORONARY ANGIOGRAPHY N/A 10/16/2017   Procedure: LEFT HEART CATH AND CORONARY ANGIOGRAPHY;  Surgeon: Marykay Lex, MD;  [facility]: [facility];  Service: Cardiovascular;  Laterality: N/A;  . MAXILLARY ANTROSTOMY Bilateral 08/04/2017   Procedure: BILATERAL MAXILLARY ANTROSTOMY WITH TISSUE REMOVAL;  Surgeon: Newman Pies, MD;  Location: [facility];  Service: ENT;  Laterality: Bilateral;  . NASAL SEPTOPLASTY W/ TURBINOPLASTY N/A 08/04/2017   Procedure: NASAL SEPTOPLASTY WITH TURBINATE REDUCTION;  Surgeon: Newman Pies, MD;  Location: [facility];  Service: ENT;  Laterality: N/A;  . SINOSCOPY    . SINUS ENDO WITH FUSION Bilateral 08/04/2017   Procedure: SINUS ENDO WITH FUSION NAVIGATION;  Surgeon: Newman Pies, MD;  Location: [facility];  Service: ENT;  Laterality: Bilateral;  . SPHENOIDECTOMY Bilateral 08/04/2017   Procedure: BILATERAL SPHENOIDECTOMY WITH TISSUE REMOVAL;  Surgeon: Newman Pies, MD;  Location: [facility];  Service: ENT;  Laterality: Bilateral;    Family History  Problem Relation Age of Onset  . Arthritis Mother   . Hyperlipidemia Mother   . Hypertension Mother   . Stroke Mother   . Diabetes Mother   . Stomach cancer Mother   . Hyperlipidemia Father   . Heart disease Father 62  . Hypertension Father     Allergies  Allergen  Reactions  . Paxil [Paroxetine] Other (See Comments)    Induced panic    Current Outpatient Medications on File Prior to Visit  Medication Sig Dispense Refill  . aspirin EC 81 MG tablet Take 81 mg by mouth daily at 12 noon.     . busPIRone (BUSPAR) 15 MG tablet Take 15 mg by mouth 3 (three) times daily.    . cetirizine (ZYRTEC) 10 MG tablet Take 10 mg by mouth daily.    . diclofenac sodium (VOLTAREN) 1 % GEL Apply 2-4 g topically daily as needed (for knee pain).     . Famotidine (PEPCID AC PO) Take by mouth as needed.    . Fluticasone Propionate (XHANCE) 93 MCG/ACT EXHU Place 2 Doses into both nostrils 2 (two) times daily.  32 mL 0  . hydrochlorothiazide (HYDRODIURIL) 12.5 MG tablet TAKE 1 TABLET (12.5 MG TOTAL) BY MOUTH EVERY OTHER DAY. FOR BLOOD PRESSURE. 15 tablet 5  . ipratropium (ATROVENT) 0.03 % nasal spray Place 2 sprays into both nostrils 4 (four) times daily. 30 mL 5  . ketotifen (ZADITOR) 0.025 % ophthalmic solution Place 1 drop into both eyes 2 (two) times daily. 5 mL 5  . levalbuterol (XOPENEX HFA) 45 MCG/ACT inhaler TAKE 2 PUFFS BY MOUTH EVERY 6 HOURS AS NEEDED FOR WHEEZE 15 Inhaler 2  . metoprolol succinate (TOPROL-XL) 50 MG 24 hr tablet Take 50 mg in the morning and 50 mg in the evening 180 tablet 3  . omeprazole (PRILOSEC) 40 MG capsule TAKE 1 CAPSULE BY MOUTH EVERY DAY 30 capsule 1  . simvastatin (ZOCOR) 20 MG tablet TAKE 1 TABLET (20 MG TOTAL) BY MOUTH EVERY EVENING. FOR CHOLESTEROL. 90 tablet 2  . topiramate (TOPAMAX) 25 MG tablet Take 25 mg by mouth daily as needed (at onset of headaches).     Marland Kitchen UNABLE TO FIND CPAP: At bedtime     No current facility-administered medications on file prior to visit.    BP 110/70   Pulse 82   Temp (!) 97.2 F (36.2 C) (Temporal)   Ht 5\' 9"  (1.753 m)   Wt 198 lb (89.8 kg)   SpO2 98%   BMI 29.24 kg/m    Objective:   Physical Exam  Constitutional: He appears well-nourished.  Cardiovascular: Normal rate and regular rhythm.   Respiratory: Effort normal and breath sounds normal.  Musculoskeletal:     Cervical back: Neck supple.  Skin: Skin is warm and dry.  Psychiatric: He has a normal mood and affect.           Assessment & Plan:

## 2019-07-29 NOTE — Assessment & Plan Note (Signed)
Improved with weight loss.  Refill provided for ondansetron that he uses sparingly if needed.

## 2019-07-29 NOTE — Assessment & Plan Note (Signed)
Encouraged Covid-19 vaccination for patient. He has no known anaphylaxis to other vaccines, has not had Covid-19 in last 90 days, no acute illness.  Refill provided for ondansetron to use if needed for potential side effects from vaccine. Also discussed use of Tylenol.

## 2019-09-28 ENCOUNTER — Telehealth: Payer: Self-pay | Admitting: Gastroenterology

## 2019-09-28 NOTE — Telephone Encounter (Signed)
Patient Ronald Burton that he feels like he has another yeast infection in his stomach and requesting the medication you put him on last time.

## 2019-09-29 NOTE — Telephone Encounter (Signed)
Called patient back and had to leave him a voicemail letting know that since Dr. Maximino Greenland had not seen him since October 29,2020, he will need to schedule an appointment to come in.

## 2019-10-01 ENCOUNTER — Other Ambulatory Visit: Payer: Self-pay | Admitting: Cardiovascular Disease

## 2019-10-08 ENCOUNTER — Other Ambulatory Visit: Payer: Self-pay | Admitting: Gastroenterology

## 2019-10-24 ENCOUNTER — Ambulatory Visit (INDEPENDENT_AMBULATORY_CARE_PROVIDER_SITE_OTHER): Payer: 59 | Admitting: Gastroenterology

## 2019-10-24 ENCOUNTER — Other Ambulatory Visit: Payer: Self-pay

## 2019-10-24 ENCOUNTER — Encounter: Payer: Self-pay | Admitting: Gastroenterology

## 2019-10-24 VITALS — BP 134/84 | HR 85 | Temp 98.0°F | Ht 69.0 in | Wt 195.0 lb

## 2019-10-24 DIAGNOSIS — R109 Unspecified abdominal pain: Secondary | ICD-10-CM

## 2019-10-24 MED ORDER — DEXLANSOPRAZOLE 60 MG PO CPDR: 60.0000 mg | DELAYED_RELEASE_CAPSULE | Freq: Every day | ORAL | 1 refills | Status: DC

## 2019-10-24 NOTE — Progress Notes (Signed)
Melodie Bouillon, MD [address]  [address]  [place], [place] [address]  Main: [phone]  Fax: [phone]   Primary Care Physician: Doreene Nest, NP   Chief Complaint  Patient presents with  . Abdominal Pain    HPI: Ronald Burton is a 49 y.o. male Follow-up of abdominal bloating with previous EGD showing chronic gastritis with negative H. pylori.  Was also found to have yeast in esophagus and fluconazole prescribed at that time.  Omeprazole prescribed on last visit in August 2020.  Patient continues to have some abdominal bloating, epigastric, no nausea or vomiting, no dysphagia.  Is taking omeprazole 40 mg once daily.  Twice daily was not covered by insurance.  Patient reports his colonoscopies are done at the [place].  Last one was about 3 years ago, and they recommended another one in 5 years from last month.  We were not able to obtain his records  Current Outpatient Medications  Medication Sig Dispense Refill  . aspirin EC 81 MG tablet Take 81 mg by mouth daily at 12 noon.     . busPIRone (BUSPAR) 15 MG tablet Take 15 mg by mouth 3 (three) times daily.    . cetirizine (ZYRTEC) 10 MG tablet Take 10 mg by mouth daily.    . diclofenac sodium (VOLTAREN) 1 % GEL Apply 2-4 g topically daily as needed (for knee pain).     . Famotidine (PEPCID AC PO) Take by mouth as needed.    . Fluticasone Propionate (XHANCE) 93 MCG/ACT EXHU Place 2 Doses into both nostrils 2 (two) times daily. 32 mL 0  . hydrochlorothiazide (HYDRODIURIL) 12.5 MG tablet TAKE 1 TABLET (12.5 MG TOTAL) BY MOUTH EVERY OTHER DAY. FOR BLOOD PRESSURE. 15 tablet 5  . ipratropium (ATROVENT) 0.03 % nasal spray Place 2 sprays into both nostrils 4 (four) times daily. 30 mL 5  . ketotifen (ZADITOR) 0.025 % ophthalmic solution Place 1 drop into both eyes 2 (two) times daily. 5 mL 5  . levalbuterol (XOPENEX HFA) 45 MCG/ACT inhaler TAKE 2 PUFFS BY MOUTH EVERY 6 HOURS AS NEEDED FOR WHEEZE 15 Inhaler 2  . metoprolol  succinate (TOPROL-XL) 50 MG 24 hr tablet TAKE 50 MG IN THE MORNING AND 50 MG IN THE EVENING 60 tablet 8  . omeprazole (PRILOSEC) 40 MG capsule TAKE 1 CAPSULE BY MOUTH EVERY DAY 30 capsule 1  . ondansetron (ZOFRAN ODT) 4 MG disintegrating tablet Take 1 tablet (4 mg total) by mouth every 8 (eight) hours as needed for nausea or vomiting. 15 tablet 0  . simvastatin (ZOCOR) 20 MG tablet TAKE 1 TABLET (20 MG TOTAL) BY MOUTH EVERY EVENING. FOR CHOLESTEROL. 90 tablet 2  . topiramate (TOPAMAX) 25 MG tablet Take 25 mg by mouth daily as needed (at onset of headaches).     Marland Kitchen UNABLE TO FIND CPAP: At bedtime     No current facility-administered medications for this visit.    Allergies as of 10/24/2019 - Review Complete 10/24/2019  Allergen Reaction Noted  . Paxil [paroxetine] Other (See Comments) 01/14/2017    ROS:  General: Negative for anorexia, weight loss, fever, chills, fatigue, weakness. ENT: Negative for hoarseness, difficulty swallowing , nasal congestion. CV: Negative for chest pain, angina, palpitations, dyspnea on exertion, peripheral edema.  Respiratory: Negative for dyspnea at rest, dyspnea on exertion, cough, sputum, wheezing.  GI: See history of present illness. GU:  Negative for dysuria, hematuria, urinary incontinence, urinary frequency, nocturnal urination.  Endo: Negative for unusual weight change.  Physical Examination:   BP 134/84   Pulse 85   Temp 98 F (36.7 C) (Oral)   Ht 5\' 9"  (1.753 m)   Wt 195 lb (88.5 kg)   BMI 28.80 kg/m   General: Well-nourished, well-developed in no acute distress.  Eyes: No icterus. Conjunctivae pink. Mouth: Oropharyngeal mucosa moist and pink , no lesions erythema or exudate. Neck: Supple, Trachea midline Abdomen: Bowel sounds are normal, nontender, nondistended, no hepatosplenomegaly or masses, no abdominal bruits or hernia , no rebound or guarding.   Extremities: No lower extremity edema. No clubbing or deformities. Neuro: Alert and  oriented x 3.  Grossly intact. Skin: Warm and dry, no jaundice.   Psych: Alert and cooperative, normal mood and affect.   Labs: CMP     Component Value Date/Time   NA 137 10/25/2018 0839   K 3.7 10/25/2018 0839   CL 101 10/25/2018 0839   CO2 26 10/25/2018 0839   GLUCOSE 105 (H) 10/25/2018 0839   BUN 11 10/25/2018 0839   CREATININE 1.30 (H) 05/11/2019 1058   CALCIUM 9.4 10/25/2018 0839   PROT 7.9 08/22/2018 1452   ALBUMIN 4.2 08/22/2018 1452   AST 21 08/22/2018 1452   ALT 25 08/22/2018 1452   ALKPHOS 81 08/22/2018 1452   BILITOT 1.0 08/22/2018 1452   GFRNONAA >60 08/22/2018 1452   GFRAA >60 08/22/2018 1452   Lab Results  Component Value Date   WBC 12.4 (H) 08/22/2018   HGB 15.6 08/22/2018   HCT 45.2 08/22/2018   MCV 81.4 08/22/2018   PLT 279 08/22/2018    Imaging Studies: No results found.  Assessment and Plan:   Ronald Burton is a 49 y.o. y/o male here for follow-up of abdominal bloating with continued symptoms despite PPI   Change to Dexilant, As higher dose omeprazole was not covered by pharmacy  (Risks of PPI use were discussed with patient including bone loss, C. Diff diarrhea, pneumonia, infections, CKD, electrolyte abnormalities.  Pt. Verbalizes understanding and chooses to continue the medication.)  [facility].  Patient encouraged to follow-up with them in this regard.  Due to previous history of yeast in the esophagus, will rule out HIV.  Patient agreeable to testing  Dr Vonda Antigua

## 2019-10-24 NOTE — Patient Instructions (Signed)
Please STOP taking Omeprazole and start taking Dexilant once a day.

## 2019-10-25 LAB — HIV ANTIBODY (ROUTINE TESTING W REFLEX): HIV Screen 4th Generation wRfx: NONREACTIVE

## 2019-10-25 IMAGING — CT CT ANGIOGRAPHY CHEST
2 of 6 series · 19 of 46 positions shown · IV contrast (omnipaque)
Comparison: None.

CLINICAL DATA: Palpitations

EXAM:
CT ANGIOGRAPHY CHEST WITH CONTRAST
TECHNIQUE: Multidetector CT imaging of the chest was performed using the
standard protocol during bolus administration of intravenous
contrast. Multiplanar CT image reconstructions and MIPs were
obtained to evaluate the vascular anatomy.
CONTRAST:  75mL OMNIPAQUE IOHEXOL 350 MG/ML SOLN

[Series 5: thins · axial · 0.70mm/px · z∈[-641,-403]mm · 16 of 262 slices shown]
[im 12/262  lung]
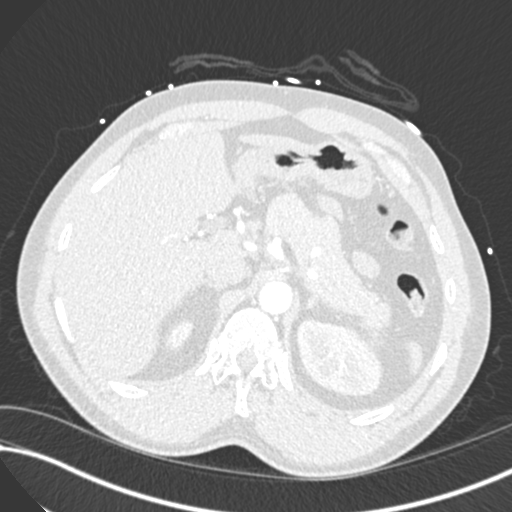
[im 35/262  soft-tissue]
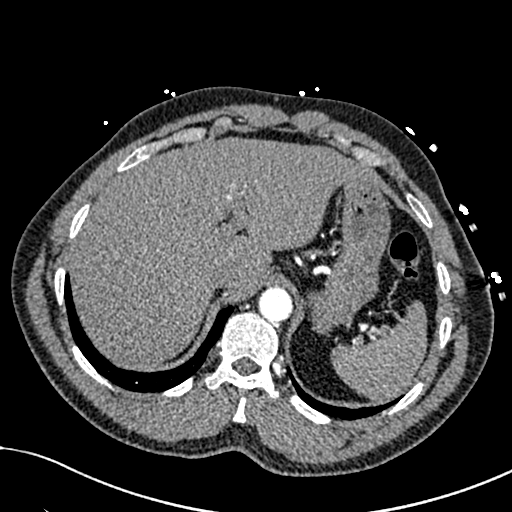
[im 46/262  lung]
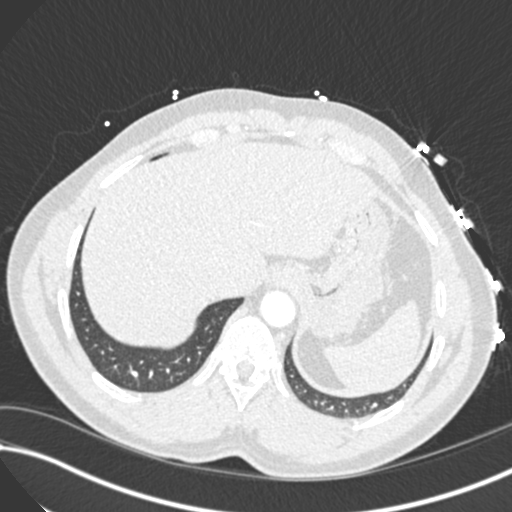
[im 57/262  soft-tissue]
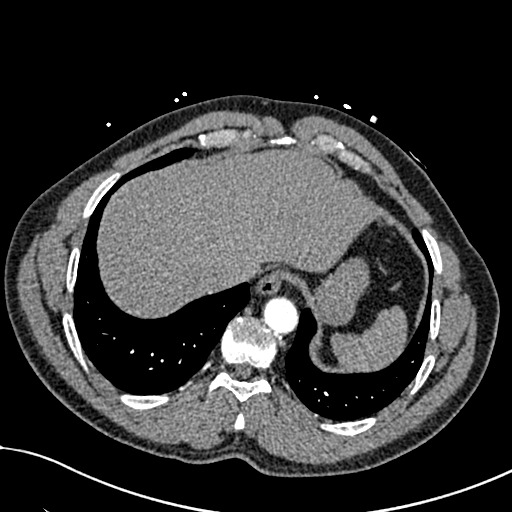
[im 80/262  lung]
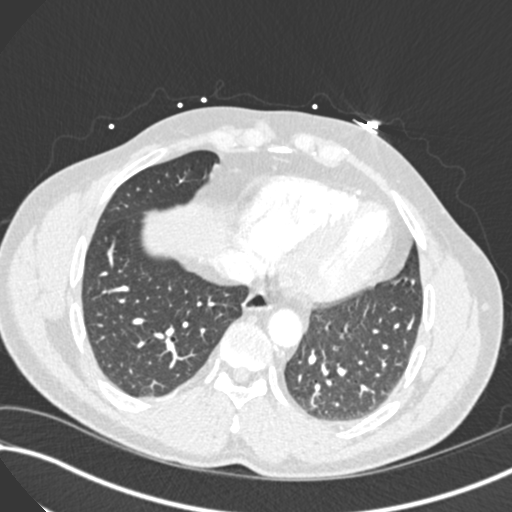
[im 91/262  soft-tissue]
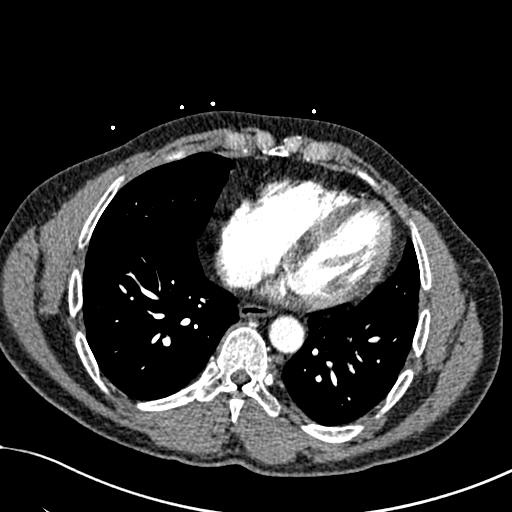
[im 103/262  lung]
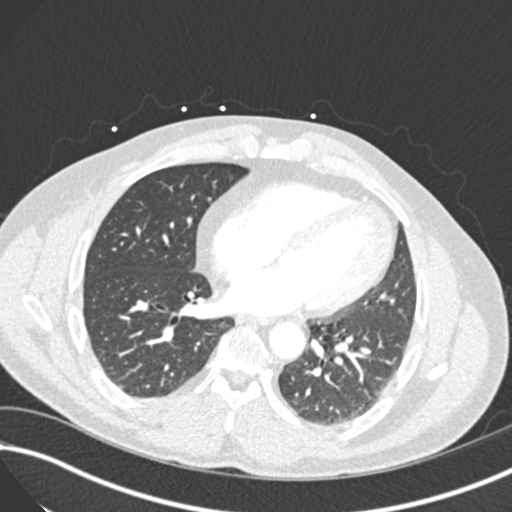
[im 125/262  soft-tissue]
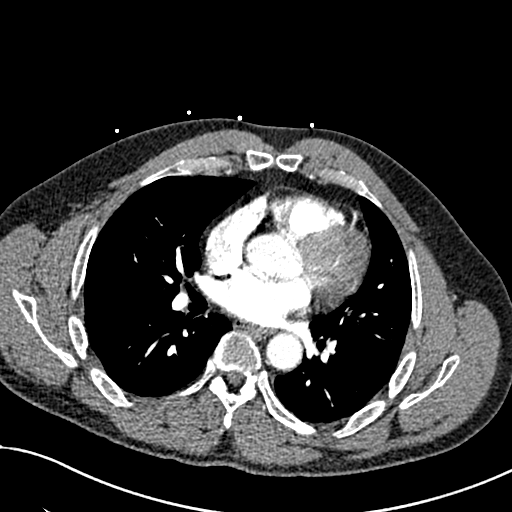
[im 137/262  lung]
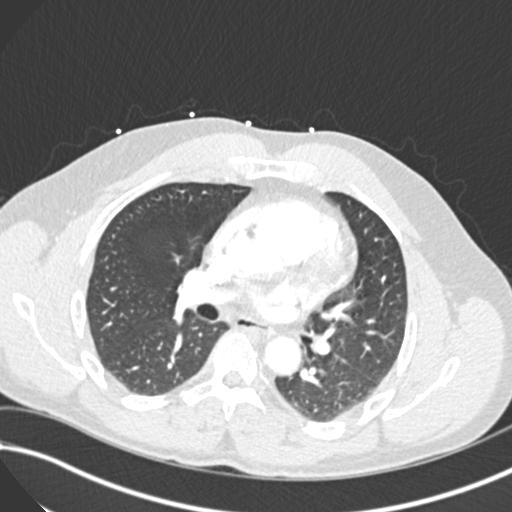
[im 159/262  soft-tissue]
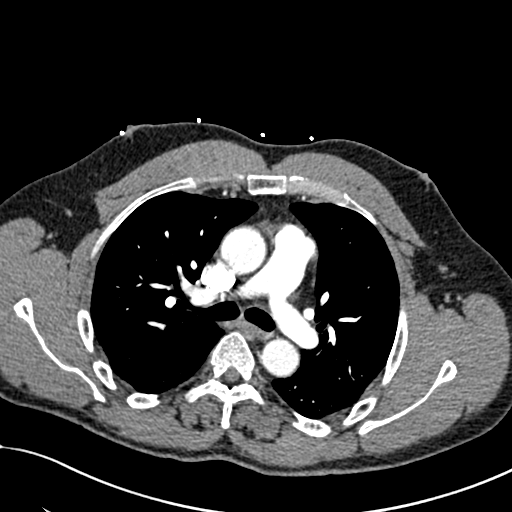
[im 171/262  lung]
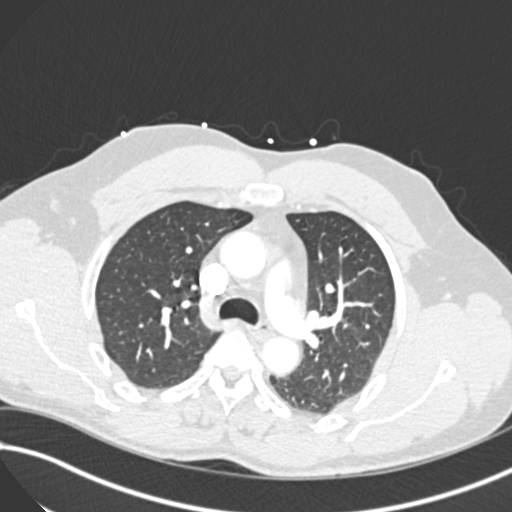
[im 182/262  soft-tissue]
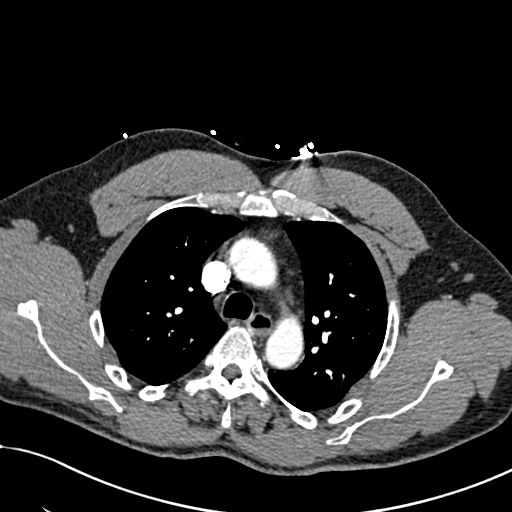
[im 205/262  lung]
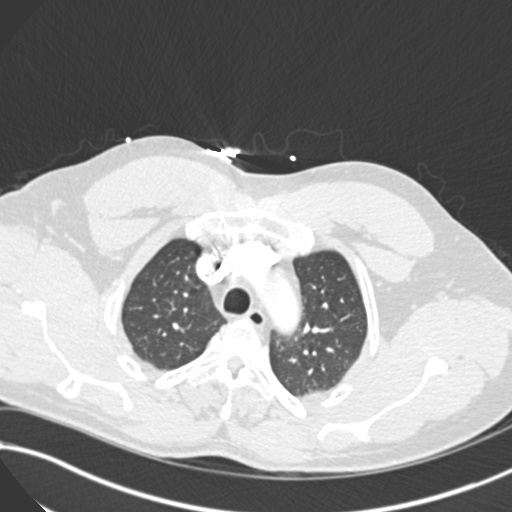
[im 216/262  soft-tissue]
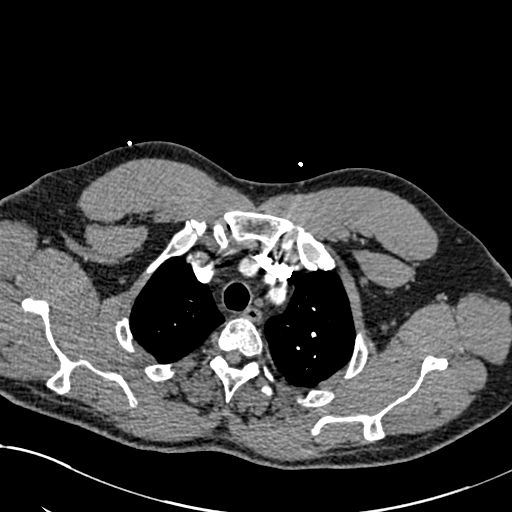
[im 227/262  lung]
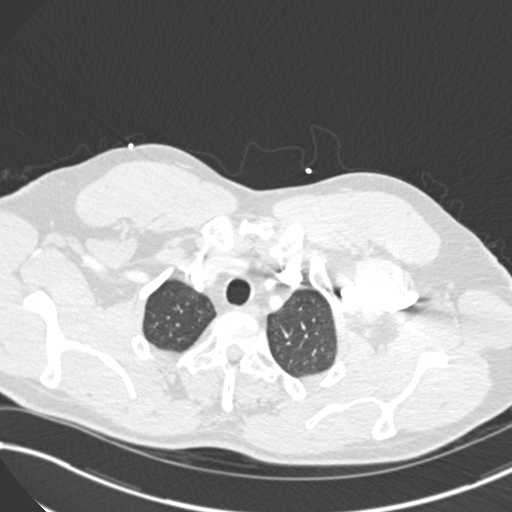
[im 250/262  soft-tissue]
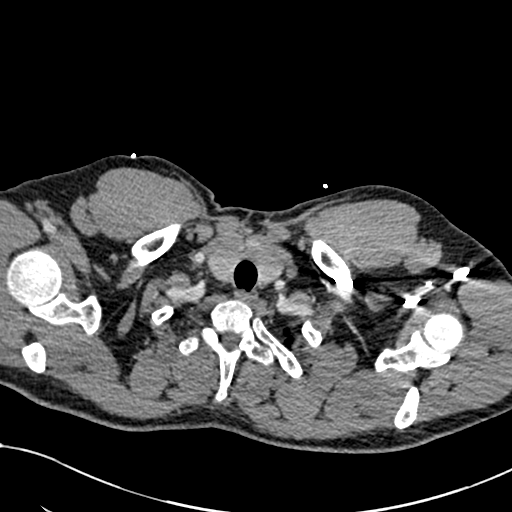

[Series 7: coronal mpr · coronal · 0.54mm/px · 3 of 89 slices shown]
[im 23/89  soft-tissue]
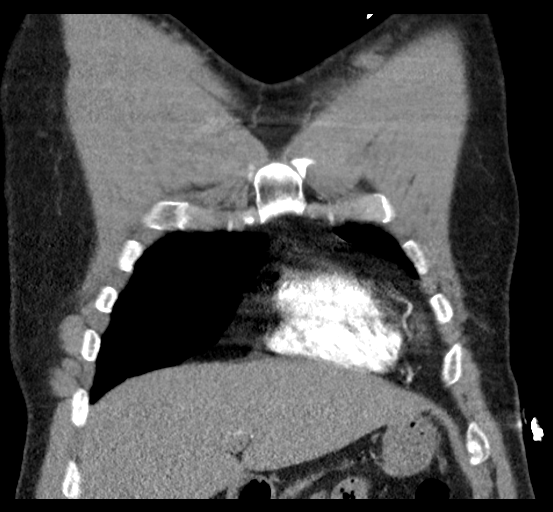
[im 45/89  soft-tissue]
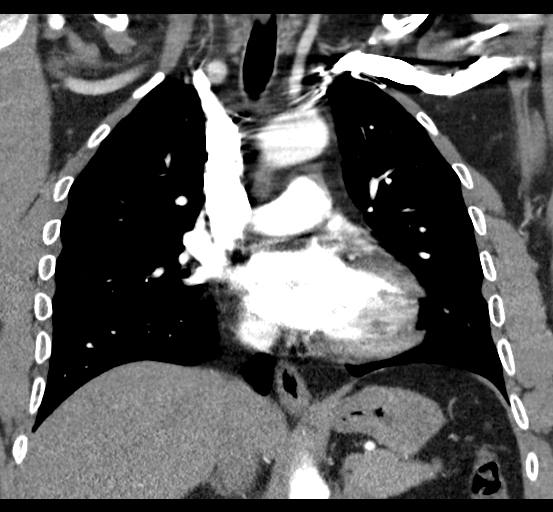
[im 67/89  soft-tissue]
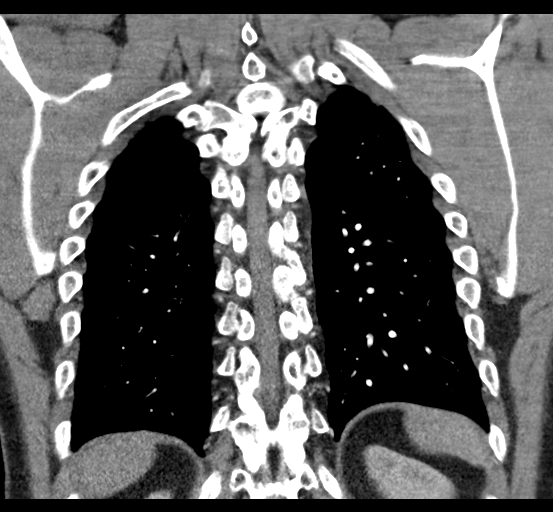

[19 of 46 positions shown; findings below may reference images not displayed]

FINDINGS: Cardiovascular: Satisfactory opacification of the pulmonary arteries
to the segmental level. No evidence of pulmonary embolism. Normal
heart size. No pericardial effusion.

Mediastinum/Nodes: No enlarged mediastinal, hilar, or axillary lymph
nodes. Thyroid gland, trachea, and esophagus demonstrate no
significant findings.

Lungs/Pleura: Lungs are clear. No pleural effusion or pneumothorax.

Upper Abdomen: No acute abnormality.

Musculoskeletal: Negative

Review of the MIP images confirms the above findings.
IMPRESSION: Negative chest CTA.

## 2019-11-07 ENCOUNTER — Telehealth: Payer: Self-pay

## 2019-11-07 NOTE — Telephone Encounter (Signed)
Noted, will evaluate. 

## 2019-11-07 NOTE — Telephone Encounter (Signed)
Pt said for 3 days H/A in top of head that is constant; also some pain behind both eyes. Pain level 6-7. BP this morning 7:20 AM 127/96 P 75. Pt has some nausea and lightheadedness on and off. Pt was moving some furniture this weekend and has generalized muscle pain. For 2 yrs pt has SOB on and off with indigestion and GERD. Last time 11/05/19.No CP. Pt has no covid symptoms other than H/A and muscle pain (see above), no travel and no known exposure to + covid. Offered pt appt today with different provider but pt prefers to see Allayne Gitelman NP on 11/08/19 at 8:40. UC & ED precautions given and pt voiced understanding. Pt will bring BP log with him. FYI to Allayne Gitelman NP.

## 2019-11-08 ENCOUNTER — Ambulatory Visit (INDEPENDENT_AMBULATORY_CARE_PROVIDER_SITE_OTHER): Payer: 59 | Admitting: Primary Care

## 2019-11-08 ENCOUNTER — Encounter: Payer: Self-pay | Admitting: Primary Care

## 2019-11-08 ENCOUNTER — Other Ambulatory Visit: Payer: Self-pay

## 2019-11-08 DIAGNOSIS — I10 Essential (primary) hypertension: Secondary | ICD-10-CM | POA: Diagnosis not present

## 2019-11-08 NOTE — Assessment & Plan Note (Signed)
Well controlled in the office today, no alarm signs. Discussed to limit frequency of BP checks.  Continue HCTZ 12.5 mg every other day. Continue metoprolol succinate daily.  We will see him in a few weeks for his CPE.

## 2019-11-08 NOTE — Patient Instructions (Signed)
Continue hydrochlorothiazide 12.5 mg every other day for blood pressure.  Continue metoprolol succinate daily for palpitations and blood pressure.  Continue exercising. You should be getting 150 minutes of moderate intensity exercise weekly.  It was a pleasure to see you today!

## 2019-11-08 NOTE — Progress Notes (Signed)
Subjective:    Patient ID: Ronald Burton, male    DOB: 09-15-1970, 49 y.o.   MRN: [id number]  HPI  This visit occurred during the SARS-CoV-2 public health emergency.  Safety protocols were in place, including screening questions prior to the visit, additional usage of staff PPE, and extensive cleaning of exam room while observing appropriate contact time as indicated for disinfecting solutions.   Ronald Burton is a 49 year old male with a history of GERD, unstable angina, hypertension, OSA, recurrent sinusitis, tobacco abuse, prediabetes, palpitations who presents today with a chief complaint of hypertension.  He's been checking his blood pressure four times daily over the last three days at home and was getting readings of 130's-150's/90's-100's.  He suspects his blood pressure was elevated due to a nagging sinus headache that began a few days ago . He took a few pills of his prednisone and today has noticed nearly complete resolve in his sinus headache.   He has been exercising more with his bicycle, sometimes walking. He also resumed his HCTZ 12.5 mg every other day, compliant to metoprolol succinate. Denies chest pain, continues to struggle with GERD. He has recently resumed his Dexilant and has noticed more of an improvement, still breakthrough symptoms.   BP Readings from Last 3 Encounters:  11/08/19 124/70  10/24/19 134/84  07/29/19 110/70    Review of Systems  HENT: Positive for sinus pressure.   Eyes: Negative for visual disturbance.  Respiratory: Negative for shortness of breath.   Cardiovascular: Negative for chest pain.  Allergic/Immunologic: Positive for environmental allergies.  Neurological: Negative for dizziness.       Past Medical History:  Diagnosis Date  . Anxiety   . Arthritis    knees  . BPH (benign prostatic hyperplasia)   . Complication of anesthesia    DIFFICULTY WAKING AFTER SINUS SURGERY  . Depression   . Deviated septum   . GERD (gastroesophageal  reflux disease)   . Headache   . Hyperlipidemia   . Hypertension   . Irregular heart beat   . Migraines   . OSA (obstructive sleep apnea)    does not use CPAP  . Palpitations   . Seasonal allergies   . Tinnitus   . Urine incontinence      Social History   Socioeconomic History  . Marital status: Married    Spouse name: Not on file  . Number of children: Not on file  . Years of education: Not on file  . Highest education level: Not on file  Occupational History  . Occupation: Production designer, theatre/television/film for [employer]  Tobacco Use  . Smoking status: Never Smoker  . Smokeless tobacco: Current User    Types: Snuff  . Tobacco comment: he does need a patch  Vaping Use  . Vaping Use: Never used  Substance and Sexual Activity  . Alcohol use: Not Currently    Comment: social  . Drug use: No  . Sexual activity: Not on file  Other Topics Concern  . Not on file  Social History Narrative   Married.   6 children, 1 grandchild.   Works as a Art therapist   Enjoys spending time with family, video games.    Social Determinants of Health   Financial Resource Strain:   . Difficulty of Paying Living Expenses:   Food Insecurity:   . Worried About Programme researcher, broadcasting/film/video in the Last Year:   . Barista in the Last Year:   Transportation Needs:   .  Lack of Transportation (Medical):   Marland Kitchen Lack of Transportation (Non-Medical):   Physical Activity:   . Days of Exercise per Week:   . Minutes of Exercise per Session:   Stress:   . Feeling of Stress :   Social Connections:   . Frequency of Communication with Friends and Family:   . Frequency of Social Gatherings with Friends and Family:   . Attends Religious Services:   . Active Member of Clubs or Organizations:   . Attends Banker Meetings:   Marland Kitchen Marital Status:   Intimate Partner Violence:   . Fear of Current or Ex-Partner:   . Emotionally Abused:   Marland Kitchen Physically Abused:   . Sexually Abused:     Past Surgical History:  Procedure  Laterality Date  . ESOPHAGOGASTRODUODENOSCOPY (EGD) WITH PROPOFOL N/A 12/16/2018   Procedure: ESOPHAGOGASTRODUODENOSCOPY (EGD) WITH PROPOFOL;  Surgeon: Pasty Spillers, MD;  Location: [facility];  Service: Endoscopy;  Laterality: N/A;  . ETHMOIDECTOMY Bilateral 08/04/2017   Procedure: BILATERAL ETHMOIDECTOMY WITH TISSUE REMOVAL;  Surgeon: Newman Pies, MD;  Location: [facility];  Service: ENT;  Laterality: Bilateral;  . EXCISION CHONCHA BULLOSA Left 08/04/2017   Procedure: LEFT CONCHA BULLOSA RESECTION;  Surgeon: Newman Pies, MD;  Location: [facility];  Service: ENT;  Laterality: Left;  . FRONTAL SINUS EXPLORATION Bilateral 08/04/2017   Procedure: BILATERAL FRONTAL RECESS SINUS EXPLORATION;  Surgeon: Newman Pies, MD;  Location: [facility];  Service: ENT;  Laterality: Bilateral;  . LEFT HEART CATH AND CORONARY ANGIOGRAPHY N/A 10/16/2017   Procedure: LEFT HEART CATH AND CORONARY ANGIOGRAPHY;  Surgeon: Marykay Lex, MD;  Location: [facility];  Service: Cardiovascular;  Laterality: N/A;  . MAXILLARY ANTROSTOMY Bilateral 08/04/2017   Procedure: BILATERAL MAXILLARY ANTROSTOMY WITH TISSUE REMOVAL;  Surgeon: Newman Pies, MD;  Location: [facility];  Service: ENT;  Laterality: Bilateral;  . NASAL SEPTOPLASTY W/ TURBINOPLASTY N/A 08/04/2017   Procedure: NASAL SEPTOPLASTY WITH TURBINATE REDUCTION;  Surgeon: Newman Pies, MD;  Location: [facility];  Service: ENT;  Laterality: N/A;  . SINOSCOPY    . SINUS ENDO WITH FUSION Bilateral 08/04/2017   Procedure: SINUS ENDO WITH FUSION NAVIGATION;  Surgeon: Newman Pies, MD;  Location: [facility];  Service: ENT;  Laterality: Bilateral;  . SPHENOIDECTOMY Bilateral 08/04/2017   Procedure: BILATERAL SPHENOIDECTOMY WITH TISSUE REMOVAL;  Surgeon: Newman Pies, MD;  Location:  SURGERY CENTER;  Service: ENT;  Laterality: Bilateral;    Family History  Problem Relation Age of Onset  .  Arthritis Mother   . Hyperlipidemia Mother   . Hypertension Mother   . Stroke Mother   . Diabetes Mother   . Stomach cancer Mother   . Hyperlipidemia Father   . Heart disease Father 16  . Hypertension Father     Allergies  Allergen Reactions  . Paxil [Paroxetine] Other (See Comments)    Induced panic    Current Outpatient Medications on File Prior to Visit  Medication Sig Dispense Refill  . aspirin EC 81 MG tablet Take 81 mg by mouth daily at 12 noon.     . busPIRone (BUSPAR) 15 MG tablet Take 15 mg by mouth 3 (three) times daily.    . cetirizine (ZYRTEC) 10 MG tablet Take 10 mg by mouth daily.    Marland Kitchen dexlansoprazole (DEXILANT) 60 MG capsule Take 1 capsule (60 mg total) by mouth daily. 30 capsule 1  . diclofenac sodium (VOLTAREN) 1 %  GEL Apply 2-4 g topically daily as needed (for knee pain).     . Famotidine (PEPCID AC PO) Take by mouth as needed.    . Fluticasone Propionate (XHANCE) 93 MCG/ACT EXHU Place 2 Doses into both nostrils 2 (two) times daily. 32 mL 0  . ipratropium (ATROVENT) 0.03 % nasal spray Place 2 sprays into both nostrils 4 (four) times daily. 30 mL 5  . ketotifen (ZADITOR) 0.025 % ophthalmic solution Place 1 drop into both eyes 2 (two) times daily. 5 mL 5  . levalbuterol (XOPENEX HFA) 45 MCG/ACT inhaler TAKE 2 PUFFS BY MOUTH EVERY 6 HOURS AS NEEDED FOR WHEEZE 15 Inhaler 2  . metoprolol succinate (TOPROL-XL) 50 MG 24 hr tablet TAKE 50 MG IN THE MORNING AND 50 MG IN THE EVENING 60 tablet 8  . omeprazole (PRILOSEC) 40 MG capsule TAKE 1 CAPSULE BY MOUTH EVERY DAY 30 capsule 1  . simvastatin (ZOCOR) 20 MG tablet TAKE 1 TABLET (20 MG TOTAL) BY MOUTH EVERY EVENING. FOR CHOLESTEROL. 90 tablet 2  . topiramate (TOPAMAX) 25 MG tablet Take 25 mg by mouth daily as needed (at onset of headaches).     Marland Kitchen UNABLE TO FIND CPAP: At bedtime    . hydrochlorothiazide (HYDRODIURIL) 12.5 MG tablet TAKE 1 TABLET (12.5 MG TOTAL) BY MOUTH EVERY OTHER DAY. FOR BLOOD PRESSURE. (Patient not taking:  Reported on 11/08/2019) 15 tablet 5   No current facility-administered medications on file prior to visit.    BP 124/70   Pulse 84   Temp (!) 96.4 F (35.8 C) (Temporal)   Ht 5\' 9"  (1.753 m)   Wt 193 lb 4 oz (87.7 kg)   SpO2 98%   BMI 28.54 kg/m    Objective:   Physical Exam Cardiovascular:     Rate and Rhythm: Normal rate and regular rhythm.  Pulmonary:     Effort: Pulmonary effort is normal.     Breath sounds: Normal breath sounds.  Musculoskeletal:     Cervical back: Neck supple.  Skin:    General: Skin is warm and dry.            Assessment & Plan:

## 2019-11-09 ENCOUNTER — Other Ambulatory Visit: Payer: Self-pay | Admitting: Primary Care

## 2019-11-09 DIAGNOSIS — Z125 Encounter for screening for malignant neoplasm of prostate: Secondary | ICD-10-CM

## 2019-11-09 DIAGNOSIS — Z1159 Encounter for screening for other viral diseases: Secondary | ICD-10-CM

## 2019-11-09 DIAGNOSIS — I10 Essential (primary) hypertension: Secondary | ICD-10-CM

## 2019-11-09 DIAGNOSIS — R7303 Prediabetes: Secondary | ICD-10-CM

## 2019-11-23 ENCOUNTER — Other Ambulatory Visit: Payer: Self-pay

## 2019-11-23 ENCOUNTER — Other Ambulatory Visit (INDEPENDENT_AMBULATORY_CARE_PROVIDER_SITE_OTHER): Payer: 59

## 2019-11-23 DIAGNOSIS — Z125 Encounter for screening for malignant neoplasm of prostate: Secondary | ICD-10-CM | POA: Diagnosis not present

## 2019-11-23 DIAGNOSIS — R7303 Prediabetes: Secondary | ICD-10-CM

## 2019-11-23 DIAGNOSIS — Z1159 Encounter for screening for other viral diseases: Secondary | ICD-10-CM

## 2019-11-23 DIAGNOSIS — I10 Essential (primary) hypertension: Secondary | ICD-10-CM | POA: Diagnosis not present

## 2019-11-23 LAB — LIPID PANEL
Cholesterol: 164 mg/dL (ref 0–200)
HDL: 43.1 mg/dL (ref >39.00)
LDL Cholesterol: 101 mg/dL — ABNORMAL HIGH (ref 0–99)
NonHDL: 120.96
Total CHOL/HDL Ratio: 4
Triglycerides: 102 mg/dL (ref 0.0–149.0)
VLDL: 20.4 mg/dL (ref 0.0–40.0)

## 2019-11-23 LAB — CBC
HCT: 47.1 % (ref 39.0–52.0)
Hemoglobin: 15.7 g/dL (ref 13.0–17.0)
MCHC: 33.4 g/dL (ref 30.0–36.0)
MCV: 83.7 fl (ref 78.0–100.0)
Platelets: 271 10*3/uL (ref 150.0–400.0)
RBC: 5.63 Mil/uL (ref 4.22–5.81)
RDW: 13.5 % (ref 11.5–15.5)
WBC: 17.5 10*3/uL — ABNORMAL HIGH (ref 4.0–10.5)

## 2019-11-23 LAB — COMPREHENSIVE METABOLIC PANEL
ALT: 44 U/L (ref 0–53)
AST: 19 U/L (ref 0–37)
Albumin: 4.4 g/dL (ref 3.5–5.2)
Alkaline Phosphatase: 80 U/L (ref 39–117)
BUN: 14 mg/dL (ref 6–23)
CO2: 27 mEq/L (ref 19–32)
Calcium: 9.5 mg/dL (ref 8.4–10.5)
Chloride: 101 mEq/L (ref 96–112)
Creatinine, Ser: 1.13 mg/dL (ref 0.40–1.50)
GFR: 82.91 mL/min (ref >60.00)
Glucose, Bld: 88 mg/dL (ref 70–99)
Potassium: 4 mEq/L (ref 3.5–5.1)
Sodium: 137 mEq/L (ref 135–145)
Total Bilirubin: 1.1 mg/dL (ref 0.2–1.2)
Total Protein: 7.1 g/dL (ref 6.0–8.3)

## 2019-11-23 LAB — HEMOGLOBIN A1C: Hgb A1c MFr Bld: 5.5 % (ref 4.6–6.5)

## 2019-11-23 LAB — PSA: PSA: 0.53 ng/mL (ref 0.10–4.00)

## 2019-11-24 LAB — HEPATITIS C ANTIBODY
Hepatitis C Ab: NONREACTIVE
SIGNAL TO CUT-OFF: 0.01 (ref <1.00)

## 2019-11-28 ENCOUNTER — Encounter: Payer: 59 | Admitting: Primary Care

## 2019-11-29 ENCOUNTER — Encounter: Payer: 59 | Admitting: Primary Care

## 2019-11-29 DIAGNOSIS — Z0289 Encounter for other administrative examinations: Secondary | ICD-10-CM

## 2019-11-30 ENCOUNTER — Other Ambulatory Visit: Payer: Self-pay | Admitting: Cardiovascular Disease

## 2019-11-30 DIAGNOSIS — I10 Essential (primary) hypertension: Secondary | ICD-10-CM

## 2019-12-03 ENCOUNTER — Other Ambulatory Visit: Payer: Self-pay | Admitting: Primary Care

## 2019-12-03 DIAGNOSIS — E7849 Other hyperlipidemia: Secondary | ICD-10-CM

## 2019-12-18 ENCOUNTER — Other Ambulatory Visit: Payer: Self-pay | Admitting: Gastroenterology

## 2020-01-14 ENCOUNTER — Other Ambulatory Visit: Payer: Self-pay | Admitting: Gastroenterology

## 2020-05-25 ENCOUNTER — Telehealth: Payer: Self-pay

## 2020-05-25 NOTE — Telephone Encounter (Signed)
Unable to reach patient. Patient doesn't have voice mail set up.

## 2020-05-25 NOTE — Telephone Encounter (Signed)
Pt called to inquire of how much office visit is for cash paying visit
